# Patient Record
Sex: Female | Born: 1975
Health system: Southern US, Community
[De-identification: ages and names within clinical notes are randomized; demographics above are authoritative.]

## PROBLEM LIST (undated history)

## (undated) DIAGNOSIS — D219 Benign neoplasm of connective and other soft tissue, unspecified: Secondary | ICD-10-CM

## (undated) DIAGNOSIS — K819 Cholecystitis, unspecified: Secondary | ICD-10-CM

## (undated) DIAGNOSIS — F419 Anxiety disorder, unspecified: Secondary | ICD-10-CM

## (undated) DIAGNOSIS — I10 Essential (primary) hypertension: Secondary | ICD-10-CM

## (undated) HISTORY — DX: Essential (primary) hypertension: I10

## (undated) HISTORY — PX: TUBAL LIGATION: SHX77

## (undated) HISTORY — DX: Cholecystitis, unspecified: K81.9

## (undated) HISTORY — DX: Anxiety disorder, unspecified: F41.9

---

## 2002-11-22 ENCOUNTER — Encounter: Admission: RE | Admit: 2002-11-22 | Discharge: 2003-02-20 | Payer: Self-pay | Admitting: Obstetrics and Gynecology

## 2003-02-04 ENCOUNTER — Emergency Department (HOSPITAL_COMMUNITY): Admission: EM | Admit: 2003-02-04 | Discharge: 2003-02-05 | Payer: Self-pay | Admitting: Emergency Medicine

## 2003-04-18 ENCOUNTER — Ambulatory Visit (HOSPITAL_COMMUNITY): Admission: RE | Admit: 2003-04-18 | Discharge: 2003-04-18 | Payer: Self-pay | Admitting: *Deleted

## 2003-07-16 ENCOUNTER — Ambulatory Visit (HOSPITAL_COMMUNITY): Admission: RE | Admit: 2003-07-16 | Discharge: 2003-07-16 | Payer: Self-pay | Admitting: Obstetrics

## 2003-08-24 ENCOUNTER — Ambulatory Visit (HOSPITAL_COMMUNITY): Admission: RE | Admit: 2003-08-24 | Discharge: 2003-08-24 | Payer: Self-pay | Admitting: *Deleted

## 2003-08-27 ENCOUNTER — Encounter: Admission: RE | Admit: 2003-08-27 | Discharge: 2003-08-27 | Payer: Self-pay | Admitting: *Deleted

## 2003-08-31 ENCOUNTER — Inpatient Hospital Stay (HOSPITAL_COMMUNITY): Admission: AD | Admit: 2003-08-31 | Discharge: 2003-08-31 | Payer: Self-pay | Admitting: *Deleted

## 2003-09-05 ENCOUNTER — Encounter: Admission: RE | Admit: 2003-09-05 | Discharge: 2003-09-05 | Payer: Self-pay | Admitting: *Deleted

## 2003-09-05 ENCOUNTER — Ambulatory Visit (HOSPITAL_COMMUNITY): Admission: RE | Admit: 2003-09-05 | Discharge: 2003-09-05 | Payer: Self-pay | Admitting: *Deleted

## 2003-09-12 ENCOUNTER — Ambulatory Visit (HOSPITAL_COMMUNITY): Admission: RE | Admit: 2003-09-12 | Discharge: 2003-09-12 | Payer: Self-pay | Admitting: *Deleted

## 2003-09-12 ENCOUNTER — Encounter: Admission: RE | Admit: 2003-09-12 | Discharge: 2003-09-12 | Payer: Self-pay | Admitting: *Deleted

## 2003-09-18 ENCOUNTER — Ambulatory Visit (HOSPITAL_COMMUNITY): Admission: RE | Admit: 2003-09-18 | Discharge: 2003-09-18 | Payer: Self-pay | Admitting: *Deleted

## 2003-09-18 ENCOUNTER — Encounter: Admission: RE | Admit: 2003-09-18 | Discharge: 2003-09-18 | Payer: Self-pay | Admitting: Obstetrics and Gynecology

## 2003-09-20 ENCOUNTER — Encounter (INDEPENDENT_AMBULATORY_CARE_PROVIDER_SITE_OTHER): Payer: Self-pay | Admitting: *Deleted

## 2003-09-20 ENCOUNTER — Inpatient Hospital Stay (HOSPITAL_COMMUNITY): Admission: EM | Admit: 2003-09-20 | Discharge: 2003-09-23 | Payer: Self-pay | Admitting: *Deleted

## 2003-09-25 ENCOUNTER — Inpatient Hospital Stay (HOSPITAL_COMMUNITY): Admission: AD | Admit: 2003-09-25 | Discharge: 2003-09-25 | Payer: Self-pay | Admitting: *Deleted

## 2004-05-04 ENCOUNTER — Emergency Department (HOSPITAL_COMMUNITY): Admission: EM | Admit: 2004-05-04 | Discharge: 2004-05-04 | Payer: Self-pay | Admitting: Emergency Medicine

## 2006-04-06 ENCOUNTER — Emergency Department (HOSPITAL_COMMUNITY): Admission: EM | Admit: 2006-04-06 | Discharge: 2006-04-06 | Payer: Self-pay | Admitting: Emergency Medicine

## 2009-09-27 ENCOUNTER — Emergency Department (HOSPITAL_COMMUNITY): Admission: EM | Admit: 2009-09-27 | Discharge: 2009-09-27 | Payer: Self-pay | Admitting: Emergency Medicine

## 2010-05-17 LAB — POCT I-STAT, CHEM 8
BUN: 5 mg/dL — ABNORMAL LOW (ref 6–23)
Calcium, Ion: 1.12 mmol/L (ref 1.12–1.32)
Chloride: 107 mEq/L (ref 96–112)
Creatinine, Ser: 0.9 mg/dL (ref 0.4–1.2)
Glucose, Bld: 106 mg/dL — ABNORMAL HIGH (ref 70–99)
HCT: 40 % (ref 36.0–46.0)
Hemoglobin: 13.6 g/dL (ref 12.0–15.0)
Potassium: 3.9 mEq/L (ref 3.5–5.1)
Sodium: 142 mEq/L (ref 135–145)
TCO2: 26 mmol/L (ref 0–100)

## 2010-07-18 NOTE — Op Note (Signed)
Mary Mathis, Mary Mathis                       ACCOUNT NO.:  [id number]   MEDICAL RECORD NO.:  [id number]                   PATIENT TYPE:  INP   LOCATION:  9145                                 FACILITY:  [facility]   PHYSICIAN:  Conni Elliot, M.D.             DATE OF BIRTH:  [birth year]   DATE OF PROCEDURE:  09/20/2003  DATE OF DISCHARGE:  09/23/2003                                 OPERATIVE REPORT   PREOPERATIVE DIAGNOSES:  1. Repeat cesarean delivery.  2. Desire surgical sterilization.  3. Polyhydramnios.   POSTOPERATIVE DIAGNOSES:  1. Repeat cesarean delivery.  2. Desire surgical sterilization.  3. Polyhydramnios.   OPERATION PERFORMED:  1. Low transverse cesarean section.  2. Modified bilateral P[place]oy tubal ligation.   SURGEON:  Conni Elliot, M.D.   ASSISTANTMichele Mcalpine D. Okey Dupre, M.D.   ANESTHESIA:  Spinal.   OPERATIVE FINDINGS:  7 pound 14 ounce female with Apgars 8 and 9.   DESCRIPTION OF PROCEDURE:  After placing the patient supine and given  oxygen, the patient's peritoneum was prepped and draped in sterile fashion.  Low transverse Pfannenstiel incision was made.  Incision was made through  the skin and fascia.  Rectus muscles separated midline, peritoneum entered,  bladder flap made, low transverse uterine incision was made.  Baby was in a  vertex presentation.  Cord doubly clamped and cut and baby handed to  neonatology in attendance.  The  placenta was delivered spontaneously.  Uterus, bladder flap, anterior peritoneum, fascia and subcutaneous skin  closed in an adequate fashion.  Estimated blood loss approximately 800 mL.                                               Conni Elliot, M.D.    ASG/MEDQ  D:  10/16/2003  T:  10/17/2003  Job:  [id number]

## 2010-07-18 NOTE — Discharge Summary (Signed)
Mary Mathis, Mary Mathis                       ACCOUNT NO.:  [id number]   MEDICAL RECORD NO.:  [id number]                   PATIENT TYPE:  INP   LOCATION:  9145                                 FACILITY:  [facility]   PHYSICIAN:  Conni Elliot, M.D.             DATE OF BIRTH:  12-Feb-1976   DATE OF ADMISSION:  09/20/2003  DATE OF DISCHARGE:  09/23/2003                                 DISCHARGE SUMMARY   ADMISSION DIAGNOSES:  70. A 35 year old gravida 4 para 1-0-2-1 at term for a repeat lower     transverse cesarean section.  2. Morbid obesity.   DISCHARGE DIAGNOSES:  33. A 35 year old gravida 4 para 2-0-2-2 postoperative day #3 status post     repeat lower transverse cesarean section and bilateral tubal ligation for     desired sterilization.  2. Viable female with Apgars 8 at one minute and 9 at five minutes.   DISCHARGE MEDICATIONS:  1. Percocet 5/325 one to two p.o. q.4-6h. p.r.n. #30.  2. Ibuprofen 600 mg one p.o. q.6h. p.r.n.  3. Prenatal vitamin one p.o. daily while breastfeeding.   ADMISSION HISTORY:  [name] was admitted to the hospital at term for a  repeat lower transverse C-section with bilateral tubal ligation.  Please see  that operative note.   POSTOPERATIVE COURSE:  [name] had a routine postoperative course.  She  remained afebrile.  Her postoperative hemoglobin was 11.5.  On day of  discharge she was stable but due to her body habitus we will postpone  discontinuing her staples for 2 more days.   CONDITION ON DISCHARGE:  The patient was discharged to home in stable  condition.   INSTRUCTIONS GIVEN TO PATIENT:  The patient was told of her medical regimen.  She is to return to maternity admissions in 2 days for a staple removal and  then 6 weeks to [facility].     Jon Gills, M.D.                     Conni Elliot, M.D.    LC/MEDQ  D:  09/23/2003  T:  09/23/2003  Job:  [id number]

## 2011-01-28 ENCOUNTER — Encounter (HOSPITAL_COMMUNITY): Payer: Self-pay | Admitting: *Deleted

## 2011-01-28 ENCOUNTER — Encounter: Payer: Self-pay | Admitting: *Deleted

## 2011-01-28 ENCOUNTER — Emergency Department (HOSPITAL_COMMUNITY)
Admission: EM | Admit: 2011-01-28 | Discharge: 2011-01-28 | Discharge status: Against Medical Advice | Payer: Self-pay | Attending: Emergency Medicine | Admitting: Emergency Medicine

## 2011-01-28 ENCOUNTER — Emergency Department (HOSPITAL_COMMUNITY): Payer: Self-pay

## 2011-01-28 ENCOUNTER — Emergency Department (HOSPITAL_COMMUNITY)
Admission: EM | Admit: 2011-01-28 | Discharge: 2011-01-28 | Disposition: A | Discharge status: Home / Self Care | Payer: Self-pay | Attending: Emergency Medicine | Admitting: Emergency Medicine

## 2011-01-28 DIAGNOSIS — R197 Diarrhea, unspecified: Secondary | ICD-10-CM | POA: Insufficient documentation

## 2011-01-28 DIAGNOSIS — R109 Unspecified abdominal pain: Secondary | ICD-10-CM | POA: Insufficient documentation

## 2011-01-28 DIAGNOSIS — K8020 Calculus of gallbladder without cholecystitis without obstruction: Secondary | ICD-10-CM | POA: Insufficient documentation

## 2011-01-28 DIAGNOSIS — R112 Nausea with vomiting, unspecified: Secondary | ICD-10-CM | POA: Insufficient documentation

## 2011-01-28 DIAGNOSIS — R1013 Epigastric pain: Secondary | ICD-10-CM | POA: Insufficient documentation

## 2011-01-28 LAB — POCT PREGNANCY, URINE: Preg Test, Ur: NEGATIVE

## 2011-01-28 LAB — CBC
HCT: 40.7 % (ref 36.0–46.0)
Hemoglobin: 13.6 g/dL (ref 12.0–15.0)
MCH: 30 pg (ref 26.0–34.0)
MCHC: 33.4 g/dL (ref 30.0–36.0)
MCV: 89.8 fL (ref 78.0–100.0)
Platelets: 213 10*3/uL (ref 150–400)
RBC: 4.53 MIL/uL (ref 3.87–5.11)
RDW: 12.7 % (ref 11.5–15.5)
WBC: 10.2 10*3/uL (ref 4.0–10.5)

## 2011-01-28 LAB — COMPREHENSIVE METABOLIC PANEL
ALT: 39 U/L — ABNORMAL HIGH (ref 0–35)
AST: 53 U/L — ABNORMAL HIGH (ref 0–37)
Albumin: 3.8 g/dL (ref 3.5–5.2)
Alkaline Phosphatase: 86 U/L (ref 39–117)
BUN: 9 mg/dL (ref 6–23)
CO2: 27 mEq/L (ref 19–32)
Calcium: 9.1 mg/dL (ref 8.4–10.5)
Chloride: 104 mEq/L (ref 96–112)
Creatinine, Ser: 0.85 mg/dL (ref 0.50–1.10)
GFR calc Af Amer: >90 mL/min (ref >90)
GFR calc non Af Amer: 88 mL/min — ABNORMAL LOW (ref >90)
Glucose, Bld: 128 mg/dL — ABNORMAL HIGH (ref 70–99)
Potassium: 3.9 mEq/L (ref 3.5–5.1)
Sodium: 139 mEq/L (ref 135–145)
Total Bilirubin: 0.3 mg/dL (ref 0.3–1.2)
Total Protein: 7.6 g/dL (ref 6.0–8.3)

## 2011-01-28 LAB — URINALYSIS, ROUTINE W REFLEX MICROSCOPIC
Bilirubin Urine: NEGATIVE
Glucose, UA: NEGATIVE mg/dL
Hgb urine dipstick: NEGATIVE
Ketones, ur: NEGATIVE mg/dL
Leukocytes, UA: NEGATIVE
Nitrite: NEGATIVE
Protein, ur: NEGATIVE mg/dL
Specific Gravity, Urine: 1.025 (ref 1.005–1.030)
Urobilinogen, UA: 1 mg/dL (ref 0.0–1.0)
pH: 8 (ref 5.0–8.0)

## 2011-01-28 LAB — DIFFERENTIAL
Basophils Absolute: 0 10*3/uL (ref 0.0–0.1)
Basophils Relative: 0 % (ref 0–1)
Eosinophils Absolute: 0 10*3/uL (ref 0.0–0.7)
Eosinophils Relative: 0 % (ref 0–5)
Lymphocytes Relative: 11 % — ABNORMAL LOW (ref 12–46)
Lymphs Abs: 1.1 10*3/uL (ref 0.7–4.0)
Monocytes Absolute: 0.5 10*3/uL (ref 0.1–1.0)
Monocytes Relative: 5 % (ref 3–12)
Neutro Abs: 8.6 10*3/uL — ABNORMAL HIGH (ref 1.7–7.7)
Neutrophils Relative %: 84 % — ABNORMAL HIGH (ref 43–77)

## 2011-01-28 LAB — LIPASE, BLOOD: Lipase: 58 U/L (ref 11–59)

## 2011-01-28 MED ORDER — ONDANSETRON HCL 4 MG/2ML IJ SOLN
4.0000 mg | Freq: Once | INTRAMUSCULAR | Status: AC
  Administered 2011-01-28: 4 mg via INTRAVENOUS
  Filled 2011-01-28: qty 2

## 2011-01-28 MED ORDER — ONDANSETRON HCL 4 MG PO TABS: ORAL_TABLET | ORAL | Status: DC

## 2011-01-28 MED ORDER — HYDROCODONE-ACETAMINOPHEN 5-500 MG PO TABS: 1.0000 | ORAL_TABLET | Freq: Four times a day (QID) | ORAL | Status: AC | PRN

## 2011-01-28 MED ORDER — FENTANYL CITRATE 0.05 MG/ML IJ SOLN
100.0000 ug | Freq: Once | INTRAMUSCULAR | Status: AC
  Administered 2011-01-28: 100 ug via INTRAVENOUS
  Filled 2011-01-28: qty 2

## 2011-01-28 MED ORDER — SODIUM CHLORIDE 0.9 % IV BOLUS (SEPSIS)
1000.0000 mL | Freq: Once | INTRAVENOUS | Status: AC
  Administered 2011-01-28: 1000 mL via INTRAVENOUS

## 2011-01-28 MED ORDER — HYDROCODONE-ACETAMINOPHEN 5-500 MG PO TABS: 1.0000 | ORAL_TABLET | Freq: Four times a day (QID) | ORAL | Status: DC | PRN

## 2011-01-28 NOTE — ED Notes (Signed)
PT c/o right upper quadrant pain; vomiting. Belly tender but not distended.

## 2011-01-28 NOTE — ED Provider Notes (Signed)
Medical screening examination/treatment/procedure(s) were performed by non-physician practitioner and as supervising physician I was immediately available for consultation/collaboration.  Flint Melter, MD 01/28/11 2107

## 2011-01-28 NOTE — ED Notes (Signed)
C/o abd pain, onset 2 hrs ago, also nv, (denies: bleeding,diarrhea,fever,back pain, urinary or vaginal sx, or other sx), last emesis at 0300, last ate 1930, last BM PTA (normal.

## 2011-01-28 NOTE — ED Notes (Signed)
Pt in c/o epigastric pain and n/v since last night

## 2011-01-28 NOTE — ED Notes (Signed)
Pt reports cannot urinate at this time.  

## 2011-01-28 NOTE — ED Provider Notes (Signed)
History     CSN: [id number] Arrival date & time: 01/28/2011  5:08 AM   None     Chief Complaint  Patient presents with  . Abdominal Pain    (Consider location/radiation/quality/duration/timing/severity/associated sxs/prior treatment) HPI  Patient presents to emergency department with complaint of acute onset epigastric upper Donald pain that began at 3:30 this morning waking her from her sleep. Patient states since onset of pain waxing and waning abdominal pain with pain aggravated by standing and sitting up. Patient notes that since onset of pain 2 episodes of vomiting and a few episodes of loose stool. Also complaining of ongoing nausea. Patient has taken nothing for symptoms prior to arrival. Patient denies history of similar abdominal pain. Patient has history of 2 cesarean sections but denies additional abdominal surgeries. Patient denies fevers, chills, chest pain, shortness breath, dysuria, hematuria, blood in her stool, pelvic pain, dyspareunia, or vaginal discharge. Patient has no known medical problems takes no medicines on a regular basis. LMP Nov 12th as described as normal period.   History reviewed. No pertinent past medical history.  History reviewed. No pertinent past surgical history.  History reviewed. No pertinent family history.  History  Substance Use Topics  . Smoking status: Never Smoker   . Smokeless tobacco: Not on file  . Alcohol Use: No    OB History    Grav Para Term Preterm Abortions TAB SAB Ect Mult Living                  Review of Systems  All other systems reviewed and are negative.    Allergies  Review of patient's allergies indicates no known allergies.  Home Medications  No current outpatient prescriptions on file.  BP 149/99  Pulse 81  Temp(Src) 98.7 F (37.1 C) (Oral)  Resp 17  SpO2 100%  LMP 01/12/2011  Physical Exam  Nursing note and vitals reviewed. Constitutional: She is oriented to person, place, and time. She  appears well-developed and well-nourished. No distress.  HENT:  Head: Normocephalic and atraumatic.  Eyes: Conjunctivae are normal.  Neck: Normal range of motion. Neck supple.  Cardiovascular: Normal rate, regular rhythm, normal heart sounds and intact distal pulses.  Exam reveals no gallop and no friction rub.   No murmur heard. Pulmonary/Chest: Effort normal and breath sounds normal. No respiratory distress. She has no wheezes. She has no rales. She exhibits no tenderness.  Abdominal: Bowel sounds are normal. She exhibits no distension and no mass. There is tenderness in the right upper quadrant and epigastric area. There is no rigidity, no rebound, no guarding and no CVA tenderness.       Moderate tenderness to palpation of right upper quadrant region but no rebound tenderness or peritoneal signs.  Musculoskeletal: Normal range of motion. She exhibits no edema and no tenderness.  Neurological: She is alert and oriented to person, place, and time.  Skin: Skin is warm and dry. No rash noted. She is not diaphoretic. No erythema.  Psychiatric: She has a normal mood and affect. Her behavior is normal.    ED Course  Procedures (including critical care time)  IV zofran, fentanyl and fluids  Labs Reviewed  DIFFERENTIAL - Abnormal; Notable for the following:    Neutrophils Relative 84 (*)    Neutro Abs 8.6 (*)    Lymphocytes Relative 11 (*)    All other components within normal limits  COMPREHENSIVE METABOLIC PANEL - Abnormal; Notable for the following:    Glucose, Bld 128 (*)  AST 53 (*)    ALT 39 (*)    GFR calc non Af Amer 88 (*)    All other components within normal limits  URINALYSIS, ROUTINE W REFLEX MICROSCOPIC - Abnormal; Notable for the following:    Appearance CLOUDY (*)    All other components within normal limits  CBC  LIPASE, BLOOD  POCT PREGNANCY, URINE  POCT PREGNANCY, URINE   US Abdomen Complete  01/28/2011  *RADIOLOGY REPORT*  Clinical Data:  Right upper  quadrant pain  ABDOMINAL ULTRASOUND COMPLETE  Comparison:  None.  Findings:  Gallbladder:  Echogenic shadowing gallstones noted.  Some gallstones appear mobile.  Largest gallstone in the neck region measures 1.2 cm.  There is slight gallbladder wall thickening measuring 5.7 mm.  Small amount of pericholecystic fluid as well. Early cholecystitis not excluded.  Murphy's sign was not elicited over the gallbladder however the patient was apparently medicated prior to the study.  Common Bile Duct:  Within normal limits in caliber.  Liver: No focal mass lesion identified.  Within normal limits in parenchymal echogenicity.  IVC:  Appears normal.  Pancreas:  No abnormality identified.  Spleen:  Within normal limits in size and echotexture.  Right kidney:  Normal in size and parenchymal echogenicity.  No evidence of mass or hydronephrosis.  Left kidney:  Normal in size and parenchymal echogenicity.  No evidence of mass or hydronephrosis.  Abdominal Aorta:  No aneurysm identified.  IMPRESSION: Cholelithiasis with mild gallbladder wall thickening and small amount of pericholecystic fluid, compatible with cholecystitis. Recommend correlation with clinical exam and laboratory data. Murphy's sign not elicited because the patient was medicated prior to the study.  Original Report Authenticated By: Judie Petit. Ruel Favors, M.D.   8:32 AM Is lying comfortably in bed stating resolution of pain feeling much improved. I spoke at length with patient about her ultrasound findings and gallbladder disease. Patient feels comfortable following up with Gen. surgery as an outpatient for consideration of gallbladder removal. Patient is afebrile without increased white count and pain well controlled in ER. Will send patient home with as needed pain medicine and nausea medicine with strict precautions to return to emergency department for increasing pain, fevers, chills, or any other emergent or changing symptoms. Patient is agreeable with plan. I  will consult surgery to give patient information for outpatient followup.  8:44 AM I spoke with Dr. Ezzard Standing who took patient's MRN number for future follow up. He is agreeable to plan.  1. Cholelithiasis       MDM  Afebrile, non toxic appearing. No peritoneal signs's on exam. Pain resolved in ER. Discussed plan with patient and Gen surgery who are both agreeable to OP follow up.        Jenness Corner, [place] 01/28/11 [phone]

## 2011-01-28 NOTE — ED Notes (Signed)
Patient transported to Ultrasound 

## 2011-03-10 ENCOUNTER — Ambulatory Visit (INDEPENDENT_AMBULATORY_CARE_PROVIDER_SITE_OTHER): Payer: Self-pay | Admitting: General Surgery

## 2011-03-31 ENCOUNTER — Ambulatory Visit (INDEPENDENT_AMBULATORY_CARE_PROVIDER_SITE_OTHER): Payer: Self-pay | Admitting: General Surgery

## 2011-04-14 ENCOUNTER — Ambulatory Visit (INDEPENDENT_AMBULATORY_CARE_PROVIDER_SITE_OTHER): Payer: PRIVATE HEALTH INSURANCE | Admitting: Surgery

## 2011-04-14 ENCOUNTER — Encounter (INDEPENDENT_AMBULATORY_CARE_PROVIDER_SITE_OTHER): Payer: Self-pay | Admitting: Surgery

## 2011-04-14 ENCOUNTER — Encounter (INDEPENDENT_AMBULATORY_CARE_PROVIDER_SITE_OTHER): Payer: Self-pay | Admitting: General Surgery

## 2011-04-14 VITALS — BP 140/90 | HR 70 | Temp 97.8°F | Resp 18 | Ht 66.5 in | Wt 306.2 lb

## 2011-04-14 DIAGNOSIS — K8020 Calculus of gallbladder without cholecystitis without obstruction: Secondary | ICD-10-CM

## 2011-04-14 NOTE — Progress Notes (Signed)
Patient ID: Mary Mathis, female   DOB: 11/30/1975, 36 y.o.   MRN: [id number]  Chief Complaint  Patient presents with  . New Evaluation    gallstones    HPI Mary Mathis is a 36 y.o. female.   HPIThis is a pleasant female referred by the emergency department after an episode of right upper quadrant abdominal pain nausea and vomiting. This occurred at the end of November 2012. Since then she has had mild attacks. Her first attack resulted a sharp right upper quadrant pain that referred through to the back. She also had nausea and vomiting. Since then, the attacks have been much smaller. She denies any jaundice. She has no other complaints  Past Medical History  Diagnosis Date  . Cholecystitis     History reviewed. No pertinent past surgical history.  History reviewed. No pertinent family history.  Social History History  Substance Use Topics  . Smoking status: Never Smoker   . Smokeless tobacco: Never Used  . Alcohol Use: No    No Known Allergies  No current outpatient prescriptions on file.    Review of Systems Review of Systems  Constitutional: Negative for fever, chills and unexpected weight change.  HENT: Negative for hearing loss, congestion, sore throat, trouble swallowing and voice change.   Eyes: Negative for visual disturbance.  Respiratory: Negative for cough and wheezing.   Cardiovascular: Negative for chest pain, palpitations and leg swelling.  Gastrointestinal: Positive for nausea, vomiting and abdominal pain. Negative for diarrhea, constipation, blood in stool, abdominal distention and anal bleeding.  Genitourinary: Negative for hematuria, vaginal bleeding and difficulty urinating.  Musculoskeletal: Negative for arthralgias.  Skin: Negative for rash and wound.  Neurological: Negative for seizures, syncope and headaches.  Hematological: Negative for adenopathy. Does not bruise/bleed easily.  Psychiatric/Behavioral: Negative for confusion.    Blood  pressure 140/90, pulse 70, temperature 97.8 F (36.6 C), temperature source Temporal, resp. rate 18, height 5' 6.5" (1.689 m), weight 306 lb 3.2 oz (138.891 kg).  Physical Exam Physical Exam  Constitutional: She is oriented to person, place, and time. She appears well-developed and well-nourished. No distress.  HENT:  Head: Normocephalic and atraumatic.  Right Ear: External ear normal.  Left Ear: External ear normal.  Nose: Nose normal.  Mouth/Throat: Oropharynx is clear and moist.  Eyes: Conjunctivae and EOM are normal. Pupils are equal, round, and reactive to light. Right eye exhibits no discharge. Left eye exhibits no discharge. No scleral icterus.  Neck: Normal range of motion. Neck supple. No tracheal deviation present. No thyromegaly present.  Cardiovascular: Normal rate, regular rhythm, normal heart sounds and intact distal pulses.   No murmur heard. Pulmonary/Chest: Breath sounds normal. No respiratory distress. She has no wheezes. She has no rales.  Abdominal: Soft. Bowel sounds are normal. She exhibits no distension and no mass. There is no tenderness. There is no rebound and no guarding.  Musculoskeletal: Normal range of motion. She exhibits no edema and no tenderness.  Lymphadenopathy:    She has no cervical adenopathy.  Neurological: She is alert and oriented to person, place, and time.  Skin: Skin is warm and dry. No rash noted. She is not diaphoretic. No erythema.  Psychiatric: Her behavior is normal. Judgment normal.    Data Reviewed I reviewed the patient's ultrasound from November showing multiple gallstones and a thickened gallbladder wall with pericholecystic fluid. The bile duct was normal. At that time, her white blood count was normal. Her bilirubin and alkaline phosphatase were normal but   her AST and ALT were mildly elevated  Assessment    Patient with symptomatic cholelithiasis and I suspect chronic cholecystitis    Plan    Laparoscopic cholecystectomy  with possible cholangiogram is recommended. I discussed this with her in detail and gave her literature regarding this. I discussed the risks of surgery which includes but is not limited to bleeding, infection, bile duct injury, bile leak, need to convert to an open procedure, injury to other structures, cardiopulmonary issues, et cetera. She understands and wishes to proceed. Likelihood of success is good       Mary Mathis A 04/14/2011, 10:44 AM    

## 2011-04-28 ENCOUNTER — Encounter (HOSPITAL_COMMUNITY): Payer: Self-pay

## 2011-04-29 ENCOUNTER — Encounter (HOSPITAL_COMMUNITY)
Admission: RE | Admit: 2011-04-29 | Discharge: 2011-04-29 | Disposition: A | Discharge status: Home / Self Care | Payer: No Typology Code available for payment source | Source: Ambulatory Visit | Attending: Surgery | Admitting: Surgery

## 2011-04-29 ENCOUNTER — Encounter (HOSPITAL_COMMUNITY): Payer: Self-pay

## 2011-04-29 LAB — CBC
HCT: 38.3 % (ref 36.0–46.0)
Hemoglobin: 12.7 g/dL (ref 12.0–15.0)
MCH: 29.5 pg (ref 26.0–34.0)
MCHC: 33.2 g/dL (ref 30.0–36.0)
MCV: 88.9 fL (ref 78.0–100.0)
RDW: 12.5 % (ref 11.5–15.5)

## 2011-04-29 LAB — SURGICAL PCR SCREEN: Staphylococcus aureus: NEGATIVE

## 2011-04-29 LAB — BASIC METABOLIC PANEL
BUN: 10 mg/dL (ref 6–23)
Calcium: 8.7 mg/dL (ref 8.4–10.5)
Creatinine, Ser: 0.82 mg/dL (ref 0.50–1.10)
GFR calc Af Amer: >90 mL/min (ref >90)
GFR calc non Af Amer: >90 mL/min (ref >90)
Glucose, Bld: 94 mg/dL (ref 70–99)

## 2011-04-29 NOTE — Patient Instructions (Signed)
20 Mary Mathis  04/29/2011   Your procedure is scheduled on: Monday 3/4  AT  10:00 AM  Report to [facility] at 7:30 AM.  Call this number if you have problems the morning of surgery: [phone]   Remember:   Do not eat food OR DRINK ANYTHING AFTER MIDNIGHT THE NIGHT BEFORE YOUR SURGERY       Do not wear jewelry, make-up or nail polish.  Do not wear lotions, powders, or perfumes.  Do not shave 48 hours prior to surgery.  Do not bring valuables to the hospital.  Contacts, dentures or bridgework may not be worn into surgery.  Leave suitcase in the car. After surgery it may be brought to your room.  For patients admitted to the hospital, checkout time is 11:00 AM the day of discharge.   Patients discharged the day of surgery will not be allowed to drive home.    Special Instructions: CHG Shower Use Special Wash: 1/2 bottle night before surgery and 1/2 bottle morning of surgery.   Please read over the following fact sheets that you were given: MRSA Information

## 2011-05-04 ENCOUNTER — Ambulatory Visit (HOSPITAL_COMMUNITY): Payer: No Typology Code available for payment source | Admitting: Certified Registered Nurse Anesthetist

## 2011-05-04 ENCOUNTER — Encounter (HOSPITAL_COMMUNITY): Payer: Self-pay | Admitting: Certified Registered Nurse Anesthetist

## 2011-05-04 ENCOUNTER — Ambulatory Visit (HOSPITAL_COMMUNITY): Payer: No Typology Code available for payment source

## 2011-05-04 ENCOUNTER — Encounter (HOSPITAL_COMMUNITY)
Admission: RE | Disposition: A | Discharge status: Home / Self Care | Payer: Self-pay | Source: Ambulatory Visit | Attending: Surgery

## 2011-05-04 ENCOUNTER — Encounter (HOSPITAL_COMMUNITY): Payer: Self-pay

## 2011-05-04 ENCOUNTER — Encounter (HOSPITAL_COMMUNITY): Payer: Self-pay | Admitting: *Deleted

## 2011-05-04 ENCOUNTER — Ambulatory Visit (HOSPITAL_COMMUNITY)
Admission: RE | Admit: 2011-05-04 | Discharge: 2011-05-04 | Disposition: A | Discharge status: Home / Self Care | Payer: No Typology Code available for payment source | Source: Ambulatory Visit | Attending: Surgery | Admitting: Surgery

## 2011-05-04 DIAGNOSIS — K8010 Calculus of gallbladder with chronic cholecystitis without obstruction: Secondary | ICD-10-CM | POA: Insufficient documentation

## 2011-05-04 DIAGNOSIS — Z01812 Encounter for preprocedural laboratory examination: Secondary | ICD-10-CM | POA: Insufficient documentation

## 2011-05-04 HISTORY — PX: CHOLECYSTECTOMY: SHX55

## 2011-05-04 HISTORY — PX: INTRAOPERATIVE CHOLANGIOGRAM: SHX5230

## 2011-05-04 SURGERY — LAPAROSCOPIC CHOLECYSTECTOMY
Anesthesia: General | Site: Abdomen | Wound class: Contaminated

## 2011-05-04 MED ORDER — OXYCODONE HCL 5 MG PO TABS: 5.0000 mg | ORAL_TABLET | ORAL | Status: DC | PRN

## 2011-05-04 MED ORDER — BUPIVACAINE-EPINEPHRINE PF 0.25-1:200000 % IJ SOLN
INTRAMUSCULAR | Status: AC
  Filled 2011-05-04: qty 30

## 2011-05-04 MED ORDER — CEFAZOLIN SODIUM-DEXTROSE 2-3 GM-% IV SOLR
2.0000 g | INTRAVENOUS | Status: AC
  Administered 2011-05-04: 2 g via INTRAVENOUS

## 2011-05-04 MED ORDER — ONDANSETRON HCL 4 MG/2ML IJ SOLN
INTRAMUSCULAR | Status: DC | PRN
  Administered 2011-05-04: 4 mg via INTRAVENOUS

## 2011-05-04 MED ORDER — NEOSTIGMINE METHYLSULFATE 1 MG/ML IJ SOLN
INTRAMUSCULAR | Status: DC | PRN
  Administered 2011-05-04 (×2): 2.5 mg via INTRAVENOUS

## 2011-05-04 MED ORDER — MORPHINE SULFATE 10 MG/ML IJ SOLN: 4.0000 mg | INTRAMUSCULAR | Status: DC | PRN

## 2011-05-04 MED ORDER — CISATRACURIUM BESYLATE 2 MG/ML IV SOLN
INTRAVENOUS | Status: DC | PRN
  Administered 2011-05-04: 6 mg via INTRAVENOUS

## 2011-05-04 MED ORDER — DIATRIZOATE MEGLUMINE 30 % UR SOLN
URETHRAL | Status: DC | PRN
  Administered 2011-05-04: 4 mL

## 2011-05-04 MED ORDER — FENTANYL CITRATE 0.05 MG/ML IJ SOLN
INTRAMUSCULAR | Status: AC
  Filled 2011-05-04: qty 2

## 2011-05-04 MED ORDER — SODIUM CHLORIDE 0.9 % IV SOLN: 250.0000 mL | INTRAVENOUS | Status: DC | PRN

## 2011-05-04 MED ORDER — LIDOCAINE HCL 1 % IJ SOLN
INTRAMUSCULAR | Status: DC | PRN
  Administered 2011-05-04: 50 mg via INTRADERMAL

## 2011-05-04 MED ORDER — LACTATED RINGERS IV SOLN
INTRAVENOUS | Status: DC
  Administered 2011-05-04: 10:00:00 via INTRAVENOUS
  Administered 2011-05-04: 1000 mL via INTRAVENOUS

## 2011-05-04 MED ORDER — 0.9 % SODIUM CHLORIDE (POUR BTL) OPTIME
TOPICAL | Status: DC | PRN
  Administered 2011-05-04: 4 mL

## 2011-05-04 MED ORDER — ACETAMINOPHEN 650 MG RE SUPP
650.0000 mg | RECTAL | Status: DC | PRN
  Filled 2011-05-04: qty 1

## 2011-05-04 MED ORDER — SUCCINYLCHOLINE CHLORIDE 20 MG/ML IJ SOLN
INTRAMUSCULAR | Status: DC | PRN
  Administered 2011-05-04: 160 mg via INTRAVENOUS

## 2011-05-04 MED ORDER — LACTATED RINGERS IV SOLN: INTRAVENOUS | Status: DC

## 2011-05-04 MED ORDER — LACTATED RINGERS IV SOLN
INTRAVENOUS | Status: DC | PRN
  Administered 2011-05-04: 1000 mL

## 2011-05-04 MED ORDER — ONDANSETRON HCL 4 MG/2ML IJ SOLN: 4.0000 mg | Freq: Four times a day (QID) | INTRAMUSCULAR | Status: DC | PRN

## 2011-05-04 MED ORDER — HYDROCODONE-ACETAMINOPHEN 5-325 MG PO TABS: 1.0000 | ORAL_TABLET | ORAL | Status: AC | PRN

## 2011-05-04 MED ORDER — IOHEXOL 300 MG/ML  SOLN
INTRAMUSCULAR | Status: AC
  Filled 2011-05-04: qty 1

## 2011-05-04 MED ORDER — MIDAZOLAM HCL 5 MG/5ML IJ SOLN
INTRAMUSCULAR | Status: DC | PRN
  Administered 2011-05-04: 2 mg via INTRAVENOUS

## 2011-05-04 MED ORDER — SODIUM CHLORIDE 0.9 % IV SOLN: INTRAVENOUS | Status: DC

## 2011-05-04 MED ORDER — SODIUM CHLORIDE 0.9 % IJ SOLN: 3.0000 mL | INTRAMUSCULAR | Status: DC | PRN

## 2011-05-04 MED ORDER — FENTANYL CITRATE 0.05 MG/ML IJ SOLN
INTRAMUSCULAR | Status: DC | PRN
  Administered 2011-05-04: 50 ug via INTRAVENOUS
  Administered 2011-05-04 (×2): 100 ug via INTRAVENOUS

## 2011-05-04 MED ORDER — ACETAMINOPHEN 10 MG/ML IV SOLN
INTRAVENOUS | Status: AC
  Filled 2011-05-04: qty 100

## 2011-05-04 MED ORDER — ACETAMINOPHEN 325 MG PO TABS: 650.0000 mg | ORAL_TABLET | ORAL | Status: DC | PRN

## 2011-05-04 MED ORDER — OXYCODONE HCL 5 MG PO TABS
ORAL_TABLET | ORAL | Status: AC
  Administered 2011-05-04: 5 mg
  Filled 2011-05-04: qty 1

## 2011-05-04 MED ORDER — SODIUM CHLORIDE 0.9 % IJ SOLN: 3.0000 mL | Freq: Two times a day (BID) | INTRAMUSCULAR | Status: DC

## 2011-05-04 MED ORDER — ACETAMINOPHEN 10 MG/ML IV SOLN
INTRAVENOUS | Status: DC | PRN
  Administered 2011-05-04: 1000 mg via INTRAVENOUS

## 2011-05-04 MED ORDER — CEFAZOLIN SODIUM-DEXTROSE 2-3 GM-% IV SOLR
INTRAVENOUS | Status: AC
  Filled 2011-05-04: qty 50

## 2011-05-04 MED ORDER — GLYCOPYRROLATE 0.2 MG/ML IJ SOLN
INTRAMUSCULAR | Status: DC | PRN
  Administered 2011-05-04 (×2): .5 mg via INTRAVENOUS

## 2011-05-04 MED ORDER — FENTANYL CITRATE 0.05 MG/ML IJ SOLN
25.0000 ug | INTRAMUSCULAR | Status: DC | PRN
  Administered 2011-05-04 (×2): 25 ug via INTRAVENOUS

## 2011-05-04 MED ORDER — PROPOFOL 10 MG/ML IV BOLUS
INTRAVENOUS | Status: DC | PRN
  Administered 2011-05-04: 200 mg via INTRAVENOUS

## 2011-05-04 SURGICAL SUPPLY — 35 items
APPLIER CLIP 5 13 M/L LIGAMAX5 (MISCELLANEOUS)
BANDAGE ADHESIVE 1X3 (GAUZE/BANDAGES/DRESSINGS) ×8 IMPLANT
BENZOIN TINCTURE PRP APPL 2/3 (GAUZE/BANDAGES/DRESSINGS) ×2 IMPLANT
CANISTER SUCTION 2500CC (MISCELLANEOUS) ×2 IMPLANT
CHLORAPREP W/TINT 26ML (MISCELLANEOUS) ×2 IMPLANT
CLIP APPLIE 5 13 M/L LIGAMAX5 (MISCELLANEOUS) IMPLANT
CLOSURE STERI STRIP 1/2 X4 (GAUZE/BANDAGES/DRESSINGS) ×2 IMPLANT
CLOTH BEACON ORANGE TIMEOUT ST (SAFETY) ×2 IMPLANT
COVER MAYO STAND STRL (DRAPES) IMPLANT
DECANTER SPIKE VIAL GLASS SM (MISCELLANEOUS) IMPLANT
DRAPE C-ARM 42X72 X-RAY (DRAPES) IMPLANT
DRAPE LAPAROSCOPIC ABDOMINAL (DRAPES) ×2 IMPLANT
ELECT REM PT RETURN 9FT ADLT (ELECTROSURGICAL) ×2
ELECTRODE REM PT RTRN 9FT ADLT (ELECTROSURGICAL) ×1 IMPLANT
GLOVE BIOGEL PI IND STRL 7.0 (GLOVE) ×1 IMPLANT
GLOVE BIOGEL PI INDICATOR 7.0 (GLOVE) ×1
GLOVE SURG SIGNA 7.5 PF LTX (GLOVE) ×4 IMPLANT
GOWN STRL NON-REIN LRG LVL3 (GOWN DISPOSABLE) ×2 IMPLANT
GOWN STRL REIN XL XLG (GOWN DISPOSABLE) ×4 IMPLANT
HEMOSTAT SURGICEL 4X8 (HEMOSTASIS) IMPLANT
KIT BASIN OR (CUSTOM PROCEDURE TRAY) ×2 IMPLANT
NS IRRIG 1000ML POUR BTL (IV SOLUTION) IMPLANT
POUCH SPECIMEN RETRIEVAL 10MM (ENDOMECHANICALS) IMPLANT
SET CHOLANGIOGRAPH MIX (MISCELLANEOUS) IMPLANT
SET IRRIG TUBING LAPAROSCOPIC (IRRIGATION / IRRIGATOR) ×2 IMPLANT
SOLUTION ANTI FOG 6CC (MISCELLANEOUS) ×2 IMPLANT
SPONGE GAUZE 4X4 12PLY (GAUZE/BANDAGES/DRESSINGS) ×2 IMPLANT
STRIP CLOSURE SKIN 1/2X4 (GAUZE/BANDAGES/DRESSINGS) ×2 IMPLANT
SUT MNCRL AB 4-0 PS2 18 (SUTURE) ×2 IMPLANT
TAPE CLOTH SURG 4X10 WHT LF (GAUZE/BANDAGES/DRESSINGS) ×2 IMPLANT
TOWEL OR 17X26 10 PK STRL BLUE (TOWEL DISPOSABLE) ×2 IMPLANT
TRAY LAP CHOLE (CUSTOM PROCEDURE TRAY) ×2 IMPLANT
TROCAR BLADELESS OPT 5 75 (ENDOMECHANICALS) ×6 IMPLANT
TROCAR XCEL BLUNT TIP 100MML (ENDOMECHANICALS) ×2 IMPLANT
TUBING INSUFFLATION 10FT LAP (TUBING) ×2 IMPLANT

## 2011-05-04 NOTE — H&P (View-Only) (Signed)
Patient ID: Mary Mathis, female   DOB: 01-Mar-1976, 36 y.o.   MRN: [id number]  Chief Complaint  Patient presents with  . New Evaluation    gallstones    HPI Mary Mathis is a 36 y.o. female.   HPIThis is a pleasant female referred by the emergency department after an episode of right upper quadrant abdominal pain nausea and vomiting. This occurred at the end of November 2012. Since then she has had mild attacks. Her first attack resulted a sharp right upper quadrant pain that referred through to the back. She also had nausea and vomiting. Since then, the attacks have been much smaller. She denies any jaundice. She has no other complaints  Past Medical History  Diagnosis Date  . Cholecystitis     History reviewed. No pertinent past surgical history.  History reviewed. No pertinent family history.  Social History History  Substance Use Topics  . Smoking status: Never Smoker   . Smokeless tobacco: Never Used  . Alcohol Use: No    No Known Allergies  No current outpatient prescriptions on file.    Review of Systems Review of Systems  Constitutional: Negative for fever, chills and unexpected weight change.  HENT: Negative for hearing loss, congestion, sore throat, trouble swallowing and voice change.   Eyes: Negative for visual disturbance.  Respiratory: Negative for cough and wheezing.   Cardiovascular: Negative for chest pain, palpitations and leg swelling.  Gastrointestinal: Positive for nausea, vomiting and abdominal pain. Negative for diarrhea, constipation, blood in stool, abdominal distention and anal bleeding.  Genitourinary: Negative for hematuria, vaginal bleeding and difficulty urinating.  Musculoskeletal: Negative for arthralgias.  Skin: Negative for rash and wound.  Neurological: Negative for seizures, syncope and headaches.  Hematological: Negative for adenopathy. Does not bruise/bleed easily.  Psychiatric/Behavioral: Negative for confusion.    Blood  pressure 140/90, pulse 70, temperature 97.8 F (36.6 C), temperature source Temporal, resp. rate 18, height 5' 6.5" (1.689 m), weight 306 lb 3.2 oz (138.891 kg).  Physical Exam Physical Exam  Constitutional: She is oriented to person, place, and time. She appears well-developed and well-nourished. No distress.  HENT:  Head: Normocephalic and atraumatic.  Right Ear: External ear normal.  Left Ear: External ear normal.  Nose: Nose normal.  Mouth/Throat: Oropharynx is clear and moist.  Eyes: Conjunctivae and EOM are normal. Pupils are equal, round, and reactive to light. Right eye exhibits no discharge. Left eye exhibits no discharge. No scleral icterus.  Neck: Normal range of motion. Neck supple. No tracheal deviation present. No thyromegaly present.  Cardiovascular: Normal rate, regular rhythm, normal heart sounds and intact distal pulses.   No murmur heard. Pulmonary/Chest: Breath sounds normal. No respiratory distress. She has no wheezes. She has no rales.  Abdominal: Soft. Bowel sounds are normal. She exhibits no distension and no mass. There is no tenderness. There is no rebound and no guarding.  Musculoskeletal: Normal range of motion. She exhibits no edema and no tenderness.  Lymphadenopathy:    She has no cervical adenopathy.  Neurological: She is alert and oriented to person, place, and time.  Skin: Skin is warm and dry. No rash noted. She is not diaphoretic. No erythema.  Psychiatric: Her behavior is normal. Judgment normal.    Data Reviewed I reviewed the patient's ultrasound from November showing multiple gallstones and a thickened gallbladder wall with pericholecystic fluid. The bile duct was normal. At that time, her white blood count was normal. Her bilirubin and alkaline phosphatase were normal but  her AST and ALT were mildly elevated  Assessment    Patient with symptomatic cholelithiasis and I suspect chronic cholecystitis    Plan    Laparoscopic cholecystectomy  with possible cholangiogram is recommended. I discussed this with her in detail and gave her literature regarding this. I discussed the risks of surgery which includes but is not limited to bleeding, infection, bile duct injury, bile leak, need to convert to an open procedure, injury to other structures, cardiopulmonary issues, et Karie Soda. She understands and wishes to proceed. Likelihood of success is good       Rozalyn Osland A 04/14/2011, 10:44 AM

## 2011-05-04 NOTE — Transfer of Care (Signed)
Immediate Anesthesia Transfer of Care Note  Patient: Mary Mathis  Procedure(s) Performed: Procedure(s) (LRB): LAPAROSCOPIC CHOLECYSTECTOMY (N/A) INTRAOPERATIVE CHOLANGIOGRAM (N/A)  Patient Location: PACU  Anesthesia Type: General  Level of Consciousness: awake, alert  and oriented  Airway & Oxygen Therapy: Patient Spontanous Breathing and Patient connected to face mask oxygen  Post-op Assessment: Report given to PACU RN  Post vital signs: Reviewed and stable  Complications: No apparent anesthesia complications

## 2011-05-04 NOTE — Op Note (Signed)
Preoperative diagnosis: symptomatic cholelithiasis  Postoperative diagnosis: sam3  Procedure: Laparoscopic cholecystectomy with cholangiogram.  Surgeon: Christell Constant. Magnus Ivan, M.D.  Asst.:  0  Anesthesia: Gen.  Indication:   Gallstones  Findings:  Chronically thickened gallbladder with gallstones.  c-gram normal  Technique: The patient was brought to the operating room, placed supine on the operating table, and a general anesthetic was administered. The hair on the abdominal wall was clipped as was necessary. The abdominal wall was then sterilely prepped and draped. Local anesthetic (Marcaine) was infiltrated in the subumbilical region. A small subumbilical incision was made through the skin, subcutaneous tissue, fascia, and peritoneum entering the peritoneal cavity under direct vision. A pursestring suture of 0 Vicryl was placed around the edges of the fascia. A Hassan trocar was introduced into the peritoneal cavity and a pneumoperitoneum was created by insufflation of carbon dioxide gas. The laparoscope was introduced into the trocar and no underlying bleeding or organ injury was noted. The patient was then placed in the reverse Trendelenburg position with the right side tilted slightly up.  Three more trochars were then placed into the abdominal cavity under laparoscopic vision. One in the epigastric area, and 2 in the right upper quadrant area. The gallbladder was visualized and the fundus was grasped and retracted toward the right shoulder.  The infundibulum was mobilized with dissection close to the gallbladder and retracted laterally. The cystic duct was identified and a window was created around it. The cystic artery was also identified and a window was created around it. The critical view was achieved. A clip was placed at the neck of the gallbladder. A small incision was made in the cystic duct. A cholangiocatheter was introduced through the anterior abdominal wall and placed in the cystic  duct. A intraoperative cholangiogram was then performed.  Under real-time fluoroscopy, dilute contrast was injected into the cystic duct.  The common hepatic duct, the right and left hepatic ducts, and the common duct were all visualized. Contrast drained into the duodenum without obvious evidence of any obstructing ductal lesion. The final report is pending the Radiologist's interpretation.  The cholangiocatheter was removed, the cystic duct was clipped 3 times on the biliary side, and then the cystic duct was divided sharply. No bile leak was noted from the cystic duct stump.  The cystic artery was then clipped and divided. Following this the gallbladder was dissected free from the liver using electrocautery. The gallbladder was then placed in a retrieval bag and removed from the abdominal cavity through the subumbilical incision.  The gallbladder fossa was inspected, irrigated, and bleeding was controlled with electrocautery. Inspection showed that hemostasis was adequate and there was no evidence of bile leak.  The irrigation fluid was evacuated as much as possible.  The subumbilical trocar was removed and the fascial defect was closed by tightening and tying down the pursestring suture under laparoscopic vision.  The remaining trochars were removed and the pneumoperitoneum was released. The skin incisions were closed with 4-0 Monocryl subcuticular stitches. Steri-Strips and sterile dressings were applied.  The procedure was well-tolerated without any apparent complications. The patient was taken to the recovery room in satisfactory condition.

## 2011-05-04 NOTE — Discharge Instructions (Signed)
[facility] ______[facility] LAPAROSCOPIC SURGERY: POST OP INSTRUCTIONS Always review your discharge instruction sheet given to you by the facility where your surgery was performed. IF YOU HAVE DISABILITY OR FAMILY LEAVE FORMS, YOU MUST BRING THEM TO THE OFFICE FOR PROCESSING.   DO NOT GIVE THEM TO YOUR DOCTOR.  1. A prescription for pain medication may be given to you upon discharge.  Take your pain medication as prescribed, if needed.  If narcotic pain medicine is not needed, then you may take acetaminophen (Tylenol) or ibuprofen (Advil) as needed. 2. Take your usually prescribed medications unless otherwise directed. 3. If you need a refill on your pain medication, please contact your pharmacy.  They will contact our office to request authorization. Prescriptions will not be filled after 5pm or on week-ends. 4. You should follow a light diet the first few days after arrival home, such as soup and crackers, etc.  Be sure to include lots of fluids daily. 5. Most patients will experience some swelling and bruising in the area of the incisions.  Ice packs will help.  Swelling and bruising can take several days to resolve.  6. It is common to experience some constipation if taking pain medication after surgery.  Increasing fluid intake and taking a stool softener (such as Colace) will usually help or prevent this problem from occurring.  A mild laxative (Milk of Magnesia or Miralax) should be taken according to package instructions if there are no bowel movements after 48 hours. 7. Unless discharge instructions indicate otherwise, you may remove your bandages 24-48 hours after surgery, and you may shower at that time.  You may have steri-strips (small skin tapes) in place directly over the incision.  These strips should be left on the skin for 7-10 days.  If your surgeon used skin glue on the incision, you may shower in 24 hours.  The glue will flake off over the next 2-3 weeks.  Any sutures or  staples will be removed at the office during your follow-up visit. 8. ACTIVITIES:  You may resume regular (light) daily activities beginning the next day--such as daily self-care, walking, climbing stairs--gradually increasing activities as tolerated.  You may have sexual intercourse when it is comfortable.  Refrain from any heavy lifting or straining until approved by your doctor. a. You may drive when you are no longer taking prescription pain medication, you can comfortably wear a seatbelt, and you can safely maneuver your car and apply brakes. b. RETURN TO WORK:  ________________________ you should stay out of work 1 to 2 weeks__________________________________ 9. You should see your doctor in the office for a follow-up appointment approximately 2-3 weeks after your surgery.  Make sure that you call for this appointment within a day or two after you arrive home to insure a convenient appointment time. 10. OTHER INSTRUCTIONS: __________________________________________________________________________________________________________________________ __________________________________________________________________________________________________________________________ WHEN TO CALL YOUR DOCTOR: 1. Fever over 101.0 2. Inability to urinate 3. Continued bleeding from incision. 4. Increased pain, redness, or drainage from the incision. 5. Increasing abdominal pain  The clinic staff is available to answer your questions during regular business hours.  Please don't hesitate to call and ask to speak to one of the nurses for clinical concerns.  If you have a medical emergency, go to the nearest emergency room or call 911.  A surgeon from [facility] is always on call at the hospital. [address], [address], [place], [place]  [address] ? [address] [address], [place], [place]   [address] (  336) [phone] ? [phone] ? FAX [phone] Web site: [email or web address]

## 2011-05-04 NOTE — Anesthesia Postprocedure Evaluation (Signed)
Anesthesia Post Note  Patient: Mary Mathis  Procedure(s) Performed: Procedure(s) (LRB): LAPAROSCOPIC CHOLECYSTECTOMY (N/A) INTRAOPERATIVE CHOLANGIOGRAM (N/A)  Anesthesia type: General  Patient location: PACU  Post pain: Pain level controlled  Post assessment: Post-op Vital signs reviewed  Last Vitals:  Filed Vitals:   05/04/11 1045  BP: 166/93  Pulse: 78  Temp:   Resp: 29    Post vital signs: Reviewed  Level of consciousness: sedated  Complications: No apparent anesthesia complications

## 2011-05-04 NOTE — Interval H&P Note (Signed)
History and Physical Interval Note:  She has had no change in her history or exam  05/04/2011 8:54 AM  Mary Mathis  has presented today for surgery, with the diagnosis of gallstones  The various methods of treatment have been discussed with the patient and family. After consideration of risks, benefits and other options for treatment, the patient has consented to  Procedure(s) (LRB): LAPAROSCOPIC CHOLECYSTECTOMY (N/A) INTRAOPERATIVE CHOLANGIOGRAM (N/A) as a surgical intervention .  The patients' history has been reviewed, patient examined, no change in status, stable for surgery.  I have reviewed the patients' chart and labs.  Questions were answered to the patient's satisfaction.     Nashid Pellum A

## 2011-05-04 NOTE — Anesthesia Preprocedure Evaluation (Addendum)
Anesthesia Evaluation  Patient identified by MRN, date of birth, ID band Patient awake    Reviewed: Allergy & Precautions, H&P , NPO status , Patient's Chart, lab work & pertinent test results  Airway Mallampati: II TM Distance: >3 FB Neck ROM: Full    Dental  (+) Teeth Intact and Dental Advisory Given   Pulmonary neg pulmonary ROS,  breath sounds clear to auscultation  Pulmonary exam normal       Cardiovascular negative cardio ROS  Rhythm:Regular     Neuro/Psych negative neurological ROS  negative psych ROS   GI/Hepatic negative GI ROS, Neg liver ROS,   Endo/Other  Morbid obesity  Renal/GU negative Renal ROS  negative genitourinary   Musculoskeletal negative musculoskeletal ROS (+)   Abdominal (+) + obese,   Peds negative pediatric ROS (+)  Hematology negative hematology ROS (+)   Anesthesia Other Findings   Reproductive/Obstetrics negative OB ROS                          Anesthesia Physical Anesthesia Plan  ASA: II  Anesthesia Plan: General   Post-op Pain Management:    Induction: Intravenous  Airway Management Planned: Oral ETT  Additional Equipment:   Intra-op Plan:   Post-operative Plan: Extubation in OR  Informed Consent: I have reviewed the patients History and Physical, chart, labs and discussed the procedure including the risks, benefits and alternatives for the proposed anesthesia with the patient or authorized representative who has indicated his/her understanding and acceptance.   Dental advisory given  Plan Discussed with: CRNA  Anesthesia Plan Comments:         Anesthesia Quick Evaluation

## 2011-05-05 ENCOUNTER — Telehealth (INDEPENDENT_AMBULATORY_CARE_PROVIDER_SITE_OTHER): Payer: Self-pay | Admitting: General Surgery

## 2011-05-05 NOTE — Telephone Encounter (Signed)
Patient called status post surgery by Dr Magnus Ivan yesterday. States someone called and left a message but she deleted it and does know who called her. If you called please call patient back.

## 2011-05-18 ENCOUNTER — Encounter (HOSPITAL_COMMUNITY): Payer: Self-pay | Admitting: Surgery

## 2011-05-26 ENCOUNTER — Ambulatory Visit (INDEPENDENT_AMBULATORY_CARE_PROVIDER_SITE_OTHER): Payer: PRIVATE HEALTH INSURANCE | Admitting: Surgery

## 2011-05-26 ENCOUNTER — Encounter (INDEPENDENT_AMBULATORY_CARE_PROVIDER_SITE_OTHER): Payer: Self-pay | Admitting: Surgery

## 2011-05-26 VITALS — BP 138/88 | HR 80 | Temp 97.4°F | Resp 14 | Ht 66.0 in | Wt 305.8 lb

## 2011-05-26 DIAGNOSIS — Z09 Encounter for follow-up examination after completed treatment for conditions other than malignant neoplasm: Secondary | ICD-10-CM

## 2011-05-26 NOTE — Progress Notes (Signed)
Subjective:     Patient ID: Mary Mathis, female   DOB: 12-07-1975, 36 y.o.   MRN: [id number]  HPI  She is here for her first postoperative visit status post laparoscopic cholecystectomy. She is doing moderately well. She still has some food doesn't totally agree with her Review of Systems     Objective:   Physical Exam Her incisions are well healed. Her pathology showed chronic cholecystitis with gallstones    Assessment:     Patient stable status post laparoscopic cholecystectomy    Plan:     I will see her back as needed.

## 2012-01-11 ENCOUNTER — Emergency Department (HOSPITAL_COMMUNITY)
Admission: EM | Admit: 2012-01-11 | Discharge: 2012-01-11 | Disposition: A | Discharge status: Home / Self Care | Payer: No Typology Code available for payment source | Attending: Emergency Medicine | Admitting: Emergency Medicine

## 2012-01-11 ENCOUNTER — Encounter (HOSPITAL_COMMUNITY): Payer: Self-pay | Admitting: Family Medicine

## 2012-01-11 DIAGNOSIS — Z8719 Personal history of other diseases of the digestive system: Secondary | ICD-10-CM | POA: Insufficient documentation

## 2012-01-11 DIAGNOSIS — S43499A Other sprain of unspecified shoulder joint, initial encounter: Secondary | ICD-10-CM | POA: Insufficient documentation

## 2012-01-11 DIAGNOSIS — Y939 Activity, unspecified: Secondary | ICD-10-CM | POA: Insufficient documentation

## 2012-01-11 DIAGNOSIS — Y929 Unspecified place or not applicable: Secondary | ICD-10-CM | POA: Insufficient documentation

## 2012-01-11 DIAGNOSIS — S46819A Strain of other muscles, fascia and tendons at shoulder and upper arm level, unspecified arm, initial encounter: Secondary | ICD-10-CM | POA: Insufficient documentation

## 2012-01-11 DIAGNOSIS — S46319A Strain of muscle, fascia and tendon of triceps, unspecified arm, initial encounter: Secondary | ICD-10-CM

## 2012-01-11 DIAGNOSIS — X58XXXA Exposure to other specified factors, initial encounter: Secondary | ICD-10-CM | POA: Insufficient documentation

## 2012-01-11 MED ORDER — KETOROLAC TROMETHAMINE 60 MG/2ML IM SOLN
60.0000 mg | Freq: Once | INTRAMUSCULAR | Status: AC
  Administered 2012-01-11: 60 mg via INTRAMUSCULAR
  Filled 2012-01-11: qty 2

## 2012-01-11 MED ORDER — KETOROLAC TROMETHAMINE 60 MG/2ML IM SOLN: 60.0000 mg | Freq: Once | INTRAMUSCULAR | Status: DC

## 2012-01-11 MED ORDER — IBUPROFEN 600 MG PO TABS: 600.0000 mg | ORAL_TABLET | Freq: Three times a day (TID) | ORAL | Status: DC | PRN

## 2012-01-11 NOTE — ED Provider Notes (Signed)
History  This chart was scribed for Mary Racer, MD by Shari Heritage and Leone Payor, ER Scribes. The patient was seen in room TR06C/TR06C. Patient's care was started at 1405.     CSN: [id number]  Arrival date & time 01/11/12  1305   First MD Initiated Contact with Patient 01/11/12 1405      Chief Complaint  Patient presents with  . Arm Pain     Patient is a 36 y.o. female presenting with extremity pain. The history is provided by the patient. No language interpreter was used.  Extremity Pain This is a new problem. The current episode started more than 2 days ago. The problem occurs constantly. The problem has not changed since onset.Pertinent negatives include no shortness of breath. The symptoms are relieved by heat. She has tried a warm compress for the symptoms. The treatment provided mild relief.    Carynn D Fann is a 36 y.o. female who presents to the Emergency Department complaining of right arm pain with onset of 4 days ago. Pt says the pain is located on the upper arm with no radiation. She says pain is worse with extension and stretching. Pt denies any SOB, numbness, or any weakness. Pt reports taking Hydrocodone, Aleve, and ibuprofen all with no relief. Pt reports using a warm cloth to alleviate pain with temporary relief. Pt is a nonsmoker and not an alcohol user.    Past Medical History  Diagnosis Date  . Cholecystitis     Past Surgical History  Procedure Date  . Cholecystectomy 05/04/2011    Procedure: LAPAROSCOPIC CHOLECYSTECTOMY;  Surgeon: Shelly Rubenstein, MD;  Location: [facility];  Service: General;  Laterality: N/A;  . Intraoperative cholangiogram 05/04/2011    Procedure: INTRAOPERATIVE CHOLANGIOGRAM;  Surgeon: Shelly Rubenstein, MD;  Location: [facility];  Service: General;  Laterality: N/A;    History reviewed. No pertinent family history.  History  Substance Use Topics  . Smoking status: Never Smoker   . Smokeless tobacco: Never Used  . Alcohol Use: No     OB History    Grav Para Term Preterm Abortions TAB SAB Ect Mult Living                  Review of Systems  Respiratory: Negative for shortness of breath.   Musculoskeletal: Positive for myalgias.  Neurological: Negative for weakness and numbness.    Allergies  Review of patient's allergies indicates no known allergies.  Home Medications   Current Outpatient Rx  Name  Route  Sig  Dispense  Refill  . IBUPROFEN 600 MG PO TABS   Oral   Take 1 tablet (600 mg total) by mouth every 8 (eight) hours as needed for pain.   30 tablet   0     BP 167/107  Pulse 93  Temp 99.4 F (37.4 C)  Resp 18  SpO2 98%  LMP 01/07/2012  Physical Exam  Nursing note and vitals reviewed. Constitutional: She is oriented to person, place, and time. She appears well-developed and well-nourished.  HENT:  Head: Normocephalic and atraumatic.  Eyes: EOM are normal. Pupils are equal, round, and reactive to light.  Neck: Neck supple. No tracheal deviation present.  Cardiovascular: Normal rate.   Pulmonary/Chest: Effort normal. No respiratory distress.  Abdominal: Soft.  Musculoskeletal: Normal range of motion. She exhibits tenderness.       Tenderness over medial head of right tricep. Pain elicited with palpation over medial head of tricep and extension of the elbow .  No deformities. No trauma. Distal pulses intact, full ROM, and grip strength intact.   Neurological: She is alert and oriented to person, place, and time. No sensory deficit.  Skin: Skin is warm and dry.  Psychiatric: She has a normal mood and affect. Her behavior is normal.    ED Course  Procedures (including critical care time) DIAGNOSTIC STUDIES: Oxygen Saturation is 98% on room air, normal by my interpretation.    COORDINATION OF CARE:  2:34PM Patient informed of current plan for treatment and evaluation and agrees with plan at this time.     1. Strain of triceps muscle       MDM  I personally performed the  services described in this documentation, which was scribed in my presence. The recorded information has been reviewed and is accurate.    Mary Racer, MD 01/11/12 1600

## 2012-01-11 NOTE — ED Notes (Signed)
Pt works as Advertising copywriter. Started Thursday with right posterior shoulder pain, worse with movement. No known injury

## 2012-01-11 NOTE — ED Notes (Addendum)
Pt sts 3 days of right arm pain. Denies injury or heavy lifting. sts hurts in her shoulder when she moves her arm.

## 2013-01-01 ENCOUNTER — Emergency Department (HOSPITAL_COMMUNITY): Payer: No Typology Code available for payment source

## 2013-01-01 ENCOUNTER — Encounter (HOSPITAL_COMMUNITY): Payer: Self-pay | Admitting: Emergency Medicine

## 2013-01-01 ENCOUNTER — Emergency Department (HOSPITAL_COMMUNITY)
Admission: EM | Admit: 2013-01-01 | Discharge: 2013-01-01 | Disposition: A | Discharge status: Home / Self Care | Payer: No Typology Code available for payment source | Attending: Emergency Medicine | Admitting: Emergency Medicine

## 2013-01-01 DIAGNOSIS — Z8719 Personal history of other diseases of the digestive system: Secondary | ICD-10-CM | POA: Insufficient documentation

## 2013-01-01 DIAGNOSIS — E876 Hypokalemia: Secondary | ICD-10-CM | POA: Insufficient documentation

## 2013-01-01 DIAGNOSIS — R079 Chest pain, unspecified: Secondary | ICD-10-CM

## 2013-01-01 DIAGNOSIS — R0789 Other chest pain: Secondary | ICD-10-CM | POA: Insufficient documentation

## 2013-01-01 DIAGNOSIS — R51 Headache: Secondary | ICD-10-CM | POA: Insufficient documentation

## 2013-01-01 DIAGNOSIS — H53149 Visual discomfort, unspecified: Secondary | ICD-10-CM | POA: Insufficient documentation

## 2013-01-01 LAB — POCT I-STAT TROPONIN I: Troponin i, poc: 0 ng/mL (ref 0.00–0.08)

## 2013-01-01 LAB — CBC
MCV: 87.1 fL (ref 78.0–100.0)
Platelets: 256 10*3/uL (ref 150–400)
RBC: 4.43 MIL/uL (ref 3.87–5.11)
WBC: 6.8 10*3/uL (ref 4.0–10.5)

## 2013-01-01 LAB — BASIC METABOLIC PANEL
CO2: 25 mEq/L (ref 19–32)
Chloride: 103 mEq/L (ref 96–112)
Creatinine, Ser: 0.83 mg/dL (ref 0.50–1.10)

## 2013-01-01 MED ORDER — KETOROLAC TROMETHAMINE 30 MG/ML IJ SOLN
30.0000 mg | Freq: Once | INTRAMUSCULAR | Status: AC
  Administered 2013-01-01: 30 mg via INTRAMUSCULAR
  Filled 2013-01-01: qty 1

## 2013-01-01 MED ORDER — POTASSIUM CHLORIDE CRYS ER 20 MEQ PO TBCR
40.0000 meq | EXTENDED_RELEASE_TABLET | Freq: Once | ORAL | Status: AC
  Administered 2013-01-01: 40 meq via ORAL
  Filled 2013-01-01: qty 2

## 2013-01-01 NOTE — ED Notes (Addendum)
Pt reports onset today of sharp left side chest pains, pt feel like its back pain that radiates into her chest. Denies recent cough, sob or nausea but is having a headache and heavy menstrual cycle. ekg done at triage, no resp distress noted.

## 2013-01-01 NOTE — ED Provider Notes (Signed)
CSN: [id number]     Arrival date & time 01/01/13  1441 History   First MD Initiated Contact with Patient 01/01/13 1654     Chief Complaint  Patient presents with  . Chest Pain  . Headache   (Consider location/radiation/quality/duration/timing/severity/associated sxs/prior Treatment) HPI Comments: Patient presents today with a chief complaint of chest pain and headache.  She reports that she had an episode of chest pain earlier today.  She described the pain as a "pinching" sensation.  She reports that the pain radiated to her back.  She states that the pain lasted approximately ten seconds and then resolved completely. She reports that the pain was brought on by moving a certain position.  No association with eating or exertion.  She is currently not having any chest pain at this time.  She denies any associated SOB, lower extremity edema, nausea, vomiting, numbness, tingling, dizziness, or lightheadedness.  No prior history of Cardiac Disease.  No history of PE or DVT.  No use of estrogen containing medications.  No surgery or prolonged travel in the past 4 weeks.  Patient is also complaining of a frontal headache.  She reports that the pain came on gradually this morning and has been constant since that time.  She denies any head injury or trauma.  She reports she does have some associated photophobia.  Denies nausea or vomiting.  Denies changes in vision.  Denies fever, chills, neck pain, or neck stiffness.  She has not taken anything for pain prior to arrival.  The history is provided by the patient.    Past Medical History  Diagnosis Date  . Cholecystitis    Past Surgical History  Procedure Laterality Date  . Cholecystectomy  05/04/2011    Procedure: LAPAROSCOPIC CHOLECYSTECTOMY;  Surgeon: Shelly Rubenstein, MD;  Location: [facility];  Service: General;  Laterality: N/A;  . Intraoperative cholangiogram  05/04/2011    Procedure: INTRAOPERATIVE CHOLANGIOGRAM;  Surgeon: Shelly Rubenstein, MD;   Location: [facility];  Service: General;  Laterality: N/A;   History reviewed. No pertinent family history. History  Substance Use Topics  . Smoking status: Never Smoker   . Smokeless tobacco: Never Used  . Alcohol Use: No   OB History   Grav Para Term Preterm Abortions TAB SAB Ect Mult Living                 Review of Systems  Cardiovascular: Positive for chest pain.  Neurological: Positive for headaches.    Allergies  Review of patient's allergies indicates no known allergies.  Home Medications   Current Outpatient Rx  Name  Route  Sig  Dispense  Refill  . ibuprofen (ADVIL,MOTRIN) 600 MG tablet   Oral   Take 1 tablet (600 mg total) by mouth every 8 (eight) hours as needed for pain.   30 tablet   0    BP 154/93  Pulse 93  Temp(Src) 98.6 F (37 C) (Oral)  Resp 18  Wt 322 lb 1.6 oz (146.104 kg)  SpO2 100%  LMP 01/01/2013 Physical Exam  Nursing note and vitals reviewed. Constitutional: She appears well-developed and well-nourished.  HENT:  Head: Normocephalic and atraumatic.  Eyes: EOM are normal. Pupils are equal, round, and reactive to light.  Neck: Normal range of motion. Neck supple.  Cardiovascular: Normal rate, regular rhythm and normal heart sounds.   Pulmonary/Chest: Effort normal and breath sounds normal.  Abdominal: Soft. There is no tenderness.  Musculoskeletal: Normal range of motion.  No LE edema  Skin: Skin is warm and dry.  Psychiatric: She has a normal mood and affect.    ED Course  Procedures (including critical care time) Labs Review Labs Reviewed  BASIC METABOLIC PANEL - Abnormal; Notable for the following:    Potassium 3.1 (*)    Glucose, Bld 146 (*)    GFR calc non Af Amer 90 (*)    All other components within normal limits  CBC  POCT I-STAT TROPONIN I   Imaging Review Dg Chest 2 View  01/01/2013   CLINICAL DATA:  Chest pain and headache for 1 day.  EXAM: CHEST  2 VIEW  COMPARISON:  None  FINDINGS: The heart size and mediastinal  contours are within normal limits. Both lungs are clear. The visualized skeletal structures are unremarkable.  IMPRESSION: No active cardiopulmonary disease.   Electronically Signed   By: Rosalie Gums M.D.   On: 01/01/2013 15:46    EKG Interpretation   None      7:55 PM Reassessed patient.  She reports that her headache has completely resolved. MDM  No diagnosis found. Patient complaining of two separate complaints.  She reports that she had an episode of chest pain that lasted approximately ten seconds earlier today.  No chest pain during ED course.  No ischemic changes on EKG.  CXR negative.  VSS.  PERC negative.  Patient also complaining of headache.  Pt HA treated and improved while in ED.  Presentation is like pts typical HA and non concerning for [facility], ICH, Meningitis, or temporal arteritis. Pt is afebrile with no focal neuro deficits, nuchal rigidity, or change in vision. Pt is to follow up with PCP. Pt verbalizes understanding and is agreeable with plan to dc.      Santiago Glad, PA-C 01/02/13 1418

## 2013-01-02 NOTE — ED Provider Notes (Signed)
Medical screening examination/treatment/procedure(s) were performed by non-physician practitioner and as supervising physician I was immediately available for consultation/collaboration.  EKG Interpretation   None         Shanna Cisco, MD 01/02/13 [phone]

## 2014-06-06 ENCOUNTER — Emergency Department (HOSPITAL_COMMUNITY)
Admission: EM | Admit: 2014-06-06 | Discharge: 2014-06-06 | Disposition: A | Discharge status: Home / Self Care | Payer: No Typology Code available for payment source | Attending: Emergency Medicine | Admitting: Emergency Medicine

## 2014-06-06 ENCOUNTER — Encounter (HOSPITAL_COMMUNITY): Payer: Self-pay | Admitting: *Deleted

## 2014-06-06 DIAGNOSIS — Z8719 Personal history of other diseases of the digestive system: Secondary | ICD-10-CM | POA: Insufficient documentation

## 2014-06-06 DIAGNOSIS — I10 Essential (primary) hypertension: Secondary | ICD-10-CM | POA: Insufficient documentation

## 2014-06-06 DIAGNOSIS — R42 Dizziness and giddiness: Secondary | ICD-10-CM | POA: Insufficient documentation

## 2014-06-06 LAB — BASIC METABOLIC PANEL
ANION GAP: 7 (ref 5–15)
BUN: 5 mg/dL — ABNORMAL LOW (ref 6–23)
CHLORIDE: 106 mmol/L (ref 96–112)
CO2: 26 mmol/L (ref 19–32)
Calcium: 8.4 mg/dL (ref 8.4–10.5)
Creatinine, Ser: 0.9 mg/dL (ref 0.50–1.10)
GFR calc non Af Amer: 81 mL/min — ABNORMAL LOW (ref >90)
Glucose, Bld: 126 mg/dL — ABNORMAL HIGH (ref 70–99)
Potassium: 3.3 mmol/L — ABNORMAL LOW (ref 3.5–5.1)
SODIUM: 139 mmol/L (ref 135–145)

## 2014-06-06 LAB — CBC WITH DIFFERENTIAL/PLATELET
BASOS ABS: 0 10*3/uL (ref 0.0–0.1)
Basophils Relative: 0 % (ref 0–1)
Eosinophils Absolute: 0.2 10*3/uL (ref 0.0–0.7)
Eosinophils Relative: 3 % (ref 0–5)
HEMATOCRIT: 36.6 % (ref 36.0–46.0)
Hemoglobin: 12 g/dL (ref 12.0–15.0)
LYMPHS PCT: 23 % (ref 12–46)
Lymphs Abs: 1.5 10*3/uL (ref 0.7–4.0)
MCH: 28.7 pg (ref 26.0–34.0)
MCHC: 32.8 g/dL (ref 30.0–36.0)
MCV: 87.6 fL (ref 78.0–100.0)
MONO ABS: 0.6 10*3/uL (ref 0.1–1.0)
Monocytes Relative: 9 % (ref 3–12)
Neutro Abs: 4.4 10*3/uL (ref 1.7–7.7)
Neutrophils Relative %: 65 % (ref 43–77)
PLATELETS: 231 10*3/uL (ref 150–400)
RBC: 4.18 MIL/uL (ref 3.87–5.11)
RDW: 12.8 % (ref 11.5–15.5)
WBC: 6.7 10*3/uL (ref 4.0–10.5)

## 2014-06-06 NOTE — ED Provider Notes (Signed)
CSN: [id number]     Arrival date & time 06/06/14  1213 History   First MD Initiated Contact with Patient 06/06/14 1249     Chief Complaint  Patient presents with  . Hypertension     (Consider location/radiation/quality/duration/timing/severity/associated sxs/prior Treatment) HPI Comments: Patient reports eating salty food today and then feeling lightheaded. Patient is concerned these symptoms may be attributed to high blood pressure or dehydration. Patient does not take BP medication.   Patient is a 39 y.o. female presenting with hypertension. The history is provided by the patient. No language interpreter was used.  Hypertension This is a recurrent problem. The current episode started today. The problem occurs constantly. The problem has been unchanged. Pertinent negatives include no abdominal pain, anorexia, arthralgias, chest pain, chills, coughing, fatigue, fever, headaches, joint swelling, nausea, neck pain, numbness, sore throat, visual change, vomiting or weakness. Associated symptoms comments: lightheadedness. Nothing aggravates the symptoms. She has tried nothing for the symptoms. The treatment provided no relief.    Past Medical History  Diagnosis Date  . Cholecystitis    Past Surgical History  Procedure Laterality Date  . Cholecystectomy  05/04/2011    Procedure: LAPAROSCOPIC CHOLECYSTECTOMY;  Surgeon: Harl Bowie, MD;  Location: [facility];  Service: General;  Laterality: N/A;  . Intraoperative cholangiogram  05/04/2011    Procedure: INTRAOPERATIVE CHOLANGIOGRAM;  Surgeon: Harl Bowie, MD;  Location: [facility];  Service: General;  Laterality: N/A;   History reviewed. No pertinent family history. History  Substance Use Topics  . Smoking status: Never Smoker   . Smokeless tobacco: Never Used  . Alcohol Use: No   OB History    No data available     Review of Systems  Constitutional: Negative for fever, chills and fatigue.       Hypertension  HENT: Negative for  sore throat and trouble swallowing.   Eyes: Negative for visual disturbance.  Respiratory: Negative for cough and shortness of breath.   Cardiovascular: Negative for chest pain and palpitations.  Gastrointestinal: Negative for nausea, vomiting, abdominal pain, diarrhea and anorexia.  Genitourinary: Negative for dysuria and difficulty urinating.  Musculoskeletal: Negative for joint swelling, arthralgias and neck pain.  Skin: Negative for color change.  Neurological: Negative for dizziness, weakness, numbness and headaches.  Psychiatric/Behavioral: Negative for dysphoric mood.      Allergies  Review of patient's allergies indicates no known allergies.  Home Medications   Prior to Admission medications   Not on File   BP 157/86 mmHg  Pulse 97  Temp(Src) 97.9 F (36.6 C) (Oral)  Resp 20  SpO2 98%  LMP 06/04/2014 Physical Exam  Constitutional: She is oriented to person, place, and time. She appears well-developed and well-nourished. No distress.  HENT:  Head: Normocephalic and atraumatic.  Eyes: Conjunctivae and EOM are normal.  Neck: Normal range of motion.  Cardiovascular: Normal rate and regular rhythm.  Exam reveals no gallop and no friction rub.   No murmur heard. Pulmonary/Chest: Effort normal and breath sounds normal. She has no wheezes. She has no rales. She exhibits no tenderness.  Abdominal: Soft. She exhibits no distension. There is no tenderness. There is no rebound and no guarding.  Musculoskeletal: Normal range of motion.  Neurological: She is alert and oriented to person, place, and time. Coordination normal.  Speech is goal-oriented. Moves limbs without ataxia.   Skin: Skin is warm and dry.  Psychiatric: She has a normal mood and affect. Her behavior is normal.  Nursing note and vitals reviewed.  ED Course  Procedures (including critical care time) Labs Review Labs Reviewed  BASIC METABOLIC PANEL - Abnormal; Notable for the following:    Potassium 3.3  (*)    Glucose, Bld 126 (*)    BUN 5 (*)    GFR calc non Af Amer 81 (*)    All other components within normal limits  CBC WITH DIFFERENTIAL/PLATELET    Imaging Review No results found.   EKG Interpretation   Date/Time:  Wednesday June 06 2014 13:35:10 EDT Ventricular Rate:  99 PR Interval:  184 QRS Duration: 87 QT Interval:  375 QTC Calculation: 481 R Axis:   58 Text Interpretation:  Sinus rhythm Probable left atrial enlargement  Baseline wander in lead(s) II III aVF V2 Confirmed by COOK  MD, BRIAN  [phone]) on 06/06/2014 1:49:05 PM      MDM   Final diagnoses:  Lightheaded  Essential hypertension   2:45 PM Patient's labs and EKG unremarkable for acute changes. Vitals stable and patient afebrile. Patient will speak with social work about PCP follow up. Patient will have information about hypertension and lifestyle changes. Patient instructed to return with worsening or concerning symptoms.     Alvina Chou, PA-C 06/06/14 [place], MD 06/06/14 1524

## 2014-06-06 NOTE — Progress Notes (Signed)
LCSW reviewed patient chart and consulted by PA regarding patient needing access to care. Patient recently relocated to [facility] and does not have a PCP or insurance. Patient had insurance with previous job, but with new job it is not an option.  LCSW will have [facility] worker review patient information regarding additional help and any clinics patient can attend. Also gave information for community health and wellness.  Will follow up with [facility] worker. Patient aware.  Lane Hacker, MSW Clinical Social Work: Emergency Room [phone]

## 2014-06-06 NOTE — Discharge Instructions (Signed)
Follow up with a recommended primary care provider. Refer to attached documents for more information regarding hypertension. Return to the ED with worsening or concerning symptoms.

## 2014-06-06 NOTE — ED Notes (Signed)
Reports eating salty food today, was having twitching sensation to her upper lip and feeling dehydrated. bp 182/102 at triage.

## 2014-07-06 ENCOUNTER — Ambulatory Visit: Payer: No Typology Code available for payment source | Attending: Family Medicine | Admitting: Family Medicine

## 2014-07-06 ENCOUNTER — Encounter: Payer: Self-pay | Admitting: Family Medicine

## 2014-07-06 VITALS — BP 150/88 | HR 99 | Temp 98.7°F | Resp 18 | Ht 67.0 in | Wt 321.0 lb

## 2014-07-06 DIAGNOSIS — R600 Localized edema: Secondary | ICD-10-CM | POA: Insufficient documentation

## 2014-07-06 DIAGNOSIS — I10 Essential (primary) hypertension: Secondary | ICD-10-CM | POA: Diagnosis not present

## 2014-07-06 DIAGNOSIS — Z9049 Acquired absence of other specified parts of digestive tract: Secondary | ICD-10-CM | POA: Insufficient documentation

## 2014-07-06 DIAGNOSIS — E669 Obesity, unspecified: Secondary | ICD-10-CM | POA: Diagnosis not present

## 2014-07-06 DIAGNOSIS — Z6841 Body Mass Index (BMI) 40.0 and over, adult: Secondary | ICD-10-CM | POA: Insufficient documentation

## 2014-07-06 MED ORDER — LISINOPRIL-HYDROCHLOROTHIAZIDE 10-12.5 MG PO TABS
1.0000 | ORAL_TABLET | Freq: Every day | ORAL | Status: DC
Start: 2014-07-06 — End: 2015-11-23

## 2014-07-06 NOTE — Progress Notes (Signed)
Patient went to ED 06/06/14 for being lightheaded, dehydrated, both ankles swollen. Patient reports feeling better but can tell sometimes when ankles start swelling.

## 2014-07-06 NOTE — Patient Instructions (Signed)

## 2014-07-06 NOTE — Progress Notes (Signed)
Subjective:    Patient ID: Mary Mathis, female    DOB: Jun 30, 1975, 39 y.o.   MRN: [id number]  HPI  Mary Mathis is a  39 year old female with no previous past medical history who had presented to the ED with symptoms of lightheadedness and was found to have an elevated blood pressure.   She had troponins drawn which came back negative, she had a hypokalemia of 3.3 and EKG was negative for acute changes ; she was discharged with instructions to follow-up with a  primary care physician for her hypertension.   Interval history:   Her blood pressure is elevated the clinic today and she is currently not on any antihypertensives. 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 Denies shortness of breath, chest pains , orthopnea.  Past Medical History  Diagnosis Date  . Cholecystitis   . Hypertension     Past Surgical History  Procedure Laterality Date  . Cholecystectomy  05/04/2011    Procedure: LAPAROSCOPIC CHOLECYSTECTOMY;  Surgeon: Harl Bowie, MD;  Location: [facility];  Service: General;  Laterality: N/A;  . Intraoperative cholangiogram  05/04/2011    Procedure: INTRAOPERATIVE CHOLANGIOGRAM;  Surgeon: Harl Bowie, MD;  Location: [facility];  Service: General;  Laterality: N/A;    Family History  Problem Relation Age of Onset  . Hypertension Mother   . Diabetes Father   . Hypertension Father     History   Social History  . Marital Status: Single    Spouse Name: N/A  . Number of Children: N/A  . Years of Education: N/A   Occupational History  . Not on file.   Social History Main Topics  . Smoking status: Never Smoker   . Smokeless tobacco: Never Used  . Alcohol Use: No  . Drug Use: No  . Sexual Activity: Yes    Birth Control/ Protection: None   Other Topics Concern  . Not on file   Social History Narrative    No Known Allergies  No current outpatient prescriptions on file  prior to visit.   No current facility-administered medications on file prior to visit.     Review of Systems  Constitutional: Negative for activity change, appetite change and fatigue.  HENT: Negative for congestion, sinus pressure and sore throat.   Eyes: Negative for visual disturbance.  Respiratory: Negative for cough, chest tightness, shortness of breath and wheezing.   Cardiovascular: Positive for leg swelling. Negative for chest pain and palpitations.  Gastrointestinal: Negative for abdominal pain, constipation and abdominal distention.  Endocrine: Negative for polydipsia.  Genitourinary: Negative for dysuria and frequency.  Musculoskeletal: Negative for back pain and arthralgias.  Skin: Negative for rash.  Neurological: Negative for tremors, light-headedness and numbness.  Hematological: Does not bruise/bleed easily.  Psychiatric/Behavioral: Negative for behavioral problems and agitation.         Objective: Filed Vitals:   07/06/14 1441  BP: 150/88  Pulse:   Temp:   Resp:       Physical Exam  Constitutional: She is oriented to person, place, and time. She appears well-developed and well-nourished. No distress.  Obese  HENT:  Head: Normocephalic.  Right Ear: External ear normal.  Left Ear: External ear normal.  Nose: Nose normal.  Mouth/Throat: Oropharynx is clear and moist.  Eyes: Conjunctivae and EOM are normal. Pupils are equal, round, and reactive to light.  Neck: Normal range of motion. No  JVD present.  Cardiovascular: Normal rate, regular rhythm, normal heart sounds and intact distal pulses.  Exam reveals no gallop.   No murmur heard. Pulmonary/Chest: Effort normal and breath sounds normal. No respiratory distress. She has no wheezes. She has no rales. She exhibits no tenderness.  Abdominal: Soft. Bowel sounds are normal. She exhibits no distension and no mass. There is no tenderness.  Musculoskeletal: Normal range of motion. She exhibits no edema or  tenderness.  Neurological: She is alert and oriented to person, place, and time. She has normal reflexes.  Skin: Skin is warm and dry. She is not diaphoretic.  Psychiatric: She has a normal mood and affect.            Assessment & Plan:   39 year old female patient with newly diagnosed hypertension complaining of pedal edema which is likely dependent given her job as a Animal nutritionist  Who is always on her feet.     Essential hypertension:  Commenced on lisinopril/HCTZ and side effects discussed unclothing teratogenicity.  We will repeat a comprehensive metabolic panel to evaluate for hypokalemia which she previously had.   Lifestyle modifications, low sodium DASH diet advised.   Pedal edema:   Likely dependent.   Advised to use compression stockings , elevate feet , low-sodium diet.  Hopefully the diuretic effect of his HCTZ  will  alleviate symptoms.   Obesity :   Advised to increase physical activity and reduce portion sizes.

## 2014-07-20 ENCOUNTER — Ambulatory Visit: Payer: No Typology Code available for payment source | Attending: Family Medicine | Admitting: Family Medicine

## 2014-07-20 ENCOUNTER — Encounter: Payer: Self-pay | Admitting: Family Medicine

## 2014-07-20 VITALS — BP 122/81 | HR 90 | Temp 98.2°F | Resp 18 | Ht 67.0 in | Wt 322.0 lb

## 2014-07-20 DIAGNOSIS — E876 Hypokalemia: Secondary | ICD-10-CM | POA: Diagnosis not present

## 2014-07-20 DIAGNOSIS — I10 Essential (primary) hypertension: Secondary | ICD-10-CM | POA: Diagnosis not present

## 2014-07-20 LAB — BASIC METABOLIC PANEL
BUN: 10 mg/dL (ref 6–23)
CHLORIDE: 103 meq/L (ref 96–112)
CO2: 27 meq/L (ref 19–32)
Calcium: 8.9 mg/dL (ref 8.4–10.5)
Creat: 0.94 mg/dL (ref 0.50–1.10)
Glucose, Bld: 91 mg/dL (ref 70–99)
POTASSIUM: 4.1 meq/L (ref 3.5–5.3)
Sodium: 139 mEq/L (ref 135–145)

## 2014-07-20 MED ORDER — NAPROXEN 500 MG PO TABS: 500.0000 mg | ORAL_TABLET | Freq: Two times a day (BID) | ORAL | Status: DC

## 2014-07-20 NOTE — Patient Instructions (Signed)

## 2014-07-20 NOTE — Progress Notes (Signed)
Subjective:    Patient ID: Mary Mathis, female    DOB: 02-Sep-1975, 39 y.o.   MRN: [id number]  HPI Mary Mathis was seen for elevated blood pressure on pedal edema at her last office visit and had not been on any antihypertensives.  She was commenced on lisinopril/HCTZ and reports doing well on the medication with resolution of pedal edema. She has also been walking for exercise and notes that after walking yesterday she had right anterior shoulder pain which is 5/10 at rest and 10/10 when she rotates her shoulder. She denies any heavy lifting and has no prior history of trauma.  Past Medical History  Diagnosis Date  . Cholecystitis   . Hypertension     Past Surgical History  Procedure Laterality Date  . Cholecystectomy  05/04/2011    Procedure: LAPAROSCOPIC CHOLECYSTECTOMY;  Surgeon: Harl Bowie, MD;  Location: [facility];  Service: General;  Laterality: N/A;  . Intraoperative cholangiogram  05/04/2011    Procedure: INTRAOPERATIVE CHOLANGIOGRAM;  Surgeon: Harl Bowie, MD;  Location: [facility];  Service: General;  Laterality: N/A;    History   Social History  . Marital Status: Single    Spouse Name: N/A  . Number of Children: N/A  . Years of Education: N/A   Occupational History  . Not on file.   Social History Main Topics  . Smoking status: Never Smoker   . Smokeless tobacco: Never Used  . Alcohol Use: No  . Drug Use: No  . Sexual Activity: Yes    Birth Control/ Protection: None   Other Topics Concern  . Not on file   Social History Narrative    No Known Allergies  Current Outpatient Prescriptions on File Prior to Visit  Medication Sig Dispense Refill  . lisinopril-hydrochlorothiazide (PRINZIDE,ZESTORETIC) 10-12.5 MG per tablet Take 1 tablet by mouth daily. 30 tablet 3   No current facility-administered medications on file prior to visit.     Review of Systems  General: negative for fever, weight loss, appetite change Eyes: no visual  symptoms. ENT: no ear symptoms, no sinus tenderness, no nasal congestion or sore throat. Neck: no pain  Respiratory: no wheezing, shortness of breath, cough Cardiovascular: no chest pain, no dyspnea on exertion, no pedal edema, no orthopnea. Gastrointestinal: no abdominal pain, no diarrhea, no constipation Genito-Urinary: no urinary frequency, no dysuria, no polyuria. Hematologic: no bruising Endocrine: no cold or heat intolerance Neurological: no headaches, no seizures, no tremors Musculoskeletal: R anterior shoulder pain on internal rotation of shoulder, no edema.         Objective: Filed Vitals:   07/20/14 1438  BP: 122/81  Pulse: 90  Temp: 98.2 F (36.8 C)  TempSrc: Oral  Resp: 18  Height: 5\' 7"  (1.702 m)  Weight: 322 lb (146.058 kg)  SpO2: 99%      Physical Exam Constitutional: normal appearing,  Eyes: PERRLA HEENT: Head is atraumatic, normal sinuses, normal oropharynx, normal appearing tonsils and palate, tympanic membrane is normal bilaterally. Neck: normal range of motion, no thyromegaly, no JVD Cardiovascular: normal rate and rhythm, normal heart sounds, no murmurs, rub or gallop, no pedal edema Respiratory: clear to auscultation bilaterally, no wheezes, no rales, no rhonchi Abdomen: soft, not tender to palpation, normal bowel sounds, no enlarged organs Extremities: Full ROM, tenderness in anterior aspect of R shoulder joint on internal rotation and palpation. Skin: warm and dry, no lesions.   CMP Latest Ref Rng 06/06/2014 01/01/2013 04/29/2011  Glucose 70 - 99 mg/dL 126(H) 146(H)  94  BUN 6 - 23 mg/dL 5(L) 9 10  Creatinine 0.50 - 1.10 mg/dL 0.90 0.83 0.82  Sodium 135 - 145 mmol/L 139 137 139  Potassium 3.5 - 5.1 mmol/L 3.3(L) 3.1(L) 3.8  Chloride 96 - 112 mmol/L 106 103 105  CO2 19 - 32 mmol/L 26 25 28   Calcium 8.4 - 10.5 mg/dL 8.4 8.7 8.7  Total Protein 6.0 - 8.3 g/dL - - -  Total Bilirubin 0.3 - 1.2 mg/dL - - -  Alkaline Phos 39 - 117 U/L - - -  AST 0 - 37  U/L - - -  ALT 0 - 35 U/L - - -        Assessment & Plan:  39 year old female with a history of hypertension recently commenced on antihypertensives and labs positive for hypokalemia; she now complains of right shoulder pain.  Hypertension: Controlled. I will send off a basic metabolic panel today given she is on lisinopril/HCTZ.  Hypokalemia: Potassium level was 3.3. Repeat potassium level today.  Shoulder pain: Advised to apply heat. I will place her on NSAID and reassess at the next office visit.

## 2014-07-20 NOTE — Progress Notes (Signed)
Patient feels better, thinks the medication is working. Patient reports pain in right arm/shoulder, at level 10, started yesterday after walking.

## 2014-07-23 ENCOUNTER — Telehealth: Payer: Self-pay

## 2014-07-23 NOTE — Telephone Encounter (Signed)
-----   Message from Arnoldo Morale, MD sent at 07/21/2014  9:38 PM EDT ----- Please inform the patient that labs are normal. Thank you.

## 2014-07-23 NOTE — Telephone Encounter (Signed)
Patient is returning phone call from nurse in regards to results, please f/u with pt.

## 2014-07-23 NOTE — Telephone Encounter (Signed)
Nurse called patient, reached voicemail. Left message for patient to call [place] at [phone]. Nurse was calling to inform patient of normal labs.

## 2014-07-23 NOTE — Telephone Encounter (Signed)
Patient called returning nurse's phone call. Please f/u with patient

## 2014-07-24 ENCOUNTER — Telehealth: Payer: Self-pay

## 2014-07-24 NOTE — Telephone Encounter (Signed)
Nurse called patient, patient verified date of birth. Patient aware of normal labs. Patient reports having low potassium prior to seeing Dr. Jarold Song. Patient aware of normal potassium at this time. Patient verbalizes understanding.

## 2014-07-24 NOTE — Telephone Encounter (Signed)
-----   Message from Arnoldo Morale, MD sent at 07/21/2014  9:38 PM EDT ----- Please inform the patient that labs are normal. Thank you.

## 2014-07-24 NOTE — Telephone Encounter (Signed)
Patient called in and was given test results of normal labs.

## 2015-04-07 ENCOUNTER — Encounter (HOSPITAL_COMMUNITY): Payer: Self-pay | Admitting: Emergency Medicine

## 2015-04-07 ENCOUNTER — Emergency Department (HOSPITAL_COMMUNITY)
Admission: EM | Admit: 2015-04-07 | Discharge: 2015-04-07 | Disposition: A | Discharge status: Home / Self Care | Payer: Self-pay | Attending: Emergency Medicine | Admitting: Emergency Medicine

## 2015-04-07 DIAGNOSIS — I10 Essential (primary) hypertension: Secondary | ICD-10-CM | POA: Insufficient documentation

## 2015-04-07 DIAGNOSIS — R110 Nausea: Secondary | ICD-10-CM | POA: Insufficient documentation

## 2015-04-07 DIAGNOSIS — R42 Dizziness and giddiness: Secondary | ICD-10-CM | POA: Insufficient documentation

## 2015-04-07 LAB — COMPREHENSIVE METABOLIC PANEL
ALT: 27 U/L (ref 14–54)
ANION GAP: 9 (ref 5–15)
AST: 39 U/L (ref 15–41)
Albumin: 4.1 g/dL (ref 3.5–5.0)
Alkaline Phosphatase: 90 U/L (ref 38–126)
BUN: 8 mg/dL (ref 6–20)
CHLORIDE: 105 mmol/L (ref 101–111)
CO2: 28 mmol/L (ref 22–32)
Calcium: 9 mg/dL (ref 8.9–10.3)
Creatinine, Ser: 0.85 mg/dL (ref 0.44–1.00)
Glucose, Bld: 110 mg/dL — ABNORMAL HIGH (ref 65–99)
Potassium: 3.1 mmol/L — ABNORMAL LOW (ref 3.5–5.1)
SODIUM: 142 mmol/L (ref 135–145)
Total Bilirubin: 0.8 mg/dL (ref 0.3–1.2)
Total Protein: 7.9 g/dL (ref 6.5–8.1)

## 2015-04-07 LAB — I-STAT BETA HCG BLOOD, ED (MC, WL, AP ONLY)

## 2015-04-07 LAB — CBC
HCT: 37.5 % (ref 36.0–46.0)
HEMOGLOBIN: 11.8 g/dL — AB (ref 12.0–15.0)
MCH: 27.5 pg (ref 26.0–34.0)
MCHC: 31.5 g/dL (ref 30.0–36.0)
MCV: 87.4 fL (ref 78.0–100.0)
Platelets: 295 10*3/uL (ref 150–400)
RBC: 4.29 MIL/uL (ref 3.87–5.11)
RDW: 13.9 % (ref 11.5–15.5)
WBC: 6.4 10*3/uL (ref 4.0–10.5)

## 2015-04-07 LAB — LIPASE, BLOOD: Lipase: 40 U/L (ref 11–51)

## 2015-04-07 NOTE — ED Notes (Signed)
Pt states she has been exerting herself all weekend and has not been hydrating properly. Today she feels dizzy and nauseated.

## 2015-04-07 NOTE — ED Provider Notes (Signed)
Patient called for room assignment without answer.  Patient LWBS after triage.  I did not see or evaluate patient.  Larene Pickett, PA-C 04/07/15 [place], MD 04/07/15 [phone]

## 2015-04-25 ENCOUNTER — Encounter (HOSPITAL_COMMUNITY): Payer: Self-pay | Admitting: Emergency Medicine

## 2015-04-25 ENCOUNTER — Emergency Department (HOSPITAL_COMMUNITY)
Admission: EM | Admit: 2015-04-25 | Discharge: 2015-04-26 | Disposition: A | Discharge status: Home / Self Care | Payer: Self-pay | Attending: Emergency Medicine | Admitting: Emergency Medicine

## 2015-04-25 DIAGNOSIS — G44209 Tension-type headache, unspecified, not intractable: Secondary | ICD-10-CM | POA: Insufficient documentation

## 2015-04-25 DIAGNOSIS — I10 Essential (primary) hypertension: Secondary | ICD-10-CM | POA: Insufficient documentation

## 2015-04-25 DIAGNOSIS — N939 Abnormal uterine and vaginal bleeding, unspecified: Secondary | ICD-10-CM | POA: Insufficient documentation

## 2015-04-25 DIAGNOSIS — Z79899 Other long term (current) drug therapy: Secondary | ICD-10-CM | POA: Insufficient documentation

## 2015-04-25 DIAGNOSIS — R1012 Left upper quadrant pain: Secondary | ICD-10-CM | POA: Insufficient documentation

## 2015-04-25 DIAGNOSIS — Z3202 Encounter for pregnancy test, result negative: Secondary | ICD-10-CM | POA: Insufficient documentation

## 2015-04-25 DIAGNOSIS — Z8719 Personal history of other diseases of the digestive system: Secondary | ICD-10-CM | POA: Insufficient documentation

## 2015-04-25 DIAGNOSIS — M549 Dorsalgia, unspecified: Secondary | ICD-10-CM | POA: Insufficient documentation

## 2015-04-25 LAB — URINALYSIS, ROUTINE W REFLEX MICROSCOPIC
Bilirubin Urine: NEGATIVE
GLUCOSE, UA: NEGATIVE mg/dL
KETONES UR: NEGATIVE mg/dL
Nitrite: NEGATIVE
PROTEIN: NEGATIVE mg/dL
Specific Gravity, Urine: 1.018 (ref 1.005–1.030)
pH: 5.5 (ref 5.0–8.0)

## 2015-04-25 LAB — URINE MICROSCOPIC-ADD ON

## 2015-04-25 NOTE — ED Notes (Signed)
Patient here with complaint of left lower back pain and right sided headache. Has been using OTC medications with some relief but the pain returns. Onset 2 days ago. Denies urinary symptoms. Denies fever. BP elevated in triage, patient states history to HTN but hasn't been on meds lately.

## 2015-04-26 LAB — CBC WITH DIFFERENTIAL/PLATELET
Basophils Absolute: 0 10*3/uL (ref 0.0–0.1)
Basophils Relative: 0 %
EOS ABS: 0.2 10*3/uL (ref 0.0–0.7)
Eosinophils Relative: 3 %
HCT: 36.8 % (ref 36.0–46.0)
Hemoglobin: 11.8 g/dL — ABNORMAL LOW (ref 12.0–15.0)
Lymphocytes Relative: 29 %
Lymphs Abs: 2.7 10*3/uL (ref 0.7–4.0)
MCH: 27.5 pg (ref 26.0–34.0)
MCHC: 32.1 g/dL (ref 30.0–36.0)
MCV: 85.8 fL (ref 78.0–100.0)
MONO ABS: 0.9 10*3/uL (ref 0.1–1.0)
Monocytes Relative: 9 %
NEUTROS PCT: 59 %
Neutro Abs: 5.5 10*3/uL (ref 1.7–7.7)
Platelets: 245 10*3/uL (ref 150–400)
RBC: 4.29 MIL/uL (ref 3.87–5.11)
RDW: 13.9 % (ref 11.5–15.5)
WBC: 9.4 10*3/uL (ref 4.0–10.5)

## 2015-04-26 LAB — I-STAT BETA HCG BLOOD, ED (MC, WL, AP ONLY)

## 2015-04-26 LAB — COMPREHENSIVE METABOLIC PANEL
ALT: 29 U/L (ref 14–54)
AST: 30 U/L (ref 15–41)
Albumin: 3.5 g/dL (ref 3.5–5.0)
Alkaline Phosphatase: 84 U/L (ref 38–126)
Anion gap: 10 (ref 5–15)
BUN: 9 mg/dL (ref 6–20)
CHLORIDE: 105 mmol/L (ref 101–111)
CO2: 22 mmol/L (ref 22–32)
Calcium: 8.9 mg/dL (ref 8.9–10.3)
Creatinine, Ser: 0.84 mg/dL (ref 0.44–1.00)
Glucose, Bld: 82 mg/dL (ref 65–99)
Potassium: 3.4 mmol/L — ABNORMAL LOW (ref 3.5–5.1)
SODIUM: 137 mmol/L (ref 135–145)
Total Bilirubin: 0.2 mg/dL — ABNORMAL LOW (ref 0.3–1.2)
Total Protein: 7.3 g/dL (ref 6.5–8.1)

## 2015-04-26 LAB — LIPASE, BLOOD: LIPASE: 51 U/L (ref 11–51)

## 2015-04-26 MED ORDER — ACETAMINOPHEN 325 MG PO TABS: 650.0000 mg | ORAL_TABLET | ORAL | Status: DC | PRN

## 2015-04-26 MED ORDER — KETOROLAC TROMETHAMINE 30 MG/ML IJ SOLN
30.0000 mg | Freq: Once | INTRAMUSCULAR | Status: AC
  Administered 2015-04-26: 30 mg via INTRAVENOUS
  Filled 2015-04-26: qty 1

## 2015-04-26 MED ORDER — IBUPROFEN 600 MG PO TABS: 600.0000 mg | ORAL_TABLET | Freq: Four times a day (QID) | ORAL | Status: DC | PRN

## 2015-04-26 MED ORDER — SODIUM CHLORIDE 0.9 % IV BOLUS (SEPSIS)
1000.0000 mL | Freq: Once | INTRAVENOUS | Status: AC
  Administered 2015-04-26: 1000 mL via INTRAVENOUS

## 2015-04-26 MED ORDER — METHOCARBAMOL 500 MG PO TABS: 1000.0000 mg | ORAL_TABLET | Freq: Three times a day (TID) | ORAL | Status: DC | PRN

## 2015-04-26 NOTE — ED Notes (Signed)
RN called main lab to follow up on pt.'s blood tests results , advised RN that they did not received the blood.

## 2015-04-26 NOTE — ED Provider Notes (Signed)
CSN: [id number]     Arrival date & time 04/25/15  1941 History  By signing my name below, I, Evelene Croon, attest that this documentation has been prepared under the direction and in the presence of Julianne Rice, MD . Electronically Signed: Evelene Croon, Scribe. 04/26/2015. 2:05 AM.    Chief Complaint  Patient presents with  . Back Pain  . Headache    The history is provided by the patient. No language interpreter was used.     HPI Comments:  Mary Mathis is a 40 y.o. female who presents to the Emergency Department complaining of HA with pain to right temporal region onset ~ 0400 yesterday AM (04/25/15). Pt notes the pain she is experiencing is dissimilar to past HAs because of the location of the pain. She has taken ibuprofen advil and BC with temporary relief.   Pt also complains of intermittent left flank pain since yesterday (04/25/15) as well. She reports associated nausea. Pt denies  vomiting, sore throat, sinus pressure,  lower abdominal pain, dysuria, difficulty urinating, vision change, and recent fall. Her last nml BM was 1 day ago.    Past Medical History  Diagnosis Date  . Cholecystitis   . Hypertension    Past Surgical History  Procedure Laterality Date  . Cholecystectomy  05/04/2011    Procedure: LAPAROSCOPIC CHOLECYSTECTOMY;  Surgeon: Harl Bowie, MD;  Location: [facility];  Service: General;  Laterality: N/A;  . Intraoperative cholangiogram  05/04/2011    Procedure: INTRAOPERATIVE CHOLANGIOGRAM;  Surgeon: Harl Bowie, MD;  Location: [facility];  Service: General;  Laterality: N/A;   Family History  Problem Relation Age of Onset  . Hypertension Mother   . Diabetes Father   . Hypertension Father    Social History  Substance Use Topics  . Smoking status: Never Smoker   . Smokeless tobacco: Never Used  . Alcohol Use: No   OB History    No data available     Review of Systems  Constitutional: Negative for fever and chills.  HENT: Negative for  congestion, sinus pressure and sore throat.   Eyes: Negative for photophobia and visual disturbance.  Respiratory: Negative for shortness of breath.   Cardiovascular: Negative for chest pain.  Gastrointestinal: Positive for nausea and abdominal pain. Negative for vomiting, diarrhea and constipation.  Genitourinary: Positive for flank pain and vaginal bleeding. Negative for dysuria, frequency, hematuria, difficulty urinating and pelvic pain.  Musculoskeletal: Positive for back pain. Negative for neck pain and neck stiffness.  Skin: Negative for rash and wound.  Neurological: Positive for headaches. Negative for dizziness, weakness and numbness.  All other systems reviewed and are negative.  Allergies  Review of patient's allergies indicates no known allergies.  Home Medications   Prior to Admission medications   Medication Sig Start Date End Date Taking? Authorizing Provider  ibuprofen (ADVIL,MOTRIN) 600 MG tablet Take 1 tablet (600 mg total) by mouth every 6 (six) hours as needed. 04/26/15   Julianne Rice, MD  lisinopril-hydrochlorothiazide (PRINZIDE,ZESTORETIC) 10-12.5 MG per tablet Take 1 tablet by mouth daily. Patient not taking: Reported on 04/26/2015 07/06/14   Arnoldo Morale, MD  methocarbamol (ROBAXIN) 500 MG tablet Take 2 tablets (1,000 mg total) by mouth every 8 (eight) hours as needed for muscle spasms. 04/26/15   Julianne Rice, MD  naproxen (NAPROSYN) 500 MG tablet Take 1 tablet (500 mg total) by mouth 2 (two) times daily with a meal. Patient not taking: Reported on 04/26/2015 07/20/14   Arnoldo Morale, MD  BP 135/92 mmHg  Pulse 88  Temp(Src) 98.1 F (36.7 C) (Oral)  Resp 14  Ht 5\' 7"  (1.702 m)  Wt 327 lb 9 oz (148.581 kg)  BMI 51.29 kg/m2  SpO2 98%  LMP 04/25/2015 (Exact Date) Physical Exam  Constitutional: She is oriented to person, place, and time. She appears well-developed and well-nourished. No distress.  HENT:  Head: Normocephalic and atraumatic.  Mouth/Throat:  Oropharynx is clear and moist. No oropharyngeal exudate.  Patient has tenderness to palpation in the right temporalis muscle region. There is no obvious deformity or injury. No specific temporal artery tenderness. Mastoid tenderness or erythema  Eyes: EOM are normal. Pupils are equal, round, and reactive to light.  Neck: Normal range of motion. Neck supple.  No meningismus.  Cardiovascular: Normal rate and regular rhythm.   Pulmonary/Chest: Effort normal and breath sounds normal. No respiratory distress. She has no wheezes. She has no rales.  Abdominal: Soft. Bowel sounds are normal. She exhibits no distension and no mass. There is no tenderness. There is no rebound and no guarding.  Musculoskeletal: Normal range of motion. She exhibits no edema or tenderness.  No CVA tenderness bilaterally.  Neurological: She is alert and oriented to person, place, and time.  Patient is alert and oriented x3 with clear, goal oriented speech. Patient has 5/5 motor in all extremities. Sensation is intact to light touch. Bilateral finger-to-nose is normal with no signs of dysmetria. Patient has a normal gait and walks without assistance.  Skin: Skin is warm and dry. No rash noted. No erythema.  Psychiatric: She has a normal mood and affect. Her behavior is normal.  Nursing note and vitals reviewed.   ED Course  Procedures   DIAGNOSTIC STUDIES:  Oxygen Saturation is 97% on RA, normal by my interpretation.    COORDINATION OF CARE:  1:45 AM Discussed treatment plan with pt at bedside and pt agreed to plan.  Labs Review Labs Reviewed  URINALYSIS, ROUTINE W REFLEX MICROSCOPIC (NOT AT [facility]) - Abnormal; Notable for the following:    Color, Urine AMBER (*)    APPearance CLOUDY (*)    Hgb urine dipstick LARGE (*)    Leukocytes, UA SMALL (*)    All other components within normal limits  URINE MICROSCOPIC-ADD ON - Abnormal; Notable for the following:    Squamous Epithelial / LPF 0-5 (*)    Bacteria, UA RARE  (*)    All other components within normal limits  CBC WITH DIFFERENTIAL/PLATELET - Abnormal; Notable for the following:    Hemoglobin 11.8 (*)    All other components within normal limits  COMPREHENSIVE METABOLIC PANEL - Abnormal; Notable for the following:    Potassium 3.4 (*)    Total Bilirubin 0.2 (*)    All other components within normal limits  LIPASE, BLOOD  I-STAT BETA HCG BLOOD, ED ([facility], [facility], [facility] ONLY)    Imaging Review No results found. I have personally reviewed and evaluated these images and lab results as part of my medical decision-making.   EKG Interpretation None      MDM   Final diagnoses:  Tension-type headache, not intractable, unspecified chronicity pattern  Colicky LUQ abdominal pain   I personally performed the services described in this documentation, which was scribed in my presence. The recorded information has been reviewed and is accurate.   Right temporal headache appears more consistent with tension-type headache. Patient has a normal neurologic exam. Abdominal labs are pending but anticipate discharge home.   Patient's headache is  resolved. No abdominal pain. Laboratory exam within normal limits. These have a normal neurologic exam. Benign abdominal exam. Return precautions given.  Julianne Rice, MD 04/26/15 [phone]

## 2015-04-26 NOTE — Discharge Instructions (Signed)
Abdominal Pain, Adult Many things can cause abdominal pain. Usually, abdominal pain is not caused by a disease and will improve without treatment. It can often be observed and treated at home. Your health care provider will do a physical exam and possibly order blood tests and X-rays to help determine the seriousness of your pain. However, in many cases, more time must pass before a clear cause of the pain can be found. Before that point, your health care provider may not know if you need more testing or further treatment. HOME CARE INSTRUCTIONS Monitor your abdominal pain for any changes. The following actions may help to alleviate any discomfort you are experiencing:  Only take over-the-counter or prescription medicines as directed by your health care provider.  Do not take laxatives unless directed to do so by your health care provider.  Try a clear liquid diet (broth, tea, or water) as directed by your health care provider. Slowly move to a bland diet as tolerated. SEEK MEDICAL CARE IF:  You have unexplained abdominal pain.  You have abdominal pain associated with nausea or diarrhea.  You have pain when you urinate or have a bowel movement.  You experience abdominal pain that wakes you in the night.  You have abdominal pain that is worsened or improved by eating food.  You have abdominal pain that is worsened with eating fatty foods.  You have a fever. SEEK IMMEDIATE MEDICAL CARE IF:  Your pain does not go away within 2 hours.  You keep throwing up (vomiting).  Your pain is felt only in portions of the abdomen, such as the right side or the left lower portion of the abdomen.  You pass bloody or black tarry stools. MAKE SURE YOU:  Understand these instructions.  Will watch your condition.  Will get help right away if you are not doing well or get worse.   This information is not intended to replace advice given to you by your health care provider. Make sure you discuss  any questions you have with your health care provider.   Document Released: 11/26/2004 Document Revised: 11/07/2014 Document Reviewed: 10/26/2012 Elsevier Interactive Patient Education 2016 Elsevier Inc.  Tension Headache A tension headache is a feeling of pain, pressure, or aching that is often felt over the front and sides of the head. The pain can be dull, or it can feel tight (constricting). Tension headaches are not normally associated with nausea or vomiting, and they do not get worse with physical activity. Tension headaches can last from 30 minutes to several days. This is the most common type of headache. CAUSES The exact cause of this condition is not known. Tension headaches often begin after stress, anxiety, or depression. Other triggers may include:  Alcohol.  Too much caffeine, or caffeine withdrawal.  Respiratory infections, such as colds, flu, or sinus infections.  Dental problems or teeth clenching.  Fatigue.  Holding your head and neck in the same position for a long period of time, such as while using a computer.  Smoking. SYMPTOMS Symptoms of this condition include:  A feeling of pressure around the head.  Dull, aching head pain.  Pain felt over the front and sides of the head.  Tenderness in the muscles of the head, neck, and shoulders. DIAGNOSIS This condition may be diagnosed based on your symptoms and a physical exam. Tests may be done, such as a CT scan or an MRI of your head. These tests may be done if your symptoms are severe or  unusual. TREATMENT This condition may be treated with lifestyle changes and medicines to help relieve symptoms. HOME CARE INSTRUCTIONS Managing Pain  Take over-the-counter and prescription medicines only as told by your health care provider.  Lie down in a dark, quiet room when you have a headache.  If directed, apply ice to the head and neck area:  Put ice in a plastic bag.  Place a towel between your skin and the  bag.  Leave the ice on for 20 minutes, 2-3 times per day.  Use a heating pad or a hot shower to apply heat to the head and neck area as told by your health care provider. Eating and Drinking  Eat meals on a regular schedule.  Limit alcohol use.  Decrease your caffeine intake, or stop using caffeine. General Instructions  Keep all follow-up visits as told by your health care provider. This is important.  Keep a headache journal to help find out what may trigger your headaches. For example, write down:  What you eat and drink.  How much sleep you get.  Any change to your diet or medicines.  Try massage or other relaxation techniques.  Limit stress.  Sit up straight, and avoid tensing your muscles.  Do not use tobacco products, including cigarettes, chewing tobacco, or e-cigarettes. If you need help quitting, ask your health care provider.  Exercise regularly as told by your health care provider.  Get 7-9 hours of sleep, or the amount recommended by your health care provider. SEEK MEDICAL CARE IF:  Your symptoms are not helped by medicine.  You have a headache that is different from what you normally experience.  You have nausea or you vomit.  You have a fever. SEEK IMMEDIATE MEDICAL CARE IF:  Your headache becomes severe.  You have repeated vomiting.  You have a stiff neck.  You have a loss of vision.  You have problems with speech.  You have pain in your eye or ear.  You have muscular weakness or loss of muscle control.  You lose your balance or you have trouble walking.  You feel faint or you pass out.  You have confusion.   This information is not intended to replace advice given to you by your health care provider. Make sure you discuss any questions you have with your health care provider.   Document Released: 02/16/2005 Document Revised: 11/07/2014 Document Reviewed: 06/11/2014 Elsevier Interactive Patient Education [employer].

## 2015-05-27 ENCOUNTER — Emergency Department (HOSPITAL_COMMUNITY)
Admission: EM | Admit: 2015-05-27 | Discharge: 2015-05-27 | Disposition: A | Discharge status: Home / Self Care | Payer: MEDICAID | Attending: Emergency Medicine | Admitting: Emergency Medicine

## 2015-05-27 ENCOUNTER — Encounter (HOSPITAL_COMMUNITY): Payer: Self-pay | Admitting: *Deleted

## 2015-05-27 DIAGNOSIS — Z8719 Personal history of other diseases of the digestive system: Secondary | ICD-10-CM | POA: Insufficient documentation

## 2015-05-27 DIAGNOSIS — L270 Generalized skin eruption due to drugs and medicaments taken internally: Secondary | ICD-10-CM

## 2015-05-27 DIAGNOSIS — I10 Essential (primary) hypertension: Secondary | ICD-10-CM | POA: Insufficient documentation

## 2015-05-27 DIAGNOSIS — T360X5A Adverse effect of penicillins, initial encounter: Secondary | ICD-10-CM | POA: Insufficient documentation

## 2015-05-27 DIAGNOSIS — L271 Localized skin eruption due to drugs and medicaments taken internally: Secondary | ICD-10-CM | POA: Insufficient documentation

## 2015-05-27 DIAGNOSIS — R21 Rash and other nonspecific skin eruption: Secondary | ICD-10-CM

## 2015-05-27 DIAGNOSIS — Z79899 Other long term (current) drug therapy: Secondary | ICD-10-CM | POA: Insufficient documentation

## 2015-05-27 MED ORDER — PREDNISONE 20 MG PO TABS: 40.0000 mg | ORAL_TABLET | Freq: Every day | ORAL | Status: DC

## 2015-05-27 MED ORDER — HYDROXYZINE HCL 25 MG PO TABS
25.0000 mg | ORAL_TABLET | Freq: Once | ORAL | Status: AC
  Administered 2015-05-27: 25 mg via ORAL
  Filled 2015-05-27: qty 1

## 2015-05-27 MED ORDER — TRIAMCINOLONE ACETONIDE 0.5 % EX CREA: 1.0000 "application " | TOPICAL_CREAM | Freq: Three times a day (TID) | CUTANEOUS | Status: DC

## 2015-05-27 MED ORDER — FAMOTIDINE 20 MG PO TABS
20.0000 mg | ORAL_TABLET | Freq: Once | ORAL | Status: AC
  Administered 2015-05-27: 20 mg via ORAL
  Filled 2015-05-27: qty 1

## 2015-05-27 MED ORDER — HYDROXYZINE HCL 25 MG PO TABS: 25.0000 mg | ORAL_TABLET | Freq: Four times a day (QID) | ORAL | Status: DC

## 2015-05-27 MED ORDER — FAMOTIDINE 20 MG PO TABS: 20.0000 mg | ORAL_TABLET | Freq: Two times a day (BID) | ORAL | Status: DC

## 2015-05-27 MED ORDER — METHYLPREDNISOLONE SODIUM SUCC 125 MG IJ SOLR
125.0000 mg | Freq: Once | INTRAMUSCULAR | Status: AC
  Administered 2015-05-27: 125 mg via INTRAMUSCULAR
  Filled 2015-05-27: qty 2

## 2015-05-27 NOTE — Discharge Instructions (Signed)
Drug Rash °A drug rash is a change in the color or texture of the skin that is caused by a drug. It can develop minutes, hours, or days after the person takes the drug. °CAUSES °This condition is usually caused by a drug allergy. It can also be caused by exposure to sunlight after taking a drug that makes the skin sensitive to light. Drugs that commonly cause rashes include: °· Penicillin. °· Antibiotic medicines. °· Medicines that treat seizures. °· Medicines that treat cancer (chemotherapy). °· Aspirin and other nonsteroidal anti-inflammatory drugs (NSAIDs). °· Injectable dyes that contain iodine. °· Insulin. °SYMPTOMS °Symptoms of this condition include: °· Redness. °· Tiny bumps. °· Peeling. °· Itching. °· Itchy welts (hives). °· Swelling. °The rash may appear on a small area of skin or all over the body. °DIAGNOSIS °To diagnose the condition, your health care provider will do a physical exam. He or she may also order tests to find out which drug caused the rash. Tests to find the cause of a rash include: °· Skin tests. °· Blood tests. °· Drug challenge. For this test, you stop taking all of the drugs that you do not need to take, and then you start taking them again by adding back one of the drugs at a time. °TREATMENT °A drug rash may be treated with medicines, including: °· Antihistamines. These may be given to relieve itching. °· An NSAID. This may be given to reduce swelling and treat pain. °· A steroid drug. This may be given to reduce swelling. °The rash usually goes away when the person stops taking the drug that caused it. °HOME CARE INSTRUCTIONS °· Take medicines only as directed by your health care provider. °· Let all of your health care providers know about any drug reactions you have had in the past. °· If you have hives, take a cool shower or use a cool compress to relieve itchiness. °SEEK MEDICAL CARE IF: °· You have a fever. °· Your rash is not going away. °· Your rash gets worse. °· Your rash  comes back. °· You have wheezing or coughing. °SEEK IMMEDIATE MEDICAL CARE IF: °· You start to have breathing problems. °· You start to have shortness of breath. °· You face or throat starts to swell. °· You have severe weakness with dizziness or fainting. °· You have chest pain. °  °This information is not intended to replace advice given to you by your health care provider. Make sure you discuss any questions you have with your health care provider. °  °Document Released: 03/26/2004 Document Revised: 03/09/2014 Document Reviewed: 12/13/2013 °Elsevier Interactive Patient Education ©2016 Elsevier Inc. ° °

## 2015-05-27 NOTE — ED Provider Notes (Signed)
CSN: [id number]     Arrival date & time 05/27/15  1526 History  By signing my name below, I, Soijett Blue, attest that this documentation has been prepared under the direction and in the presence of Dino Borntreger Y. Kayliah Tindol, PA-C Electronically Signed: [place], ED Scribe. 05/27/2015. 6:25 PM.   Chief Complaint  Patient presents with  . Allergic Reaction  . Joint Swelling      The history is provided by the patient. No language interpreter was used.    HPI Comments: Mary Mathis is a 40 y.o. female who presents to the Emergency Department complaining of allergic reaction onset 6 days. Pt reports that she was taking amoxicillin 1 week ago for a dental infection. Pt stopped taking the abx 6 days ago due to itching/rash/swelling to bilateral ankles and feet. Pt notes that the rash has since spread due to her scratching the area, but she denies having blisters to the area. Pt denies any new food or travel since taking the amoxicillin. Pt notes that she had an appointment scheduled for today and they are aware that she is not on any abx at this time.   She states that she is having associated symptoms of bilateral itching/rash/swelling to ankles, feet, and hands. She states that she has tried topical/liquid benadryl and hydrocortisone cream with mild relief for her symptoms. She denies trouble swallowing, fever, chills, tongue swelling, throat swelling, rash to chest or back, fever, chills, n/v, and any other symptoms.   Past Medical History  Diagnosis Date  . Cholecystitis   . Hypertension    Past Surgical History  Procedure Laterality Date  . Cholecystectomy  05/04/2011    Procedure: LAPAROSCOPIC CHOLECYSTECTOMY;  Surgeon: Harl Bowie, MD;  Location: [facility];  Service: General;  Laterality: N/A;  . Intraoperative cholangiogram  05/04/2011    Procedure: INTRAOPERATIVE CHOLANGIOGRAM;  Surgeon: Harl Bowie, MD;  Location: [facility];  Service: General;  Laterality: N/A;   Family History   Problem Relation Age of Onset  . Hypertension Mother   . Diabetes Father   . Hypertension Father    Social History  Substance Use Topics  . Smoking status: Never Smoker   . Smokeless tobacco: Never Used  . Alcohol Use: No   OB History    No data available     Review of Systems  Constitutional: Negative for fever and chills.  HENT: Negative for trouble swallowing.   Respiratory: Negative for shortness of breath.   Gastrointestinal: Negative for nausea and vomiting.  Musculoskeletal: Positive for joint swelling.  Skin: Positive for rash.       Itching to bilateral hands, feet, and ankles.  All other systems reviewed and are negative.     Allergies  Review of patient's allergies indicates no known allergies.  Home Medications   Prior to Admission medications   Medication Sig Start Date End Date Taking? Authorizing Provider  ibuprofen (ADVIL,MOTRIN) 600 MG tablet Take 1 tablet (600 mg total) by mouth every 6 (six) hours as needed. 04/26/15   Julianne Rice, MD  lisinopril-hydrochlorothiazide (PRINZIDE,ZESTORETIC) 10-12.5 MG per tablet Take 1 tablet by mouth daily. Patient not taking: Reported on 04/26/2015 07/06/14   Arnoldo Morale, MD  methocarbamol (ROBAXIN) 500 MG tablet Take 2 tablets (1,000 mg total) by mouth every 8 (eight) hours as needed for muscle spasms. 04/26/15   Julianne Rice, MD  naproxen (NAPROSYN) 500 MG tablet Take 1 tablet (500 mg total) by mouth 2 (two) times daily with a meal. Patient  not taking: Reported on 04/26/2015 07/20/14   Arnoldo Morale, MD   BP 136/90 mmHg  Pulse 96  Temp(Src) 99 F (37.2 C) (Oral)  Resp 18  SpO2 99%  LMP 05/20/2015 Physical Exam  Constitutional: She is oriented to person, place, and time. She appears well-developed and well-nourished. No distress.  HENT:  Head: Normocephalic and atraumatic.  Mouth/Throat: Uvula is midline. No trismus in the jaw.  Eyes: EOM are normal.  Neck: Neck supple.  Cardiovascular: Normal rate.    Pulmonary/Chest: Effort normal. No respiratory distress.  Musculoskeletal: Normal range of motion.  Bilateral hands and feet with 1+ edema. Bilateral hands have faint erythematous macular rash with a few excoriation marks. No hives, vesicles, or pustules. Ankles with erythematous maculopapular cobble stoning rash with excoriation marks.   Neurological: She is alert and oriented to person, place, and time.  Skin: Skin is warm and dry. Rash noted. Rash is macular and maculopapular. Rash is not vesicular. There is erythema.  Psychiatric: She has a normal mood and affect. Her behavior is normal.  Nursing note and vitals reviewed.   ED Course  Procedures (including critical care time) DIAGNOSTIC STUDIES: Oxygen Saturation is 99% on RA, nl by my interpretation.    COORDINATION OF CARE: 6:23 PM Discussed treatment plan with pt at bedside which includes continue benadryl use and pt agreed to plan.    Labs Review Labs Reviewed - No data to display  Imaging Review No results found.    EKG Interpretation None      MDM   Final diagnoses:  Rash  Drug eruption    Likely drug eruption. Pt tolerating secretions, no angioedema, uvula is midline. No increased WOB or tachypnea. Solu-medrol, pecid, and vistaril given in the ED. Rx given for few more days of prednisone, pepcid. Vistaril and kenalog cream prn. Strict ER return precautions given. Holding off on further abx therapy at this time. Pt has dental f/u scheduled.  I personally performed the services described in this documentation, which was scribed in my presence. The recorded information has been reviewed and is accurate.   Anne Ng, PA-C 05/28/15 [place], DO 05/28/15 [id number]

## 2015-05-27 NOTE — ED Notes (Signed)
Pt reports that she was taking amoxicillin for dental infection. Stopped it on wed due to itching, rash and swelling to bilateral ankles and feet. Also had rash to arms and hands but that has improved. No relief with topical benadryl. Airway intact.

## 2015-10-18 ENCOUNTER — Encounter (HOSPITAL_COMMUNITY): Payer: Self-pay | Admitting: Emergency Medicine

## 2015-10-18 ENCOUNTER — Emergency Department (HOSPITAL_COMMUNITY): Payer: Self-pay

## 2015-10-18 DIAGNOSIS — M546 Pain in thoracic spine: Secondary | ICD-10-CM | POA: Insufficient documentation

## 2015-10-18 DIAGNOSIS — Z7952 Long term (current) use of systemic steroids: Secondary | ICD-10-CM | POA: Insufficient documentation

## 2015-10-18 DIAGNOSIS — M2550 Pain in unspecified joint: Secondary | ICD-10-CM | POA: Insufficient documentation

## 2015-10-18 DIAGNOSIS — I10 Essential (primary) hypertension: Secondary | ICD-10-CM | POA: Insufficient documentation

## 2015-10-18 DIAGNOSIS — Z79899 Other long term (current) drug therapy: Secondary | ICD-10-CM | POA: Insufficient documentation

## 2015-10-18 DIAGNOSIS — R0789 Other chest pain: Secondary | ICD-10-CM | POA: Insufficient documentation

## 2015-10-18 DIAGNOSIS — R200 Anesthesia of skin: Secondary | ICD-10-CM | POA: Insufficient documentation

## 2015-10-18 DIAGNOSIS — Z791 Long term (current) use of non-steroidal anti-inflammatories (NSAID): Secondary | ICD-10-CM | POA: Insufficient documentation

## 2015-10-18 NOTE — ED Triage Notes (Signed)
Pt arrives by POV with c/o chest pain and mid back pain since Wednesday.  Pt has numbness and tingling down left arm on occassions.  Pt stated that the pains from the chest seem to shoot down to her mid back and feels like something stabbing her in her back.  Some dizziness and lightheadedness, denies N/V.

## 2015-10-19 ENCOUNTER — Emergency Department (HOSPITAL_COMMUNITY)
Admission: EM | Admit: 2015-10-19 | Discharge: 2015-10-19 | Disposition: A | Discharge status: Home / Self Care | Payer: Self-pay | Attending: Emergency Medicine | Admitting: Emergency Medicine

## 2015-10-19 DIAGNOSIS — R0789 Other chest pain: Secondary | ICD-10-CM

## 2015-10-19 DIAGNOSIS — M546 Pain in thoracic spine: Secondary | ICD-10-CM

## 2015-10-19 LAB — I-STAT TROPONIN, ED: Troponin i, poc: 0 ng/mL (ref 0.00–0.08)

## 2015-10-19 LAB — CBC
HEMATOCRIT: 35.8 % — AB (ref 36.0–46.0)
HEMOGLOBIN: 11.7 g/dL — AB (ref 12.0–15.0)
MCH: 27.2 pg (ref 26.0–34.0)
MCHC: 32.7 g/dL (ref 30.0–36.0)
MCV: 83.3 fL (ref 78.0–100.0)
Platelets: 194 10*3/uL (ref 150–400)
RBC: 4.3 MIL/uL (ref 3.87–5.11)
RDW: 14.7 % (ref 11.5–15.5)
WBC: 8.3 10*3/uL (ref 4.0–10.5)

## 2015-10-19 LAB — BASIC METABOLIC PANEL
ANION GAP: 4 — AB (ref 5–15)
BUN: 10 mg/dL (ref 6–20)
CHLORIDE: 109 mmol/L (ref 101–111)
CO2: 24 mmol/L (ref 22–32)
Calcium: 8.9 mg/dL (ref 8.9–10.3)
Creatinine, Ser: 0.85 mg/dL (ref 0.44–1.00)
GFR calc Af Amer: >60 mL/min (ref >60)
GFR calc non Af Amer: >60 mL/min (ref >60)
GLUCOSE: 132 mg/dL — AB (ref 65–99)
POTASSIUM: 3.8 mmol/L (ref 3.5–5.1)
Sodium: 137 mmol/L (ref 135–145)

## 2015-10-19 MED ORDER — IBUPROFEN 800 MG PO TABS: 800.0000 mg | ORAL_TABLET | Freq: Three times a day (TID) | ORAL | 0 refills | Status: DC

## 2015-10-19 NOTE — ED Provider Notes (Signed)
[facility] DEPT Provider Note   CSN: [id number] Arrival date & time: 10/18/15  2321  By signing my name below, I, Gwenlyn Fudge, attest that this documentation has been prepared under the direction and in the presence of Leo Grosser, MD. Electronically Signed: Gwenlyn Fudge, ED Scribe. 10/19/15. 12:42 AM.   History   Chief Complaint Chief Complaint  Patient presents with  . Chest Pain  . Back Pain   The history is provided by the patient. No language interpreter was used.    HPI Comments: Mary Mathis is a 40 y.o. female with PMHx of HTN who presents to the Emergency Department complaining of sudden onset chest and back pain onset 3 days ago. Pt states she felt like she might be dehydrated and when she got up for some water she had a sharp pain in her central back. Pt states she also noticed sharp, right sided chest pain. She denies any exacerbating or relieving factors for her chest pain. Pt reports gradual onset numbness in her left arm today. Pt has not taken any medications for pain relief. She states she does not have great posture when she sits at her desk at work. Pt denies leg swelling, activity change. She denies recent travel, back or chest injuries, surgeries, or hx of DVT/PE.  Past Medical History:  Diagnosis Date  . Cholecystitis   . Hypertension     Patient Active Problem List   Diagnosis Date Noted  . Hypokalemia 07/20/2014  . Essential hypertension 07/06/2014  . Obesity 07/06/2014  . Pedal edema 07/06/2014  . Symptomatic cholelithiasis 04/14/2011    Past Surgical History:  Procedure Laterality Date  . CHOLECYSTECTOMY  05/04/2011   Procedure: LAPAROSCOPIC CHOLECYSTECTOMY;  Surgeon: Harl Bowie, MD;  Location: [facility];  Service: General;  Laterality: N/A;  . INTRAOPERATIVE CHOLANGIOGRAM  05/04/2011   Procedure: INTRAOPERATIVE CHOLANGIOGRAM;  Surgeon: Harl Bowie, MD;  Location: [facility];  Service: General;  Laterality: N/A;    OB History    No  data available       Home Medications    Prior to Admission medications   Medication Sig Start Date End Date Taking? Authorizing Provider  famotidine (PEPCID) 20 MG tablet Take 1 tablet (20 mg total) by mouth 2 (two) times daily. 05/27/15   Olivia Canter Sam, PA-C  hydrOXYzine (ATARAX/VISTARIL) 25 MG tablet Take 1 tablet (25 mg total) by mouth every 6 (six) hours. 05/27/15   Olivia Canter Sam, PA-C  ibuprofen (ADVIL,MOTRIN) 600 MG tablet Take 1 tablet (600 mg total) by mouth every 6 (six) hours as needed. 04/26/15   Julianne Rice, MD  lisinopril-hydrochlorothiazide (PRINZIDE,ZESTORETIC) 10-12.5 MG per tablet Take 1 tablet by mouth daily. Patient not taking: Reported on 04/26/2015 07/06/14   Arnoldo Morale, MD  methocarbamol (ROBAXIN) 500 MG tablet Take 2 tablets (1,000 mg total) by mouth every 8 (eight) hours as needed for muscle spasms. 04/26/15   Julianne Rice, MD  naproxen (NAPROSYN) 500 MG tablet Take 1 tablet (500 mg total) by mouth 2 (two) times daily with a meal. Patient not taking: Reported on 04/26/2015 07/20/14   Arnoldo Morale, MD  predniSONE (DELTASONE) 20 MG tablet Take 2 tablets (40 mg total) by mouth daily with breakfast. 05/27/15   Olivia Canter Sam, PA-C  triamcinolone cream (KENALOG) 0.5 % Apply 1 application topically 3 (three) times daily. 05/27/15   Anne Ng, PA-C    Family History Family History  Problem Relation Age of Onset  . Hypertension Mother   .  Diabetes Father   . Hypertension Father     Social History Social History  Substance Use Topics  . Smoking status: Never Smoker  . Smokeless tobacco: Never Used  . Alcohol use No     Allergies   Amoxicillin   Review of Systems Review of Systems  Constitutional: Negative for activity change and fever.  Cardiovascular: Positive for chest pain. Negative for leg swelling.  Musculoskeletal: Positive for arthralgias and back pain.  Neurological: Positive for numbness.  All other systems reviewed and are negative.   Physical  Exam Updated Vital Signs BP (!) 194/109 (BP Location: Right Arm)   Pulse 92   Temp 98.7 F (37.1 C) (Oral)   Resp 19   Ht 5\' 10"  (1.778 m)   LMP 10/05/2015   SpO2 100%   Physical Exam  Constitutional: She appears well-developed and well-nourished.  HENT:  Head: Normocephalic.  Eyes: Conjunctivae are normal.  Cardiovascular: Normal rate, regular rhythm and normal heart sounds.   Pulmonary/Chest: Effort normal and breath sounds normal. No respiratory distress. She has no wheezes. She has no rales.  Abdominal: She exhibits no distension.  Musculoskeletal: Normal range of motion.  Neurological: She is alert.  Skin: Skin is warm and dry.  Psychiatric: She has a normal mood and affect. Her behavior is normal.  Nursing note and vitals reviewed.   ED Treatments / Results  DIAGNOSTIC STUDIES: Oxygen Saturation is 100% on RA, normal by my interpretation.    COORDINATION OF CARE: 12:20 AM Discussed treatment plan with pt at bedside which includes lab work and EKG and pt agreed to plan.  Labs (all labs ordered are listed, but only abnormal results are displayed) Labs Reviewed  BASIC METABOLIC PANEL - Abnormal; Notable for the following:       Result Value   Glucose, Bld 132 (*)    Anion gap 4 (*)    All other components within normal limits  CBC - Abnormal; Notable for the following:    Hemoglobin 11.7 (*)    HCT 35.8 (*)    All other components within normal limits  I-STAT TROPOININ, ED    EKG  EKG Interpretation  Date/Time:  Friday October 18 2015 23:36:31 EDT Ventricular Rate:  87 PR Interval:    QRS Duration: 102 QT Interval:  376 QTC Calculation: 453 R Axis:   67 Text Interpretation:  Sinus rhythm Normal ECG No significant change since last tracing Confirmed by Ying Rocks MD, Jalissa Heinzelman [phone]) on 10/19/2015 12:49:16 AM       Radiology Dg Chest 2 View  Result Date: 10/19/2015 CLINICAL DATA:  Chest and back pain EXAM: CHEST  2 VIEW COMPARISON:  01/01/2013 FINDINGS:  Normal heart size and mediastinal contours. No acute infiltrate or edema. No effusion or pneumothorax. No acute osseous findings. IMPRESSION: Negative chest Electronically Signed   By: Monte Fantasia M.D.   On: 10/19/2015 00:15    Procedures Procedures (including critical care time)  Medications Ordered in ED Medications - No data to display   Initial Impression / Assessment and Plan / ED Course  I have reviewed the triage vital signs and the nursing notes.  Pertinent labs & imaging results that were available during my care of the patient were reviewed by me and considered in my medical decision making (see chart for details).  Clinical Course    40 y.o. female presents with Chest pain intermittently over the last several days accompanied by a mid back pain. Pain is mostly over the thoracic area  of the back and she notes when she slumps at her desk during the course of the day it feels worse when she stands up. Pain is highly atypical for cardiac etiology. Heart score is 1, troponin and EKG are unremarkable, Wells is 0 with no risk factors for PE.  Patient was recommended to take short course of scheduled NSAIDs and engage in early mobility as definitive treatment.   Final Clinical Impressions(s) / ED Diagnoses   Final diagnoses:  Acute bilateral thoracic back pain  Atypical chest pain    New Prescriptions New Prescriptions   IBUPROFEN (ADVIL,MOTRIN) 800 MG TABLET    Take 1 tablet (800 mg total) by mouth 3 (three) times daily.   I personally performed the services described in this documentation, which was scribed in my presence. The recorded information has been reviewed and is accurate.     Leo Grosser, MD 10/19/15 [phone]

## 2015-10-19 NOTE — ED Notes (Signed)
Pt states that she thinks she needs a physical. Pt states that when she is on her menstrual cycle she has back pain that is similar to the pain she was feeling today. Pt denies CP, SOB, nausea or back pain at time of assessment. Pt has clear lung sounds and normal heart sounds

## 2015-11-23 ENCOUNTER — Emergency Department (HOSPITAL_COMMUNITY): Payer: Self-pay

## 2015-11-23 ENCOUNTER — Encounter (HOSPITAL_COMMUNITY): Payer: Self-pay | Admitting: Emergency Medicine

## 2015-11-23 ENCOUNTER — Emergency Department (HOSPITAL_COMMUNITY)
Admission: EM | Admit: 2015-11-23 | Discharge: 2015-11-23 | Disposition: A | Discharge status: Home / Self Care | Payer: Self-pay | Attending: Emergency Medicine | Admitting: Emergency Medicine

## 2015-11-23 DIAGNOSIS — I10 Essential (primary) hypertension: Secondary | ICD-10-CM | POA: Insufficient documentation

## 2015-11-23 DIAGNOSIS — R42 Dizziness and giddiness: Secondary | ICD-10-CM

## 2015-11-23 DIAGNOSIS — R55 Syncope and collapse: Secondary | ICD-10-CM | POA: Insufficient documentation

## 2015-11-23 LAB — CBC
HCT: 36.2 % (ref 36.0–46.0)
Hemoglobin: 11.6 g/dL — ABNORMAL LOW (ref 12.0–15.0)
MCH: 26.1 pg (ref 26.0–34.0)
MCHC: 32 g/dL (ref 30.0–36.0)
MCV: 81.5 fL (ref 78.0–100.0)
Platelets: 313 10*3/uL (ref 150–400)
RBC: 4.44 MIL/uL (ref 3.87–5.11)
RDW: 14 % (ref 11.5–15.5)
WBC: 9.5 10*3/uL (ref 4.0–10.5)

## 2015-11-23 LAB — BASIC METABOLIC PANEL
Anion gap: 6 (ref 5–15)
BUN: 7 mg/dL (ref 6–20)
CALCIUM: 9 mg/dL (ref 8.9–10.3)
CO2: 26 mmol/L (ref 22–32)
CREATININE: 0.92 mg/dL (ref 0.44–1.00)
Chloride: 106 mmol/L (ref 101–111)
Glucose, Bld: 98 mg/dL (ref 65–99)
Potassium: 3.4 mmol/L — ABNORMAL LOW (ref 3.5–5.1)
SODIUM: 138 mmol/L (ref 135–145)

## 2015-11-23 LAB — TROPONIN I: Troponin I: <0.03 ng/mL

## 2015-11-23 LAB — URINALYSIS, ROUTINE W REFLEX MICROSCOPIC
Bilirubin Urine: NEGATIVE
Glucose, UA: NEGATIVE mg/dL
Hgb urine dipstick: NEGATIVE
Ketones, ur: NEGATIVE mg/dL
Nitrite: NEGATIVE
Protein, ur: NEGATIVE mg/dL
Specific Gravity, Urine: 1.003 — ABNORMAL LOW (ref 1.005–1.030)
pH: 6 (ref 5.0–8.0)

## 2015-11-23 LAB — CBG MONITORING, ED: GLUCOSE-CAPILLARY: 93 mg/dL (ref 65–99)

## 2015-11-23 LAB — URINE MICROSCOPIC-ADD ON: RBC / HPF: NONE SEEN RBC/hpf (ref 0–5)

## 2015-11-23 LAB — POC URINE PREG, ED: Preg Test, Ur: NEGATIVE

## 2015-11-23 MED ORDER — METRONIDAZOLE 500 MG PO TABS
2000.0000 mg | ORAL_TABLET | Freq: Once | ORAL | Status: AC
  Administered 2015-11-23: 2000 mg via ORAL
  Filled 2015-11-23: qty 4

## 2015-11-23 NOTE — ED Triage Notes (Signed)
Pt c/o episodes of dizziness, Nausea, urinary frequency at night, diaphoresis x 3 weeks. Pt sts some days she is fine, some days it happens 3-4 times. Pt sts she is feeling dizzy at this time. Pt A&Ox4 and ambulatory. Pt has high BP in triage and sts "It's always high when I get here but it'll come down before discharge." Denies chest pain, SOB.

## 2015-11-23 NOTE — ED Provider Notes (Signed)
[facility] DEPT Provider Note   CSN: [id number] Arrival date & time: 11/23/15  1641     History   Chief Complaint Chief Complaint  Patient presents with  . Dizziness  . Excessive Sweating    HPI Jannel D Lung is a 40 y.o. female.  The history is provided by the patient. No language interpreter was used.  Dizziness    Shacara D Wachsmuth is a 40 y.o. female who presents to the Emergency Department complaining of dizziness.  She reports 2-3 weeks of intermittent episodes of dizziness with blurred vision and excessive sweating.  Episodes wax and wane with no clear triggers and last about 15 minutes.  Today she had an episode while driving.  She denies any chest pain.  She does have associated fatigue and recent DOE.  She denies fever, cough, leg swelling or pain.  She denies any medical problems.  She states that she is often told she has elevated BP but does not take any BP meds.    Past Medical History:  Diagnosis Date  . Cholecystitis   . Hypertension     Patient Active Problem List   Diagnosis Date Noted  . Hypokalemia 07/20/2014  . Essential hypertension 07/06/2014  . Obesity 07/06/2014  . Pedal edema 07/06/2014  . Symptomatic cholelithiasis 04/14/2011    Past Surgical History:  Procedure Laterality Date  . CHOLECYSTECTOMY  05/04/2011   Procedure: LAPAROSCOPIC CHOLECYSTECTOMY;  Surgeon: Harl Bowie, MD;  Location: [facility];  Service: General;  Laterality: N/A;  . INTRAOPERATIVE CHOLANGIOGRAM  05/04/2011   Procedure: INTRAOPERATIVE CHOLANGIOGRAM;  Surgeon: Harl Bowie, MD;  Location: [facility];  Service: General;  Laterality: N/A;    OB History    No data available       Home Medications    Prior to Admission medications   Medication Sig Start Date End Date Taking? Authorizing Provider  ibuprofen (ADVIL,MOTRIN) 800 MG tablet Take 1 tablet (800 mg total) by mouth 3 (three) times daily. Patient not taking: Reported on 11/23/2015 10/19/15   Leo Grosser, MD    Family History Family History  Problem Relation Age of Onset  . Hypertension Mother   . Diabetes Father   . Hypertension Father     Social History Social History  Substance Use Topics  . Smoking status: Never Smoker  . Smokeless tobacco: Never Used  . Alcohol use No     Allergies   Amoxicillin   Review of Systems Review of Systems  Neurological: Positive for dizziness.  All other systems reviewed and are negative.    Physical Exam Updated Vital Signs BP 148/73   Pulse 87   Temp 98.7 F (37.1 C) (Oral)   Resp 16   LMP 11/12/2015   SpO2 100%   Physical Exam  Constitutional: She is oriented to person, place, and time. She appears well-developed and well-nourished.  HENT:  Head: Normocephalic and atraumatic.  Cardiovascular: Normal rate and regular rhythm.   No murmur heard. Pulmonary/Chest: Effort normal and breath sounds normal. No respiratory distress.  Abdominal: Soft. There is no tenderness. There is no rebound and no guarding.  Musculoskeletal: She exhibits no edema or tenderness.  Neurological: She is alert and oriented to person, place, and time.  Skin: Skin is warm and dry.  Psychiatric: She has a normal mood and affect. Her behavior is normal.  Nursing note and vitals reviewed.    ED Treatments / Results  Labs (all labs ordered are listed, but only abnormal results are  displayed) Labs Reviewed  BASIC METABOLIC PANEL - Abnormal; Notable for the following:       Result Value   Potassium 3.4 (*)    All other components within normal limits  CBC - Abnormal; Notable for the following:    Hemoglobin 11.6 (*)    All other components within normal limits  URINALYSIS, ROUTINE W REFLEX MICROSCOPIC (NOT AT [facility]) - Abnormal; Notable for the following:    APPearance CLOUDY (*)    Specific Gravity, Urine 1.003 (*)    Leukocytes, UA SMALL (*)    All other components within normal limits  URINE MICROSCOPIC-ADD ON - Abnormal; Notable for the  following:    Squamous Epithelial / LPF 0-5 (*)    Bacteria, UA RARE (*)    All other components within normal limits  TROPONIN I  CBG MONITORING, ED  Randolm Idol, ED  POC URINE PREG, ED    EKG  EKG Interpretation  Date/Time:  Saturday November 23 2015 16:57:19 EDT Ventricular Rate:  94 PR Interval:    QRS Duration: 99 QT Interval:  370 QTC Calculation: 463 R Axis:   38 Text Interpretation:  Sinus rhythm Confirmed by Hazle Coca [phone]) on 11/23/2015 6:24:21 PM Also confirmed by Hazle Coca [phone]), editor [place] [address], [place] [phone])  on 11/24/2015 12:17:43 PM       Radiology Dg Chest 2 View  Result Date: 11/23/2015 CLINICAL DATA:  Shortness of breath for 2 weeks.  Hypertension. EXAM: CHEST  2 VIEW COMPARISON:  October 19, 2015 FINDINGS: There is no edema or consolidation. The heart size and pulmonary vascularity are normal. No adenopathy. No bone lesions. IMPRESSION: No edema or consolidation. Electronically Signed   By: Lowella Grip III M.D.   On: 11/23/2015 19:08    Procedures Procedures (including critical care time)  Medications Ordered in ED Medications  metroNIDAZOLE (FLAGYL) tablet 2,000 mg (2,000 mg Oral Given 11/23/15 2127)     Initial Impression / Assessment and Plan / ED Course  I have reviewed the triage vital signs and the nursing notes.  Pertinent labs & imaging results that were available during my care of the patient were reviewed by me and considered in my medical decision making (see chart for details).  Clinical Course    Pt here with several weeks of waxing-waning dizziness/near syncopal type sxs.  She is asymptomatic in ED.  Presentation is not c/w ACS, CVA, PE, hypertensive urgency.  Discussed with pt importance of PCP follow up regarding hypertension, home care, return precautions.    Final Clinical Impressions(s) / ED Diagnoses   Final diagnoses:  Dizziness  Near syncope  Essential hypertension    New Prescriptions Discharge  Medication List as of 11/23/2015  9:18 PM       Quintella Reichert, MD 11/24/15 1552

## 2015-11-25 ENCOUNTER — Emergency Department (HOSPITAL_COMMUNITY): Payer: Self-pay

## 2015-11-25 ENCOUNTER — Encounter (HOSPITAL_COMMUNITY): Payer: Self-pay

## 2015-11-25 ENCOUNTER — Emergency Department (HOSPITAL_COMMUNITY)
Admission: EM | Admit: 2015-11-25 | Discharge: 2015-11-25 | Disposition: A | Discharge status: Home / Self Care | Payer: Self-pay | Attending: Emergency Medicine | Admitting: Emergency Medicine

## 2015-11-25 DIAGNOSIS — R42 Dizziness and giddiness: Secondary | ICD-10-CM | POA: Insufficient documentation

## 2015-11-25 DIAGNOSIS — R0602 Shortness of breath: Secondary | ICD-10-CM | POA: Insufficient documentation

## 2015-11-25 DIAGNOSIS — R002 Palpitations: Secondary | ICD-10-CM | POA: Insufficient documentation

## 2015-11-25 DIAGNOSIS — I10 Essential (primary) hypertension: Secondary | ICD-10-CM | POA: Insufficient documentation

## 2015-11-25 LAB — URINALYSIS, ROUTINE W REFLEX MICROSCOPIC
BILIRUBIN URINE: NEGATIVE
Glucose, UA: NEGATIVE mg/dL
Ketones, ur: NEGATIVE mg/dL
NITRITE: NEGATIVE
PROTEIN: NEGATIVE mg/dL
pH: 6 (ref 5.0–8.0)

## 2015-11-25 LAB — BASIC METABOLIC PANEL
ANION GAP: 6 (ref 5–15)
BUN: 6 mg/dL (ref 6–20)
CALCIUM: 8.9 mg/dL (ref 8.9–10.3)
CO2: 26 mmol/L (ref 22–32)
CREATININE: 0.87 mg/dL (ref 0.44–1.00)
Chloride: 109 mmol/L (ref 101–111)
GLUCOSE: 123 mg/dL — AB (ref 65–99)
Potassium: 3.4 mmol/L — ABNORMAL LOW (ref 3.5–5.1)
Sodium: 141 mmol/L (ref 135–145)

## 2015-11-25 LAB — CBC
HCT: 35.1 % — ABNORMAL LOW (ref 36.0–46.0)
HEMOGLOBIN: 10.9 g/dL — AB (ref 12.0–15.0)
MCH: 26.1 pg (ref 26.0–34.0)
MCHC: 31.1 g/dL (ref 30.0–36.0)
MCV: 84.2 fL (ref 78.0–100.0)
PLATELETS: 276 10*3/uL (ref 150–400)
RBC: 4.17 MIL/uL (ref 3.87–5.11)
RDW: 14.3 % (ref 11.5–15.5)
WBC: 8.2 10*3/uL (ref 4.0–10.5)

## 2015-11-25 LAB — POC URINE PREG, ED: PREG TEST UR: NEGATIVE

## 2015-11-25 LAB — URINE MICROSCOPIC-ADD ON

## 2015-11-25 LAB — TSH: TSH: 2.95 u[IU]/mL (ref 0.350–4.500)

## 2015-11-25 LAB — BRAIN NATRIURETIC PEPTIDE: B NATRIURETIC PEPTIDE 5: 17.6 pg/mL (ref 0.0–100.0)

## 2015-11-25 LAB — T4, FREE: FREE T4: 0.85 ng/dL (ref 0.61–1.12)

## 2015-11-25 LAB — I-STAT TROPONIN, ED: TROPONIN I, POC: 0 ng/mL (ref 0.00–0.08)

## 2015-11-25 LAB — D-DIMER, QUANTITATIVE (NOT AT ARMC): D DIMER QUANT: 0.53 ug{FEU}/mL — AB (ref 0.00–0.50)

## 2015-11-25 LAB — CBG MONITORING, ED: GLUCOSE-CAPILLARY: 77 mg/dL (ref 65–99)

## 2015-11-25 MED ORDER — IOPAMIDOL (ISOVUE-370) INJECTION 76%
INTRAVENOUS | Status: AC
  Administered 2015-11-25: 100 mL
  Filled 2015-11-25: qty 100

## 2015-11-25 MED ORDER — METOPROLOL TARTRATE 50 MG PO TABS: 50.0000 mg | ORAL_TABLET | Freq: Two times a day (BID) | ORAL | 0 refills | Status: DC

## 2015-11-25 NOTE — ED Notes (Signed)
Patient transported to CT 

## 2015-11-25 NOTE — ED Notes (Signed)
Patient transported to X-ray 

## 2015-11-25 NOTE — ED Notes (Signed)
Pt states no increased dizziness with change of position.

## 2015-11-25 NOTE — ED Notes (Signed)
Pt provided with d/c instructions at this time. Pt verbalizes understanding of d/c instructions as well as follow up procedure after d/c.  Pt provided with RX for metoprolol.  Pt verbalizes understanding of RX directions. Pt in no apparent distress at this time. Pt ambulatory at time of d/c.

## 2015-11-25 NOTE — ED Notes (Signed)
MD at bedside. 

## 2015-11-25 NOTE — Discharge Instructions (Signed)
Follow-up with the [facility] wellness clinic as already scheduled for Friday. Call make appointment to follow-up with cardiology. Return immediately for any worsening of your symptoms or concerns.

## 2015-11-25 NOTE — ED Provider Notes (Signed)
[facility] DEPT Provider Note   CSN: [id number] Arrival date & time: 11/25/15  1100     History   Chief Complaint Chief Complaint  Patient presents with  . Dizziness    HPI Mary Mathis is a 40 y.o. female.  HPI Patient presents with episodic lightheadedness starting to 3 weeks ago. Associated with dry mouth, sweating, dyspnea with exertion, fatigue. Denies chest pain, lower extremity swelling. Denies stimulant use including caffeine or energy drinks. Last menstrual period was 9/12 and patient describes the bleeding is heavy. No known exacerbating or alleviating factors for the patient's dizziness. Denies room spinning sensation, nausea or vomiting. Currently the patient is not dizzy. States she has been drinking lots of water and that her urine is clear. Past Medical History:  Diagnosis Date  . Cholecystitis   . Hypertension     Patient Active Problem List   Diagnosis Date Noted  . Hypokalemia 07/20/2014  . Essential hypertension 07/06/2014  . Obesity 07/06/2014  . Pedal edema 07/06/2014  . Symptomatic cholelithiasis 04/14/2011    Past Surgical History:  Procedure Laterality Date  . CHOLECYSTECTOMY  05/04/2011   Procedure: LAPAROSCOPIC CHOLECYSTECTOMY;  Surgeon: Harl Bowie, MD;  Location: [facility];  Service: General;  Laterality: N/A;  . INTRAOPERATIVE CHOLANGIOGRAM  05/04/2011   Procedure: INTRAOPERATIVE CHOLANGIOGRAM;  Surgeon: Harl Bowie, MD;  Location: [facility];  Service: General;  Laterality: N/A;    OB History    No data available       Home Medications    Prior to Admission medications   Medication Sig Start Date End Date Taking? Authorizing Provider  ibuprofen (ADVIL,MOTRIN) 800 MG tablet Take 1 tablet (800 mg total) by mouth 3 (three) times daily. Patient not taking: Reported on 11/23/2015 10/19/15   Leo Grosser, MD  metoprolol (LOPRESSOR) 50 MG tablet Take 1 tablet (50 mg total) by mouth 2 (two) times daily. 11/25/15   Julianne Rice,  MD    Family History Family History  Problem Relation Age of Onset  . Hypertension Mother   . Diabetes Father   . Hypertension Father     Social History Social History  Substance Use Topics  . Smoking status: Never Smoker  . Smokeless tobacco: Never Used  . Alcohol use No     Allergies   Amoxicillin   Review of Systems Review of Systems  Constitutional: Positive for diaphoresis and fatigue. Negative for chills and fever.  Respiratory: Positive for shortness of breath. Negative for cough and chest tightness.   Cardiovascular: Negative for chest pain, palpitations and leg swelling.  Gastrointestinal: Negative for abdominal pain, diarrhea, nausea and vomiting.  Genitourinary: Positive for frequency. Negative for difficulty urinating, dysuria and flank pain.  Musculoskeletal: Positive for back pain and myalgias. Negative for neck pain and neck stiffness.  Skin: Negative for rash and wound.  Neurological: Positive for dizziness, tremors, weakness (generalized) and light-headedness. Negative for numbness and headaches.  Psychiatric/Behavioral: The patient is nervous/anxious.   All other systems reviewed and are negative.    Physical Exam Updated Vital Signs BP 155/78   Pulse 84   Temp 98.5 F (36.9 C) (Oral)   Resp 23   Ht 5\' 9"  (1.753 m)   Wt (!) 312 lb (141.5 kg)   LMP 11/12/2015   SpO2 100%   BMI 46.07 kg/m   Physical Exam  Constitutional: She is oriented to person, place, and time. She appears well-developed and well-nourished. No distress.  HENT:  Head: Normocephalic and atraumatic.  Mouth/Throat:  Oropharynx is clear and moist.  Eyes: EOM are normal. Pupils are equal, round, and reactive to light.  No nystagmus  Neck: Normal range of motion. Neck supple. No thyromegaly present.  Cardiovascular: Normal rate and regular rhythm.  Exam reveals no gallop and no friction rub.   No murmur heard. Pulmonary/Chest: Effort normal and breath sounds normal. No  respiratory distress. She has no wheezes. She has no rales. She exhibits no tenderness.  Abdominal: Soft. Bowel sounds are normal. There is no tenderness. There is no rebound and no guarding.  Musculoskeletal: Normal range of motion. She exhibits no edema or tenderness.  No lower extremity swelling, asymmetry or tenderness.  Lymphadenopathy:    She has no cervical adenopathy.  Neurological: She is alert and oriented to person, place, and time.  Patient is alert and oriented x3 with clear, goal oriented speech. Patient has 5/5 motor in all extremities. Sensation is intact to light touch. Patient has a normal gait and walks without assistance.  Skin: Skin is warm and dry. No rash noted. No erythema.  Psychiatric: She has a normal mood and affect. Her behavior is normal.  Nursing note and vitals reviewed.    ED Treatments / Results  Labs (all labs ordered are listed, but only abnormal results are displayed) Labs Reviewed  BASIC METABOLIC PANEL - Abnormal; Notable for the following:       Result Value   Potassium 3.4 (*)    Glucose, Bld 123 (*)    All other components within normal limits  CBC - Abnormal; Notable for the following:    Hemoglobin 10.9 (*)    HCT 35.1 (*)    All other components within normal limits  URINALYSIS, ROUTINE W REFLEX MICROSCOPIC (NOT AT [facility]) - Abnormal; Notable for the following:    Specific Gravity, Urine <1.005 (*)    Hgb urine dipstick TRACE (*)    Leukocytes, UA TRACE (*)    All other components within normal limits  URINE MICROSCOPIC-ADD ON - Abnormal; Notable for the following:    Squamous Epithelial / LPF 6-30 (*)    Bacteria, UA RARE (*)    All other components within normal limits  D-DIMER, QUANTITATIVE (NOT AT [facility]) - Abnormal; Notable for the following:    D-Dimer, Quant 0.53 (*)    All other components within normal limits  TSH  T4, FREE  BRAIN NATRIURETIC PEPTIDE  CBG MONITORING, ED  POC URINE PREG, ED  I-STAT TROPOININ, ED    EKG   EKG Interpretation  Date/Time:  Monday November 25 2015 11:11:05 EDT Ventricular Rate:  92 PR Interval:  182 QRS Duration: 88 QT Interval:  386 QTC Calculation: 477 R Axis:   68 Text Interpretation:  Normal sinus rhythm Cannot rule out Anterior infarct , age undetermined Abnormal ECG Confirmed by Lita Mains  MD, Joannie Medine ([id number]) on 11/25/2015 3:09:05 PM       Radiology Dg Chest 2 View  Result Date: 11/25/2015 CLINICAL DATA:  Dizziness, vision blurriness, dehydration, sweating for 2 weeks. History of hypertension. EXAM: CHEST  2 VIEW COMPARISON:  Chest x-rays dated 11/23/2015 in 09/2017 2017. FINDINGS: The heart size and mediastinal contours are within normal limits. Both lungs are clear. Slight elevation of the right hemidiaphragm is stable. The visualized skeletal structures are unremarkable. IMPRESSION: No active cardiopulmonary disease. Electronically Signed   By: Franki Cabot M.D.   On: 11/25/2015 15:52   Ct Angio Chest Pe W And/or Wo Contrast  Result Date: 11/25/2015 CLINICAL DATA:  Lightheadedness.  Syncope. EXAM: CT ANGIOGRAPHY CHEST WITH CONTRAST TECHNIQUE: Multidetector CT imaging of the chest was performed using the standard protocol during bolus administration of intravenous contrast. Multiplanar CT image reconstructions and MIPs were obtained to evaluate the vascular anatomy. CONTRAST:  100 cc of Isovue 370 COMPARISON:  None FINDINGS: Cardiovascular: Satisfactory opacification of the pulmonary arteries to the segmental level. No evidence of pulmonary embolism. Normal heart size. No pericardial effusion. Mediastinum/Nodes: The trachea appears patent and is midline. Normal appearance of the esophagus. No enlarged mediastinal or hilar adenopathy. Lungs/Pleura: No pleural effusion. No airspace consolidation. No atelectasis. No suspicious pulmonary nodule or mass. Upper Abdomen: The adrenal glands are normal. The visualized portions of the liver and spleen are normal. Previous  cholecystectomy. Musculoskeletal: No chest wall abnormality. No acute or significant osseous findings. Review of the MIP images confirms the above findings. IMPRESSION: 1. No acute pulmonary embolus. 2. No acute cardiopulmonary abnormalities identified. Electronically Signed   By: Kerby Moors M.D.   On: 11/25/2015 18:35    Procedures Procedures (including critical care time)  Medications Ordered in ED Medications  iopamidol (ISOVUE-370) 76 % injection (100 mLs  Contrast Given 11/25/15 1748)     Initial Impression / Assessment and Plan / ED Course  I have reviewed the triage vital signs and the nursing notes.  Pertinent labs & imaging results that were available during my care of the patient were reviewed by me and considered in my medical decision making (see chart for details).  Clinical Course   Workup is essentially negative. Stable vital signs in the emergency department. Have recommended follow-up with her primary physician and with cardiology. May benefit from Holter monitor testing. Return precautions have been given.   Final Clinical Impressions(s) / ED Diagnoses   Final diagnoses:  Lightheadedness  Intermittent palpitations    New Prescriptions Discharge Medication List as of 11/25/2015  8:06 PM    START taking these medications   Details  metoprolol (LOPRESSOR) 50 MG tablet Take 1 tablet (50 mg total) by mouth 2 (two) times daily., Starting Mon 11/25/2015, Print         Julianne Rice, MD 11/26/15 2326

## 2015-11-25 NOTE — ED Triage Notes (Signed)
Pt reports dizziness X2 weeks. Pt reports she was seen at [facility] for same and told her BP was high. Pt is alert and oriented, pt also reports nausea and lightheadedness.

## 2015-11-25 NOTE — ED Notes (Signed)
Pt returned from CT °

## 2015-11-29 ENCOUNTER — Inpatient Hospital Stay: Payer: Self-pay

## 2015-11-29 ENCOUNTER — Encounter (HOSPITAL_COMMUNITY): Payer: Self-pay

## 2015-11-29 ENCOUNTER — Emergency Department (HOSPITAL_COMMUNITY)
Admission: EM | Admit: 2015-11-29 | Discharge: 2015-11-29 | Disposition: A | Discharge status: Home / Self Care | Payer: Self-pay | Attending: Physician Assistant | Admitting: Physician Assistant

## 2015-11-29 DIAGNOSIS — I10 Essential (primary) hypertension: Secondary | ICD-10-CM | POA: Insufficient documentation

## 2015-11-29 DIAGNOSIS — R5383 Other fatigue: Secondary | ICD-10-CM | POA: Insufficient documentation

## 2015-11-29 DIAGNOSIS — R197 Diarrhea, unspecified: Secondary | ICD-10-CM | POA: Insufficient documentation

## 2015-11-29 LAB — LIPASE, BLOOD: Lipase: 48 U/L (ref 11–51)

## 2015-11-29 LAB — COMPREHENSIVE METABOLIC PANEL
ALK PHOS: 75 U/L (ref 38–126)
ALT: 23 U/L (ref 14–54)
ANION GAP: 7 (ref 5–15)
AST: 23 U/L (ref 15–41)
Albumin: 3.9 g/dL (ref 3.5–5.0)
BUN: 8 mg/dL (ref 6–20)
CALCIUM: 9 mg/dL (ref 8.9–10.3)
CO2: 24 mmol/L (ref 22–32)
Chloride: 107 mmol/L (ref 101–111)
Creatinine, Ser: 0.96 mg/dL (ref 0.44–1.00)
GFR calc non Af Amer: >60 mL/min (ref >60)
Glucose, Bld: 120 mg/dL — ABNORMAL HIGH (ref 65–99)
Potassium: 3.5 mmol/L (ref 3.5–5.1)
SODIUM: 138 mmol/L (ref 135–145)
Total Bilirubin: 0.3 mg/dL (ref 0.3–1.2)
Total Protein: 7.6 g/dL (ref 6.5–8.1)

## 2015-11-29 LAB — CBC
HCT: 36.1 % (ref 36.0–46.0)
Hemoglobin: 11.4 g/dL — ABNORMAL LOW (ref 12.0–15.0)
MCH: 26.3 pg (ref 26.0–34.0)
MCHC: 31.6 g/dL (ref 30.0–36.0)
MCV: 83.4 fL (ref 78.0–100.0)
PLATELETS: 291 10*3/uL (ref 150–400)
RBC: 4.33 MIL/uL (ref 3.87–5.11)
RDW: 14 % (ref 11.5–15.5)
WBC: 7.8 10*3/uL (ref 4.0–10.5)

## 2015-11-29 LAB — I-STAT BETA HCG BLOOD, ED (MC, WL, AP ONLY)

## 2015-11-29 LAB — URINALYSIS, ROUTINE W REFLEX MICROSCOPIC
Bilirubin Urine: NEGATIVE
Glucose, UA: NEGATIVE mg/dL
Hgb urine dipstick: NEGATIVE
Ketones, ur: NEGATIVE mg/dL
LEUKOCYTES UA: NEGATIVE
NITRITE: NEGATIVE
PROTEIN: NEGATIVE mg/dL
Specific Gravity, Urine: 1.006 (ref 1.005–1.030)
pH: 6 (ref 5.0–8.0)

## 2015-11-29 LAB — CBG MONITORING, ED: Glucose-Capillary: 119 mg/dL — ABNORMAL HIGH (ref 65–99)

## 2015-11-29 MED ORDER — ONDANSETRON HCL 4 MG PO TABS
4.0000 mg | ORAL_TABLET | Freq: Once | ORAL | Status: AC
  Administered 2015-11-29: 4 mg via ORAL
  Filled 2015-11-29: qty 1

## 2015-11-29 MED ORDER — ONDANSETRON HCL 4 MG PO TABS: 4.0000 mg | ORAL_TABLET | Freq: Three times a day (TID) | ORAL | 0 refills | Status: DC | PRN

## 2015-11-29 NOTE — ED Triage Notes (Signed)
Pt c/o dizziness and weakness x "2-3 weeks" and diarrhea starting today.  Denies pain.  Pt has been seen at [facility] x 2 recently for same. Sts "they ran a bunch of tests and couldn't find anything wrong."  Sts she has an appointment at [facility] today, but sts "I was just too weak to wait."  Pt has not followed up w/ Cardiology as instructed.

## 2015-11-29 NOTE — ED Provider Notes (Signed)
[place] DEPT Provider Note   CSN: [id number] Arrival date & time: 11/29/15  1247     History   Chief Complaint Chief Complaint  Patient presents with  . Dizziness  . Weakness  . Diarrhea    HPI Mary Mathis is a 40 y.o. female.  The history is provided by the patient.  Weakness  Pertinent negatives include no vomiting and no headaches.  Diarrhea   This is a new problem. The current episode started 3 to 5 hours ago. Episode frequency: twice today. The problem has not changed since onset.Diarrhea characteristics: soft serve. There has been no fever. Pertinent negatives include no abdominal pain, no vomiting, no chills, no headaches, no myalgias, no URI and no cough. She has tried nothing for the symptoms. The treatment provided no relief. Her past medical history does not include irritable bowel syndrome, inflammatory bowel disease or malabsorption.    Past Medical History:  Diagnosis Date  . Cholecystitis   . Hypertension     Patient Active Problem List   Diagnosis Date Noted  . Hypokalemia 07/20/2014  . Essential hypertension 07/06/2014  . Obesity 07/06/2014  . Pedal edema 07/06/2014  . Symptomatic cholelithiasis 04/14/2011    Past Surgical History:  Procedure Laterality Date  . CHOLECYSTECTOMY  05/04/2011   Procedure: LAPAROSCOPIC CHOLECYSTECTOMY;  Surgeon: Harl Bowie, MD;  Location: [facility]S;  Service: General;  Laterality: N/A;  . INTRAOPERATIVE CHOLANGIOGRAM  05/04/2011   Procedure: INTRAOPERATIVE CHOLANGIOGRAM;  Surgeon: Harl Bowie, MD;  Location: [facility];  Service: General;  Laterality: N/A;    OB History    No data available       Home Medications    Prior to Admission medications   Medication Sig Start Date End Date Taking? Authorizing Provider  metoprolol (LOPRESSOR) 50 MG tablet Take 1 tablet (50 mg total) by mouth 2 (two) times daily. 11/25/15  Yes Julianne Rice, MD  ibuprofen (ADVIL,MOTRIN) 800 MG tablet Take 1 tablet (800  mg total) by mouth 3 (three) times daily. Patient not taking: Reported on 11/29/2015 10/19/15   Leo Grosser, MD  ondansetron [facility]) 4 MG tablet Take 1 tablet (4 mg total) by mouth every 8 (eight) hours as needed for nausea or vomiting. 11/29/15   Gredmarie Delange Lyn Amauri Medellin, MD    Family History Family History  Problem Relation Age of Onset  . Hypertension Mother   . Diabetes Father   . Hypertension Father     Social History Social History  Substance Use Topics  . Smoking status: Never Smoker  . Smokeless tobacco: Never Used  . Alcohol use No     Allergies   Amoxicillin   Review of Systems Review of Systems  Constitutional: Positive for fatigue. Negative for chills and fever.  Respiratory: Negative for cough.   Gastrointestinal: Positive for diarrhea. Negative for abdominal pain and vomiting.  Musculoskeletal: Negative for myalgias.  Neurological: Negative for headaches.  All other systems reviewed and are negative.    Physical Exam Updated Vital Signs BP 150/91 (BP Location: Left Arm)   Pulse 69   Temp 98.4 F (36.9 C) (Oral)   Resp 15   Ht 5\' 9"  (1.753 m)   Wt (!) 312 lb (141.5 kg)   LMP 11/12/2015   SpO2 100%   BMI 46.07 kg/m   Physical Exam  Constitutional: She appears well-developed and well-nourished. No distress.  HENT:  Head: Normocephalic and atraumatic.  Eyes: Conjunctivae are normal.  Neck: Neck supple.  Cardiovascular: Normal rate and  regular rhythm.   No murmur heard. Pulmonary/Chest: Effort normal and breath sounds normal. No respiratory distress.  Abdominal: Soft. There is no tenderness.  Musculoskeletal: She exhibits no edema.  Neurological: She is alert.  Skin: Skin is warm and dry.  Psychiatric: She has a normal mood and affect.  Nursing note and vitals reviewed.    ED Treatments / Results  Labs (all labs ordered are listed, but only abnormal results are displayed) Labs Reviewed  CBC - Abnormal; Notable for the following:        Result Value   Hemoglobin 11.4 (*)    All other components within normal limits  COMPREHENSIVE METABOLIC PANEL - Abnormal; Notable for the following:    Glucose, Bld 120 (*)    All other components within normal limits  CBG MONITORING, ED - Abnormal; Notable for the following:    Glucose-Capillary 119 (*)    All other components within normal limits  URINALYSIS, ROUTINE W REFLEX MICROSCOPIC (NOT AT [facility])  LIPASE, BLOOD  I-STAT BETA HCG BLOOD, ED ([facility], [facility], [facility] ONLY)    EKG  EKG Interpretation None       Radiology No results found.  Procedures Procedures (including critical care time)  Medications Ordered in ED Medications  ondansetron (ZOFRAN) tablet 4 mg (4 mg Oral Given 11/29/15 1559)     Initial Impression / Assessment and Plan / ED Course  I have reviewed the triage vital signs and the nursing notes.  Pertinent labs & imaging results that were available during my care of the patient were reviewed by me and considered in my medical decision making (see chart for details).  Clinical Course    Patient's very pleasant 40 year old female presenting with diarrhea. She has had 2 episodes of soft stools today. She's had no blood in it. No nausea. Patient reports that she's had nausea and weakness for the last month. She reports that she has had extensive workup in the emergency department 2. She had an appointment today at 2:30 at [facility] but she came here instead.   Patient's chem 10 clotted however patient does not wish to wait for a redraw. I think this is reasonable given the fact she's had 3 recent visits with normal labs. She understands the risk of leaving without these blood work done.  Patient is comfortable, ambulatory, and taking PO at time of discharge.  Patient expressed understanding about return precautions.    Final Clinical Impressions(s) / ED Diagnoses   Final diagnoses:  Diarrhea, unspecified type    New Prescriptions Discharge Medication List  as of 11/29/2015  3:33 PM    START taking these medications   Details  ondansetron (ZOFRAN) 4 MG tablet Take 1 tablet (4 mg total) by mouth every 8 (eight) hours as needed for nausea or vomiting., Starting Fri 11/29/2015, Print         Micah Barnier Julio Alm, MD 11/30/15 2031

## 2015-11-29 NOTE — Discharge Instructions (Signed)
You were seen today for 2 episodes of loose stools. Please be sure to stay hydrated. Please use Zofran as needed to help with any nausea have. Please follow-up with [facility] for the month-long history of mild weakness. We gave you GI follow up as needed.

## 2015-12-05 ENCOUNTER — Ambulatory Visit: Payer: Self-pay | Attending: Internal Medicine | Admitting: Physician Assistant

## 2015-12-05 VITALS — BP 169/94 | HR 72 | Temp 98.4°F | Resp 16 | Wt 328.0 lb

## 2015-12-05 DIAGNOSIS — R1013 Epigastric pain: Secondary | ICD-10-CM | POA: Insufficient documentation

## 2015-12-05 DIAGNOSIS — I10 Essential (primary) hypertension: Secondary | ICD-10-CM | POA: Insufficient documentation

## 2015-12-05 DIAGNOSIS — Z880 Allergy status to penicillin: Secondary | ICD-10-CM | POA: Insufficient documentation

## 2015-12-05 DIAGNOSIS — R739 Hyperglycemia, unspecified: Secondary | ICD-10-CM | POA: Insufficient documentation

## 2015-12-05 LAB — POCT GLYCOSYLATED HEMOGLOBIN (HGB A1C): HEMOGLOBIN A1C: 5.8

## 2015-12-05 LAB — GLUCOSE, POCT (MANUAL RESULT ENTRY): POC GLUCOSE: 133 mg/dL — AB (ref 70–99)

## 2015-12-05 MED ORDER — METOPROLOL TARTRATE 50 MG PO TABS: 50.0000 mg | ORAL_TABLET | Freq: Two times a day (BID) | ORAL | 5 refills | Status: DC

## 2015-12-05 MED FILL — METOPROLOL TARTRATE 50 MG T: 50 | 30 days supply | Qty: 60 | Fill #0

## 2015-12-05 NOTE — Patient Instructions (Signed)
Hyperglycemia °Hyperglycemia occurs when the glucose (sugar) in your blood is too high. Hyperglycemia can happen for many reasons, but it most often happens to people who do not know they have diabetes or are not managing their diabetes properly.  °CAUSES  °Whether you have diabetes or not, there are other causes of hyperglycemia. Hyperglycemia can occur when you have diabetes, but it can also occur in other situations that you might not be as aware of, such as: °Diabetes °· If you have diabetes and are having problems controlling your blood glucose, hyperglycemia could occur because of some of the following reasons: °¨ Not following your meal plan. °¨ Not taking your diabetes medications or not taking it properly. °¨ Exercising less or doing less activity than you normally do. °¨ Being sick. °Pre-diabetes °· This cannot be ignored. Before people develop Type 2 diabetes, they almost always have "pre-diabetes." This is when your blood glucose levels are higher than normal, but not yet high enough to be diagnosed as diabetes. Research has shown that some long-term damage to the body, especially the heart and circulatory system, may already be occurring during pre-diabetes. If you take action to manage your blood glucose when you have pre-diabetes, you may delay or prevent Type 2 diabetes from developing. °Stress °· If you have diabetes, you may be "diet" controlled or on oral medications or insulin to control your diabetes. However, you may find that your blood glucose is higher than usual in the hospital whether you have diabetes or not. This is often referred to as "stress hyperglycemia." Stress can elevate your blood glucose. This happens because of hormones put out by the body during times of stress. If stress has been the cause of your high blood glucose, it can be followed regularly by your caregiver. That way he/she can make sure your hyperglycemia does not continue to get worse or progress to  diabetes. °Steroids °· Steroids are medications that act on the infection fighting system (immune system) to block inflammation or infection. One side effect can be a rise in blood glucose. Most people can produce enough extra insulin to allow for this rise, but for those who cannot, steroids make blood glucose levels go even higher. It is not unusual for steroid treatments to "uncover" diabetes that is developing. It is not always possible to determine if the hyperglycemia will go away after the steroids are stopped. A special blood test called an A1c is sometimes done to determine if your blood glucose was elevated before the steroids were started. °SYMPTOMS °· Thirsty. °· Frequent urination. °· Dry mouth. °· Blurred vision. °· Tired or fatigue. °· Weakness. °· Sleepy. °· Tingling in feet or leg. °DIAGNOSIS  °Diagnosis is made by monitoring blood glucose in one or all of the following ways: °· A1c test. This is a chemical found in your blood. °· Fingerstick blood glucose monitoring. °· Laboratory results. °TREATMENT  °First, knowing the cause of the hyperglycemia is important before the hyperglycemia can be treated. Treatment may include, but is not be limited to: °· Education. °· Change or adjustment in medications. °· Change or adjustment in meal plan. °· Treatment for an illness, infection, etc. °· More frequent blood glucose monitoring. °· Change in exercise plan. °· Decreasing or stopping steroids. °· Lifestyle changes. °HOME CARE INSTRUCTIONS  °· Test your blood glucose as directed. °· Exercise regularly. Your caregiver will give you instructions about exercise. Pre-diabetes or diabetes which comes on with stress is helped by exercising. °· Eat wholesome,   balanced meals. Eat often and at regular, fixed times. Your caregiver or nutritionist will give you a meal plan to guide your sugar intake. °· Being at an ideal weight is important. If needed, losing as little as 10 to 15 pounds may help improve blood  glucose levels. °SEEK MEDICAL CARE IF:  °· You have questions about medicine, activity, or diet. °· You continue to have symptoms (problems such as increased thirst, urination, or weight gain). °SEEK IMMEDIATE MEDICAL CARE IF:  °· You are vomiting or have diarrhea. °· Your breath smells fruity. °· You are breathing faster or slower. °· You are very sleepy or incoherent. °· You have numbness, tingling, or pain in your feet or hands. °· You have chest pain. °· Your symptoms get worse even though you have been following your caregiver's orders. °· If you have any other questions or concerns. °  °This information is not intended to replace advice given to you by your health care provider. Make sure you discuss any questions you have with your health care provider. °  °Document Released: 08/12/2000 Document Revised: 05/11/2011 Document Reviewed: 10/23/2014 °Elsevier Interactive Patient Education ©2016 [employer] Inc[employer] ° °

## 2015-12-05 NOTE — Progress Notes (Signed)
Mary Mathis, is a 40 y.o. female  [id number]  [id number]  DOB - 10/22/1975  Subjective:  Chief Complaint and HPI: Mary Mathis is a 40 y.o. female here today for a follow-up from being seen in the ED a few times for feeling weak/dizzy when she doesn't eat for too long.  She has also been having intermittent nausea and heartburn.  EKG and extensive labs have been overall WNL.  She says the episodes of feeling weak, dizzy, "jittery" occur when she hasn't eaten in a while/gets too hungry.  She has also noticed having blurred vision during these episodes.  However, the symptoms improve after she eats.  Other symptoms she c/o in the ED have resolved(abdominal pain and diarrhea).  Recent ED/Hospital notes reviewed.  Hyperglycemia is present without A1C.  She is out of her BP meds.  She denies CP, SOB, HAs, and Palpitations   ROS:   Constitutional:  No f/c, No night sweats, No unexplained weight loss. EENT:  No vision changes, No blurry vision, No hearing changes. No mouth, throat, or ear problems.  Respiratory: No cough, No SOB Cardiac: No CP, no palpitations GI:  No abd pain, Some Nausea, no V/D. GU: No Urinary s/sx Musculoskeletal: No joint pain Neuro: No headache, occasional dizziness as described above, no motor weakness.  Skin: No rash Endocrine:  No polydipsia. No polyuria.  Psych: Denies SI/HI  No problems updated.  ALLERGIES: Allergies  Allergen Reactions  . Amoxicillin Swelling and Rash    Swelling of the ankles and hands  Has patient had a PCN reaction causing immediate rash, facial/tongue/throat swelling, SOB or lightheadedness with hypotension: Yes Has patient had a PCN reaction causing severe rash involving mucus membranes or skin necrosis: No Has patient had a PCN reaction that required hospitalization Yes Has patient had a PCN reaction occurring within the last 10 years: Yes If all of the above answers are "NO", then may proceed with Cephalosporin  use.     PAST MEDICAL HISTORY: Past Medical History:  Diagnosis Date  . Cholecystitis   . Hypertension     MEDICATIONS AT HOME: Prior to Admission medications   Medication Sig Start Date End Date Taking? Authorizing Provider  metoprolol (LOPRESSOR) 50 MG tablet Take 1 tablet (50 mg total) by mouth 2 (two) times daily. 12/05/15  Yes Dionne Bucy McClung, PA-C  ondansetron (ZOFRAN) 4 MG tablet Take 1 tablet (4 mg total) by mouth every 8 (eight) hours as needed for nausea or vomiting. 11/29/15  Yes Courteney Lyn Mackuen, MD  ibuprofen (ADVIL,MOTRIN) 800 MG tablet Take 1 tablet (800 mg total) by mouth 3 (three) times daily. Patient not taking: Reported on 12/05/2015 10/19/15   Leo Grosser, MD     Objective:  EXAM:   Vitals:   12/05/15 1102  BP: (!) 169/94  Pulse: 72  Resp: 16  Temp: 98.4 F (36.9 C)  TempSrc: Oral  SpO2: 98%  Weight: (!) 328 lb (148.8 kg)    General appearance : A&OX3. NAD. Non-toxic-appearing HEENT: Atraumatic and Normocephalic.  PERRLA. EOM intact.  TM clear B. Mouth-MMM, post pharynx WNL w/o erythema, No PND. Neck: supple, no JVD. No cervical lymphadenopathy. No thyromegaly Chest/Lungs:  Breathing-non-labored, Good air entry bilaterally, breath sounds normal without rales, rhonchi, or wheezing  CVS: S1 S2 regular, no murmurs, gallops, rubs  Extremities: Bilateral Lower Ext shows no edema, both legs are warm to touch with = pulse throughout Neurology:  CN II-XII grossly intact, Non focal.   Psych:  TP linear.  J/I WNL. Normal speech. Appropriate eye contact and affect.  Skin:  No Rash  Data Review Lab Results  Component Value Date   HGBA1C 5.8 12/05/2015     Assessment & Plan   1. Hyperglycemia likely with insulin resistance  I have had a lengthy discussion and provided education about insulin resistance and the intake of too much sugar/refined carbohydrates.  I have advised the patient to work at a goal of eliminating sugary drinks, candy, desserts,  sweets, refined sugars, processed foods, and white carbohydrates.  The patient expresses understanding.  Suggested [place] as an eating plan.  She is at high risk for developing diabetes. Also encouraged setting small, sustainable exercise goals and increasing water intake.   - POCT glycosylated hemoglobin (Hb A1C) - Glucose (CBG)  2. Essential hypertension We have discussed target BP range and blood pressure goal. I have advised patient to check BP regularly and to call us back or report to clinic if the numbers are consistently higher than 140/90. We discussed the importance of compliance with medical therapy and DASH diet recommended, consequences of uncontrolled hypertension discussed. If her BP is not within normal range within 2- 3 weeks, she will make an appointment for follow-up sooner than 2 months from now Restart- metoprolol (LOPRESSOR) 50 MG tablet; Take 1 tablet (50 mg total) by mouth 2 (two) times daily.  Dispense: 60 tablet; Refill: 5  3. Dyspepsia - H. pylori breath test  Patient have been counseled extensively about nutrition and exercise  Return in about 2 months (around 02/04/2016) for f/up with Dr. Jarold Song on hyperglycemia; recheck A1C/CBG and htn f/up.  The patient was given clear instructions to go to ER or return to medical center if symptoms don't improve, worsen or new problems develop. The patient verbalized understanding. The patient was told to call to get lab results if they haven't heard anything in the next week.     [name], PA-C [facility] [place], [place]   12/05/2015, 2:16 PMPatient ID: Mary Mathis, female   DOB: May 16, 1975, 40 y.o.   MRN: [id number]

## 2015-12-05 NOTE — Progress Notes (Signed)
Pt is in the office today for ED follow up for hypertension and dizziness Pt states she is not in any pain

## 2015-12-06 ENCOUNTER — Telehealth: Payer: Self-pay

## 2015-12-06 LAB — H. PYLORI BREATH TEST: H. PYLORI BREATH TEST: NOT DETECTED

## 2015-12-06 NOTE — Telephone Encounter (Signed)
Contacted pt to go over lab results pt is aware of results and doesn't have any questions or concerns 

## 2015-12-14 ENCOUNTER — Emergency Department (HOSPITAL_COMMUNITY)
Admission: EM | Admit: 2015-12-14 | Discharge: 2015-12-14 | Disposition: A | Discharge status: Home / Self Care | Payer: Self-pay | Attending: Emergency Medicine | Admitting: Emergency Medicine

## 2015-12-14 ENCOUNTER — Encounter (HOSPITAL_COMMUNITY): Payer: Self-pay | Admitting: *Deleted

## 2015-12-14 ENCOUNTER — Emergency Department (HOSPITAL_COMMUNITY): Payer: Self-pay

## 2015-12-14 DIAGNOSIS — R42 Dizziness and giddiness: Secondary | ICD-10-CM

## 2015-12-14 DIAGNOSIS — I10 Essential (primary) hypertension: Secondary | ICD-10-CM | POA: Insufficient documentation

## 2015-12-14 LAB — CBG MONITORING, ED: GLUCOSE-CAPILLARY: 103 mg/dL — AB (ref 65–99)

## 2015-12-14 LAB — URINALYSIS, ROUTINE W REFLEX MICROSCOPIC
Bilirubin Urine: NEGATIVE
Glucose, UA: NEGATIVE mg/dL
Hgb urine dipstick: NEGATIVE
Ketones, ur: NEGATIVE mg/dL
Leukocytes, UA: NEGATIVE
Nitrite: NEGATIVE
Protein, ur: NEGATIVE mg/dL
Specific Gravity, Urine: 1.017 (ref 1.005–1.030)
pH: 6 (ref 5.0–8.0)

## 2015-12-14 LAB — I-STAT TROPONIN, ED: TROPONIN I, POC: 0.01 ng/mL (ref 0.00–0.08)

## 2015-12-14 LAB — BASIC METABOLIC PANEL
ANION GAP: 5 (ref 5–15)
BUN: 8 mg/dL (ref 6–20)
CALCIUM: 9 mg/dL (ref 8.9–10.3)
CO2: 27 mmol/L (ref 22–32)
Chloride: 107 mmol/L (ref 101–111)
Creatinine, Ser: 0.82 mg/dL (ref 0.44–1.00)
GLUCOSE: 113 mg/dL — AB (ref 65–99)
Potassium: 3.7 mmol/L (ref 3.5–5.1)
SODIUM: 139 mmol/L (ref 135–145)

## 2015-12-14 LAB — CBC
HCT: 36.3 % (ref 36.0–46.0)
Hemoglobin: 11.2 g/dL — ABNORMAL LOW (ref 12.0–15.0)
MCH: 25.9 pg — ABNORMAL LOW (ref 26.0–34.0)
MCHC: 30.9 g/dL (ref 30.0–36.0)
MCV: 83.8 fL (ref 78.0–100.0)
PLATELETS: 290 10*3/uL (ref 150–400)
RBC: 4.33 MIL/uL (ref 3.87–5.11)
RDW: 13.9 % (ref 11.5–15.5)
WBC: 8 10*3/uL (ref 4.0–10.5)

## 2015-12-14 LAB — PREGNANCY, URINE: Preg Test, Ur: NEGATIVE

## 2015-12-14 MED ORDER — MECLIZINE HCL 25 MG PO TABS
25.0000 mg | ORAL_TABLET | Freq: Once | ORAL | Status: AC
  Administered 2015-12-14: 25 mg via ORAL
  Filled 2015-12-14: qty 1

## 2015-12-14 MED ORDER — MECLIZINE HCL 12.5 MG PO TABS: 12.5000 mg | ORAL_TABLET | Freq: Three times a day (TID) | ORAL | 0 refills | Status: DC | PRN

## 2015-12-14 NOTE — ED Triage Notes (Addendum)
Patient presents to ED with mother with c/o dizziness that began while she was shopping in a store around 15:00 today.  The dizziness resolved after about 20 minutes.  Patient had an episode of dizziness last night as well.  She states the dizziness woke her from her sleep around 2:30 this morning, but got worse when she ambulated to the bathroom.  Patient states she "kind of sort of" has had nausea.  Patient denies chest pain and vomiting.  Patient has been evaluated here for the same symptoms 3 times since August of this year.  Patient was started on Metoprolol in ED for HTN in September and has been taking it ever since.  She takes that medication twice a day.    Neuro exam grossly normal.  Grips equal bilat, light touch sensation equal bilat, no arm drift, tongue midline, face/smile symmetrical, no dysmetria noted.  EOMI, PERRLA 33mm, no nystagmus.    Patient has seen a doctor at [facility] regarding these symptoms last week and the provider told her to eat well and exercise.

## 2015-12-14 NOTE — ED Provider Notes (Signed)
[facility] DEPT Provider Note   CSN: [id number] Arrival date & time: 12/14/15  1526     History   Chief Complaint Chief Complaint  Patient presents with  . Dizziness    HPI Mary Mathis is a 40 y.o. female who presents to the ED today c/o ongoing dizziness and weakness. Pt reports that she has been experiencing intermittent dizziness and weakness 2-3 x per week for the last month. This is her 4th visit to the ED for same. Pt states that she was walking in a store earlier today when she had sudden onset dizziness and sensation of feeling like the room is spinning. She denies any HA, vomiting, paresthesias, chest pain. Pt states that she was seen by her PCP 2 days ago and was told that she was "borderline diabetic" and her dizziness was thought to be due to her sugars being too low or too high so pt tried to eat something sweet to improve her symptoms. Pt states that shehad a few spoonfulls of honey today which improved her dizziness.  Of note pt has had normal lab work, CXR and CT angio in the ED in the last month. No imaging of her head has been performed.  HPI  Past Medical History:  Diagnosis Date  . Cholecystitis   . Hypertension     Patient Active Problem List   Diagnosis Date Noted  . Hypokalemia 07/20/2014  . Essential hypertension 07/06/2014  . Obesity 07/06/2014  . Pedal edema 07/06/2014  . Symptomatic cholelithiasis 04/14/2011    Past Surgical History:  Procedure Laterality Date  . CHOLECYSTECTOMY  05/04/2011   Procedure: LAPAROSCOPIC CHOLECYSTECTOMY;  Surgeon: Harl Bowie, MD;  Location: [facility];  Service: General;  Laterality: N/A;  . INTRAOPERATIVE CHOLANGIOGRAM  05/04/2011   Procedure: INTRAOPERATIVE CHOLANGIOGRAM;  Surgeon: Harl Bowie, MD;  Location: [facility];  Service: General;  Laterality: N/A;    OB History    No data available       Home Medications    Prior to Admission medications   Medication Sig Start Date End Date Taking?  Authorizing Provider  metoprolol (LOPRESSOR) 50 MG tablet Take 1 tablet (50 mg total) by mouth 2 (two) times daily. 12/05/15  Yes Dionne Bucy McClung, PA-C  ibuprofen (ADVIL,MOTRIN) 800 MG tablet Take 1 tablet (800 mg total) by mouth 3 (three) times daily. Patient not taking: Reported on 12/14/2015 10/19/15   Leo Grosser, MD  ondansetron (ZOFRAN) 4 MG tablet Take 1 tablet (4 mg total) by mouth every 8 (eight) hours as needed for nausea or vomiting. Patient not taking: Reported on 12/14/2015 11/29/15   Courteney Lyn Mackuen, MD    Family History Family History  Problem Relation Age of Onset  . Hypertension Mother   . Diabetes Father   . Hypertension Father     Social History Social History  Substance Use Topics  . Smoking status: Never Smoker  . Smokeless tobacco: Never Used  . Alcohol use No     Allergies   Amoxicillin   Review of Systems Review of Systems  All other systems reviewed and are negative.    Physical Exam Updated Vital Signs BP 157/91   Pulse 70   Temp 98.2 F (36.8 C) (Oral)   Resp 20   Ht 5\' 9"  (1.753 m)   Wt (!) 144.2 kg   LMP 12/12/2015 (Exact Date)   SpO2 100%   BMI 46.96 kg/m   Physical Exam  Constitutional: She is oriented to person, place,  and time. She appears well-developed and well-nourished. No distress.  HENT:  Head: Normocephalic and atraumatic.  Mouth/Throat: No oropharyngeal exudate.  Eyes: Conjunctivae and EOM are normal. Pupils are equal, round, and reactive to light. Right eye exhibits no discharge. Left eye exhibits no discharge. No scleral icterus.  Cardiovascular: Normal rate, regular rhythm, normal heart sounds and intact distal pulses.  Exam reveals no gallop and no friction rub.   No murmur heard. Pulmonary/Chest: Effort normal and breath sounds normal. No respiratory distress. She has no wheezes. She has no rales. She exhibits no tenderness.  Abdominal: Soft. She exhibits no distension. There is no tenderness. There is no  guarding.  Musculoskeletal: Normal range of motion. She exhibits no edema.  Neurological: She is alert and oriented to person, place, and time. No cranial nerve deficit.  Strength 5/5 throughout. No sensory deficits. No gait abnormality. No dysmetria. No slurred speech. No facial droop. Negative pronator drift.    Skin: Skin is warm and dry. No rash noted. She is not diaphoretic. No erythema. No pallor.  Psychiatric: She has a normal mood and affect. Her behavior is normal.  Nursing note and vitals reviewed.    ED Treatments / Results  Labs (all labs ordered are listed, but only abnormal results are displayed) Labs Reviewed  CBC - Abnormal; Notable for the following:       Result Value   Hemoglobin 11.2 (*)    MCH 25.9 (*)    All other components within normal limits  BASIC METABOLIC PANEL - Abnormal; Notable for the following:    Glucose, Bld 113 (*)    All other components within normal limits  CBG MONITORING, ED - Abnormal; Notable for the following:    Glucose-Capillary 103 (*)    All other components within normal limits  URINALYSIS, ROUTINE W REFLEX MICROSCOPIC (NOT AT [facility])  PREGNANCY, URINE  I-STAT TROPOININ, ED    EKG  EKG Interpretation  Date/Time:  Saturday December 14 2015 16:46:09 EDT Ventricular Rate:  71 PR Interval:    QRS Duration: 97 QT Interval:  402 QTC Calculation: 437 R Axis:   52 Text Interpretation:  Sinus rhythm Probable left atrial enlargement since last tracing no significant change Confirmed by Eulis Foster  MD, ELLIOTT [id number]) on 12/14/2015 5:46:06 PM       Radiology No results found.  Procedures Procedures (including critical care time)  Medications Ordered in ED Medications  meclizine (ANTIVERT) tablet 25 mg (not administered)     Initial Impression / Assessment and Plan / ED Course  I have reviewed the triage vital signs and the nursing notes.  Pertinent labs & imaging results that were available during my care of the patient were  reviewed by me and considered in my medical decision making (see chart for details).  Clinical Course    40 y.o F with a pmhx of HTn presents to the ED today c.o intermittent dizziness and weakness. Pt currently asymptomatic in the ED. Dizziness is typically worse upon standing. This is pts 4th visit to the ED with similar complaints. She has had normal lab work, CT angio at previous ED visits. Pt was also seen by her PCP last week and dizziness was thought to be related to abnormal blood glucose. Glucose today is 113. BP 157/86. No orthostasis. CT head obtained today which is unremarkable. EKG without changes. Troponin and pregnancy negative. Pt has remained asymptomatic while in the ED. Feel that she is safe for discharge with PCP follow up. Also recommend  neuro follow up. Referral given. Will try meclizine to see if this alleviates symptoms.  Case discussed with Dr. Gilford Raid who agrees with treatment plan.  Final Clinical Impressions(s) / ED Diagnoses   Final diagnoses:  Dizziness    New Prescriptions Discharge Medication List as of 12/14/2015 10:38 PM    START taking these medications   Details  meclizine (ANTIVERT) 12.5 MG tablet Take 1 tablet (12.5 mg total) by mouth 3 (three) times daily as needed for dizziness., Starting Sat 12/14/2015, Print         Dondra Spry [place], PA-C 12/15/15 2355    Isla Pence, MD 12/16/15 [phone]

## 2015-12-14 NOTE — ED Notes (Signed)
Pt is unable to urinate at this time. 

## 2015-12-14 NOTE — Discharge Instructions (Signed)
Your CT scan today was completely normal. No sign of intracranial mass. Take meclizine as needed for dizziness. Please check your blood sugars at home especially when you are experiencing symptoms. Follow up with your PCP and with neurology if your symptoms do not improve. Return to the ED if you experience severe worsening of your symptoms, loss of consciousness, headache, chest pain, blurry vision.

## 2015-12-25 ENCOUNTER — Ambulatory Visit: Payer: Self-pay | Attending: Internal Medicine | Admitting: Physician Assistant

## 2015-12-25 VITALS — BP 130/77 | HR 90 | Temp 97.8°F | Ht 69.0 in | Wt 328.0 lb

## 2015-12-25 DIAGNOSIS — Z79899 Other long term (current) drug therapy: Secondary | ICD-10-CM | POA: Insufficient documentation

## 2015-12-25 DIAGNOSIS — R42 Dizziness and giddiness: Secondary | ICD-10-CM

## 2015-12-25 DIAGNOSIS — R109 Unspecified abdominal pain: Secondary | ICD-10-CM | POA: Insufficient documentation

## 2015-12-25 DIAGNOSIS — I10 Essential (primary) hypertension: Secondary | ICD-10-CM

## 2015-12-25 DIAGNOSIS — R739 Hyperglycemia, unspecified: Secondary | ICD-10-CM | POA: Insufficient documentation

## 2015-12-25 MED ORDER — MECLIZINE HCL 12.5 MG PO TABS: 12.5000 mg | ORAL_TABLET | Freq: Three times a day (TID) | ORAL | 3 refills | Status: DC | PRN

## 2015-12-25 MED ORDER — METOPROLOL TARTRATE 50 MG PO TABS: 50.0000 mg | ORAL_TABLET | Freq: Two times a day (BID) | ORAL | 5 refills | Status: DC

## 2015-12-25 MED FILL — ?METOPROLOL 50 MG TABLET: 50 | 15 days supply | Qty: 60 | Fill #0

## 2015-12-25 NOTE — Progress Notes (Signed)
Pt needs refill on BP medication.

## 2015-12-25 NOTE — Progress Notes (Signed)
Chief Complaint: follow up hospital  Subjective: This is a pleasant 40 year old female who presented to the ED on 12/14/2015 for the fourth time with similar complaints. She is a pt of Dr. Jarold Song. She describes episodes where she'll get some left-sided abdominal pain and nausea followed by dizziness. She describes the room spinning at times. Over the last 6 weeks she has had CT scans of her head, CT angio of her chest and labs. Essentially they have been non revealing with the exception of hyperglycemia, but her A1C is normal.   She was given a prn script for Meclizine. She has had one additional "dizzy spell". She took the Meclizine with improvement of sxs. Overall she feels much better. She is exercising some. Weight down ~10 pounds. No new complaints today.   ROS:  GEN: denies fever or chills, denies change in weight Skin: denies lesions or rashes HEENT: denies headache, earache, epistaxis, sore throat, or neck pain LUNGS: denies SHOB, dyspnea, PND, orthopnea CV: denies CP or palpitations ABD: denies abd pain, N or V EXT: denies muscle spasms or swelling; no pain in lower ext, no weakness NEURO: denies numbness or tingling, denies sz, stroke or TIA   Objective:  Vitals:   12/25/15 1011 12/25/15 1029  BP: (!) 155/106 130/77  Pulse: 90   Temp: 97.8 F (36.6 C)   TempSrc: Oral   SpO2: 97%   Weight: (!) 328 lb (148.8 kg)   Height: 5\' 9"  (1.753 m)     Physical Exam:  General: in no acute distress. HEENT: no pallor, no icterus, moist oral mucosa, no JVD, no lymphadenopathy, no fluid, right ear a little red Heart: Normal  s1 &s2  Regular rate and rhythm, without murmurs, rubs, gallops. Lungs: Clear to auscultation bilaterally. Abdomen: Soft, nontender, nondistended, positive bowel sounds. Extremities: No clubbing cyanosis or edema with positive pedal pulses. Neuro: Alert, awake, oriented x3, nonfocal.   Medications: Prior to Admission medications   Medication Sig Start Date  End Date Taking? Authorizing Provider  meclizine (ANTIVERT) 12.5 MG tablet Take 1 tablet (12.5 mg total) by mouth 3 (three) times daily as needed for dizziness. 12/25/15  Yes Montie Gelardi Daneil Dan, PA-C  metoprolol (LOPRESSOR) 50 MG tablet Take 1 tablet (50 mg total) by mouth 2 (two) times daily. 12/25/15  Yes Daylan Boggess Daneil Dan, PA-C  ibuprofen (ADVIL,MOTRIN) 800 MG tablet Take 1 tablet (800 mg total) by mouth 3 (three) times daily. Patient not taking: Reported on 12/25/2015 10/19/15   Leo Grosser, MD  ondansetron (ZOFRAN) 4 MG tablet Take 1 tablet (4 mg total) by mouth every 8 (eight) hours as needed for nausea or vomiting. Patient not taking: Reported on 12/25/2015 11/29/15   Courteney Lyn Mackuen, MD    Assessment: 1. Dizziness, I believe she has Vertigo 2. Hypertension 3. Hyperglycemia  Plan: Cont meclizine prn Refill antihypertensive Low CARB and low salt diet  Increase exercise RF modification  Follow up: 2 weeks for routine health maintenaance  The patient was given clear instructions to go to ER or return to medical center if symptoms don't improve, worsen or new problems develop. The patient verbalized understanding. The patient was told to call to get lab results if they haven't heard anything in the next week.   This note has been created with Surveyor, quantity. Any transcriptional errors are unintentional.   Zettie Pho, PA-C 12/25/2015, 10:33 AM

## 2015-12-27 ENCOUNTER — Ambulatory Visit: Payer: Self-pay | Admitting: Diagnostic Neuroimaging

## 2015-12-31 ENCOUNTER — Encounter (HOSPITAL_COMMUNITY): Payer: Self-pay | Admitting: Emergency Medicine

## 2015-12-31 ENCOUNTER — Emergency Department (HOSPITAL_COMMUNITY)
Admission: EM | Admit: 2015-12-31 | Discharge: 2016-01-01 | Disposition: A | Discharge status: Home / Self Care | Payer: Self-pay | Attending: Emergency Medicine | Admitting: Emergency Medicine

## 2015-12-31 DIAGNOSIS — R109 Unspecified abdominal pain: Secondary | ICD-10-CM | POA: Insufficient documentation

## 2015-12-31 DIAGNOSIS — Z79899 Other long term (current) drug therapy: Secondary | ICD-10-CM | POA: Insufficient documentation

## 2015-12-31 DIAGNOSIS — I10 Essential (primary) hypertension: Secondary | ICD-10-CM | POA: Insufficient documentation

## 2015-12-31 LAB — URINE MICROSCOPIC-ADD ON

## 2015-12-31 LAB — URINALYSIS, ROUTINE W REFLEX MICROSCOPIC
BILIRUBIN URINE: NEGATIVE
Glucose, UA: NEGATIVE mg/dL
KETONES UR: NEGATIVE mg/dL
NITRITE: NEGATIVE
PROTEIN: NEGATIVE mg/dL
SPECIFIC GRAVITY, URINE: 1.011 (ref 1.005–1.030)
pH: 7 (ref 5.0–8.0)

## 2015-12-31 LAB — PREGNANCY, URINE: PREG TEST UR: NEGATIVE

## 2015-12-31 MED ORDER — KETOROLAC TROMETHAMINE 60 MG/2ML IM SOLN
60.0000 mg | Freq: Once | INTRAMUSCULAR | Status: AC
  Administered 2016-01-01: 60 mg via INTRAMUSCULAR
  Filled 2015-12-31: qty 2

## 2015-12-31 NOTE — Discharge Instructions (Signed)
Please follow up with OBGYN on this matter. You may go to your own provider or go to the [facility]. It is also recommended that you select and follow up with a primary care provider.

## 2015-12-31 NOTE — ED Notes (Signed)
Pt states period started today and is having left lower quadrant pain that radiates into groin. Denies n/v/d.

## 2015-12-31 NOTE — ED Provider Notes (Signed)
[facility] DEPT Provider Note   CSN: [id number] Arrival date & time: 12/31/15  2135     History   Chief Complaint Chief Complaint  Patient presents with  . Vaginal Pain    HPI Mary Mathis is a 40 y.o. female.  HPI   Mary Mathis is a 40 y.o. female, with a history of HTN, presenting to the ED with recurrent left flank pain beginning this morning. Pain came along with the start of her menstrual cycle. Pain is a pressure, mild to moderate, radiating toward the vagina. Pt states she has had these symptoms before with her menstrual cycle. They subside when her menstrual cycle subsides. Patient has not tried any medication for this issue. She also has not addressed this issue with OB/GYN. Patient denies urinary complaints, nausea/vomiting, fever/chills, or any other complaints.   Past Medical History:  Diagnosis Date  . Cholecystitis   . Hypertension     Patient Active Problem List   Diagnosis Date Noted  . Hypokalemia 07/20/2014  . Essential hypertension 07/06/2014  . Obesity 07/06/2014  . Pedal edema 07/06/2014  . Symptomatic cholelithiasis 04/14/2011    Past Surgical History:  Procedure Laterality Date  . CHOLECYSTECTOMY  05/04/2011   Procedure: LAPAROSCOPIC CHOLECYSTECTOMY;  Surgeon: Harl Bowie, MD;  Location: [facility];  Service: General;  Laterality: N/A;  . INTRAOPERATIVE CHOLANGIOGRAM  05/04/2011   Procedure: INTRAOPERATIVE CHOLANGIOGRAM;  Surgeon: Harl Bowie, MD;  Location: [facility];  Service: General;  Laterality: N/A;    OB History    No data available       Home Medications    Prior to Admission medications   Medication Sig Start Date End Date Taking? Authorizing Provider  meclizine (ANTIVERT) 12.5 MG tablet Take 1 tablet (12.5 mg total) by mouth 3 (three) times daily as needed for dizziness. 12/25/15  Yes Tiffany Daneil Dan, PA-C  metoprolol (LOPRESSOR) 50 MG tablet Take 1 tablet (50 mg total) by mouth 2 (two) times daily. 12/25/15   Yes Tiffany Daneil Dan, PA-C  Multiple Vitamin (MULTIVITAMIN WITH MINERALS) TABS tablet Take 1 tablet by mouth daily.   Yes Historical Provider, MD  ibuprofen (ADVIL,MOTRIN) 800 MG tablet Take 1 tablet (800 mg total) by mouth 3 (three) times daily. Patient not taking: Reported on 12/25/2015 10/19/15   Leo Grosser, MD  ondansetron (ZOFRAN) 4 MG tablet Take 1 tablet (4 mg total) by mouth every 8 (eight) hours as needed for nausea or vomiting. Patient not taking: Reported on 12/25/2015 11/29/15   Courteney Lyn Mackuen, MD    Family History Family History  Problem Relation Age of Onset  . Hypertension Mother   . Diabetes Father   . Hypertension Father     Social History Social History  Substance Use Topics  . Smoking status: Never Smoker  . Smokeless tobacco: Never Used  . Alcohol use No     Allergies   Amoxicillin   Review of Systems Review of Systems  Constitutional: Negative for chills and fever.  Gastrointestinal: Negative for constipation, diarrhea, nausea and vomiting.  Genitourinary: Positive for flank pain. Negative for vaginal discharge.  All other systems reviewed and are negative.    Physical Exam Updated Vital Signs BP 171/98 (BP Location: Left Arm)   Pulse 75   Temp 99 F (37.2 C) (Oral)   Resp 17   Ht 5\' 9"  (1.753 m)   Wt (!) 147.9 kg   LMP 12/31/2015   SpO2 100%   BMI 48.16 kg/m  Physical Exam  Constitutional: She appears well-developed and well-nourished. No distress.  HENT:  Head: Normocephalic and atraumatic.  Eyes: Conjunctivae are normal.  Neck: Neck supple.  Cardiovascular: Normal rate, regular rhythm, normal heart sounds and intact distal pulses.   Pulmonary/Chest: Effort normal and breath sounds normal. No respiratory distress.  Abdominal: Soft. There is no tenderness. There is no guarding.  Musculoskeletal: She exhibits no edema.  Lymphadenopathy:    She has no cervical adenopathy.  Neurological: She is alert.  Skin: Skin is warm and  dry. She is not diaphoretic.  Psychiatric: She has a normal mood and affect. Her behavior is normal.  Nursing note and vitals reviewed.    ED Treatments / Results  Labs (all labs ordered are listed, but only abnormal results are displayed) Labs Reviewed  URINALYSIS, ROUTINE W REFLEX MICROSCOPIC (NOT AT [facility]) - Abnormal; Notable for the following:       Result Value   Hgb urine dipstick LARGE (*)    Leukocytes, UA TRACE (*)    All other components within normal limits  URINE MICROSCOPIC-ADD ON - Abnormal; Notable for the following:    Squamous Epithelial / LPF 0-5 (*)    Bacteria, UA RARE (*)    All other components within normal limits  URINE CULTURE  PREGNANCY, URINE    EKG  EKG Interpretation None       Radiology No results found.  Procedures Procedures (including critical care time)  Medications Ordered in ED Medications  ketorolac (TORADOL) injection 60 mg (60 mg Intramuscular Given 01/01/16 0007)     Initial Impression / Assessment and Plan / ED Course  I have reviewed the triage vital signs and the nursing notes.  Pertinent labs & imaging results that were available during my care of the patient were reviewed by me and considered in my medical decision making (see chart for details).  Clinical Course    Patient presents with recurrent left flank pain during her menstrual cycles. No UTI on UA. No point tenderness on exam. Patient may likely need further workup. She states that she would prefer to do this with OB/GYN. Patient was encouraged to follow through with this course of action. Patient stated during the interview that she felt much better just being listened to. The patient was given instructions for home care as well as return precautions. Patient voices understanding of these instructions, accepts the plan, and is comfortable with discharge.  Vitals:   12/31/15 2152 12/31/15 2154 01/01/16 0011  BP:  171/98 135/89  Pulse:  75 79  Resp:  17 22    Temp:  99 F (37.2 C) 98.1 F (36.7 C)  TempSrc:  Oral Oral  SpO2:  100% 98%  Weight: (!) 147.9 kg    Height: 5\' 9"  (1.753 m)       Final Clinical Impressions(s) / ED Diagnoses   Final diagnoses:  Flank pain    New Prescriptions Discharge Medication List as of 12/31/2015 11:54 PM       Lorayne Bender, PA-C 01/01/16 0047    Leo Grosser, MD 01/01/16 [phone]

## 2015-12-31 NOTE — ED Triage Notes (Signed)
Pt states this is her 5 visit and [facility]. Pt states today she has this pain to her lower left side that radiates to her vagina. Pt states she only has this when she is on her period and it started today. States they are not cramps.

## 2016-01-02 LAB — URINE CULTURE

## 2016-01-10 ENCOUNTER — Encounter: Payer: Self-pay | Admitting: Family Medicine

## 2016-01-10 ENCOUNTER — Ambulatory Visit: Payer: Self-pay | Attending: Family Medicine | Admitting: Family Medicine

## 2016-01-10 VITALS — BP 160/110 | HR 76 | Temp 98.5°F | Ht 69.0 in | Wt 330.4 lb

## 2016-01-10 DIAGNOSIS — I10 Essential (primary) hypertension: Secondary | ICD-10-CM

## 2016-01-10 DIAGNOSIS — F419 Anxiety disorder, unspecified: Secondary | ICD-10-CM

## 2016-01-10 DIAGNOSIS — Z79899 Other long term (current) drug therapy: Secondary | ICD-10-CM | POA: Insufficient documentation

## 2016-01-10 DIAGNOSIS — H8113 Benign paroxysmal vertigo, bilateral: Secondary | ICD-10-CM

## 2016-01-10 DIAGNOSIS — H8110 Benign paroxysmal vertigo, unspecified ear: Secondary | ICD-10-CM | POA: Insufficient documentation

## 2016-01-10 DIAGNOSIS — N920 Excessive and frequent menstruation with regular cycle: Secondary | ICD-10-CM

## 2016-01-10 DIAGNOSIS — Z9049 Acquired absence of other specified parts of digestive tract: Secondary | ICD-10-CM | POA: Insufficient documentation

## 2016-01-10 MED ORDER — BUSPIRONE HCL 7.5 MG PO TABS: 7.5000 mg | ORAL_TABLET | Freq: Two times a day (BID) | ORAL | 0 refills | Status: DC

## 2016-01-10 MED ORDER — LISINOPRIL 5 MG PO TABS: 5.0000 mg | ORAL_TABLET | Freq: Every day | ORAL | 3 refills | Status: DC

## 2016-01-10 MED FILL — busPIRone HCL 7.5 MG TABS: 7.5 | 30 days supply | Qty: 60 | Fill #0

## 2016-01-10 MED FILL — LISINOPRIL 5 MG TABLET: 5 | 30 days supply | Qty: 30 | Fill #0

## 2016-01-10 NOTE — Progress Notes (Signed)
Subjective:  Patient ID: Mary Mathis, female    DOB: 09-04-1975  Age: 40 y.o. MRN: [id number]  CC: Hypertension   HPI Mary Mathis presents a 40 year old female with a history of morbid obesity, hypertension who comes in for follow-up visit today. She has had recent visits for vertigo and informs me that symptoms are controlled at this time and she rarely has to use meclizine.  Her most recent ED visit was for left flank pain which occurs with her periods and vaginal pressure and symptoms still persist. She informs me vaginal pressure occurs during urination but not while she is bearing down while having a  bowel movement and not when she coughs. Denies urinary incontinence.  Also complains of a sensation of "warmth going through her body" which is unrelated to anything and occurs spontaneously and when she is driving. She wonders if this has anything to do with anxiety.  Past Medical History:  Diagnosis Date  . Cholecystitis   . Hypertension     Past Surgical History:  Procedure Laterality Date  . CHOLECYSTECTOMY  05/04/2011   Procedure: LAPAROSCOPIC CHOLECYSTECTOMY;  Surgeon: Harl Bowie, MD;  Location: [facility];  Service: General;  Laterality: N/A;  . INTRAOPERATIVE CHOLANGIOGRAM  05/04/2011   Procedure: INTRAOPERATIVE CHOLANGIOGRAM;  Surgeon: Harl Bowie, MD;  Location: [facility];  Service: General;  Laterality: N/A;     Outpatient Medications Prior to Visit  Medication Sig Dispense Refill  . ibuprofen (ADVIL,MOTRIN) 800 MG tablet Take 1 tablet (800 mg total) by mouth 3 (three) times daily. 21 tablet 0  . meclizine (ANTIVERT) 12.5 MG tablet Take 1 tablet (12.5 mg total) by mouth 3 (three) times daily as needed for dizziness. 30 tablet 3  . metoprolol (LOPRESSOR) 50 MG tablet Take 1 tablet (50 mg total) by mouth 2 (two) times daily. 60 tablet 5  . Multiple Vitamin (MULTIVITAMIN WITH MINERALS) TABS tablet Take 1 tablet by mouth daily.    . ondansetron (ZOFRAN)  4 MG tablet Take 1 tablet (4 mg total) by mouth every 8 (eight) hours as needed for nausea or vomiting. (Patient not taking: Reported on 01/10/2016) 11 tablet 0   No facility-administered medications prior to visit.     ROS Review of Systems  Constitutional: Negative for activity change, appetite change and fatigue.  HENT: Negative for congestion, sinus pressure and sore throat.   Eyes: Negative for visual disturbance.  Respiratory: Negative for cough, chest tightness, shortness of breath and wheezing.   Cardiovascular: Negative for chest pain and palpitations.  Gastrointestinal: Negative for abdominal distention, abdominal pain and constipation.  Endocrine: Negative for polydipsia.  Genitourinary: Positive for menstrual problem. Negative for dysuria and frequency.  Musculoskeletal: Negative for arthralgias and back pain.  Skin: Negative for rash.  Neurological: Negative for tremors, light-headedness and numbness.  Hematological: Does not bruise/bleed easily.  Psychiatric/Behavioral: Negative for agitation and behavioral problems.    Objective:  BP (!) 160/110 (BP Location: Right Arm, Patient Position: Sitting, Cuff Size: Large)   Pulse 76   Temp 98.5 F (36.9 C) (Oral)   Ht 5\' 9"  (1.753 m)   Wt (!) 330 lb 6.4 oz (149.9 kg)   LMP 12/31/2015   SpO2 99%   BMI 48.79 kg/m   BP/Weight 01/10/2016 01/01/2016 [id number]  Systolic BP 0000000 [id number] -  Diastolic BP A999333 89 -  Wt. (Lbs) 330.4 - 326.1  BMI 48.79 - 48.16      Physical Exam  Constitutional: She is oriented to person,  place, and time. She appears well-developed and well-nourished.  Morbidly obese  Cardiovascular: Normal rate, normal heart sounds and intact distal pulses.   No murmur heard. Pulmonary/Chest: Effort normal and breath sounds normal. She has no wheezes. She has no rales. She exhibits no tenderness.  Abdominal: Soft. Bowel sounds are normal. She exhibits no distension and no mass. There is no tenderness.    Musculoskeletal: Normal range of motion.  Neurological: She is alert and oriented to person, place, and time.     Assessment & Plan:   1. Anxiety TSH normal We'll place on BuSpar for presumptive anxiety If symptoms persist despite on BuSpar and I will discontinue this. - busPIRone (BUSPAR) 7.5 MG tablet; Take 1 tablet (7.5 mg total) by mouth 2 (two) times daily.  Dispense: 60 tablet; Refill: 0  2. Essential hypertension Continue metoprolol Low-sodium diet, lifestyle modification and weight loss - lisinopril (PRINIVIL,ZESTRIL) 5 MG tablet; Take 1 tablet (5 mg total) by mouth daily.  Dispense: 30 tablet; Refill: 3  3. Menorrhagia with regular cycle Will need pelvic ultrasound to exclude fibroids especially in the setting of pressure in the vagina. She has been scheduled for Pap smear at next visit as she has to return to work and is unable to wait.  4. Benign paroxysmal positional vertigo due to bilateral vestibular disorder Controlled She has really had to use meclizine.   Meds ordered this encounter  Medications  . lisinopril (PRINIVIL,ZESTRIL) 5 MG tablet    Sig: Take 1 tablet (5 mg total) by mouth daily.    Dispense:  30 tablet    Refill:  3  . busPIRone (BUSPAR) 7.5 MG tablet    Sig: Take 1 tablet (7.5 mg total) by mouth 2 (two) times daily.    Dispense:  60 tablet    Refill:  0    Follow-up: Return in about 2 weeks (around 01/24/2016) for complete physical exam.   Arnoldo Morale MD

## 2016-01-20 ENCOUNTER — Encounter: Payer: Self-pay | Admitting: Obstetrics and Gynecology

## 2016-01-20 ENCOUNTER — Ambulatory Visit (INDEPENDENT_AMBULATORY_CARE_PROVIDER_SITE_OTHER): Payer: Self-pay | Admitting: Obstetrics and Gynecology

## 2016-01-20 VITALS — BP 147/80 | HR 72 | Ht 69.0 in | Wt 328.9 lb

## 2016-01-20 DIAGNOSIS — Z1151 Encounter for screening for human papillomavirus (HPV): Secondary | ICD-10-CM

## 2016-01-20 DIAGNOSIS — N920 Excessive and frequent menstruation with regular cycle: Secondary | ICD-10-CM

## 2016-01-20 DIAGNOSIS — Z113 Encounter for screening for infections with a predominantly sexual mode of transmission: Secondary | ICD-10-CM

## 2016-01-20 DIAGNOSIS — Z124 Encounter for screening for malignant neoplasm of cervix: Secondary | ICD-10-CM

## 2016-01-20 DIAGNOSIS — N946 Dysmenorrhea, unspecified: Secondary | ICD-10-CM

## 2016-01-20 NOTE — Progress Notes (Signed)
Obstetrics and Gynecology Visit New Patient Evaluation  Appointment Date: 01/20/2016  OBGYN Clinic: [facility]  Primary Care Provider: Arnoldo Morale  Referring Provider: Emergency Department  Chief Complaint: LLQ pain with periods and menorrhagia  History of Present Illness: Mary Mathis is a 40 y.o. African-American [phone] (Patient's last menstrual period was 01/05/2016.), seen for the above chief complaint. Her past medical history is significant for h/o BMI 49, h/o c-section x 2, h/o BTL, HTN.   She presents with 3 months of LLQ sharpness and shooting discomfort with her periods, which she states she has a h/o dysmenorrhea and menorrhagia since her last c-section approximately 12 years ago. The pain starts on the same day as her period and ends a day after her period ends, radiates from the left flank to the mid to left suprapubic area, is improved with ibuprofen, but completely resolves with resolution of menses. She denies vaginal itching, discharge, or bleeding between periods. She has also had increased pressure with urination, but no burning, increased urgency, or frequency.    She went to the ER on 103/1 for vaginal pain and had a negative ucx, UPT. No pelvic exam or u/s was performed at that time. CBC on 10/14 had H/H 11.2/36.3 which looks like her baselne,   Her periods have been heavy but regular since the birth of her second child in 2005. Regular monthly periods lasting 4-5 days with the heaviest day being the 2nd day, soaking through a pad every 2 hours. Pt had menarche at age 45. Her last Pap was in 2005 and she believes it is normal and no h/o abnormal paps. She is sexually active with one female partner, and was treated last month for trichomoniasis. Has a distant history (90's) of chlamydia  No fevers, chills, chest pain, SOB, nausea, vomiting, abdominal pain, dysuria, hematuria, vaginal itching, diarrhea, constipation, blood in BMs  Review of  Systems:  Her 12 point review of systems is negative or as noted in the History of Present Illness.  Past Medical History:  Past Medical History:  Diagnosis Date  . Anxiety   . Cholecystitis   . Hypertension     Past Surgical History:  Past Surgical History:  Procedure Laterality Date  . CESAREAN SECTION     x2  . CHOLECYSTECTOMY  05/04/2011   Procedure: LAPAROSCOPIC CHOLECYSTECTOMY;  Surgeon: Harl Bowie, MD;  Location: [facility];  Service: General;  Laterality: N/A;  . INTRAOPERATIVE CHOLANGIOGRAM  05/04/2011   Procedure: INTRAOPERATIVE CHOLANGIOGRAM;  Surgeon: Harl Bowie, MD;  Location: [facility];  Service: General;  Laterality: N/A;  . TUBAL LIGATION     with last c-section    Past Obstetrical History:  OB History  Gravida Para Term Preterm AB Living  4 2 2   2 2   SAB TAB Ectopic Multiple Live Births               # Outcome Date GA Lbr Len/2nd Weight Sex Delivery Anes PTL Lv  4 Term 09/20/03 [redacted]w[redacted]d    CS-Unspec     3 AB 2000          2 AB 1997          1 Term 09/20/94 [redacted]w[redacted]d    CS-Unspec        Past Gynecological History: As per HPI.  Social History:  Social History   Social History  . Marital status: Single    Spouse name: N/A  . Number of children: N/A  .  Years of education: N/A   Occupational History  . Not on file.   Social History Main Topics  . Smoking status: Never Smoker  . Smokeless tobacco: Never Used  . Alcohol use No  . Drug use: No  . Sexual activity: Yes    Birth control/ protection: None   Other Topics Concern  . Not on file   Social History Narrative  . No narrative on file    Family History:  Family History  Problem Relation Age of Onset  . Hypertension Mother   . Diabetes Father   . Hypertension Father    She denies any female cancers, bleeding or blood clotting disorders.   Medications Ms. Veloso had no medications administered during this visit. Current Outpatient Prescriptions  Medication Sig Dispense Refill  .  busPIRone (BUSPAR) 7.5 MG tablet Take 1 tablet (7.5 mg total) by mouth 2 (two) times daily. 60 tablet 0  . ibuprofen (ADVIL,MOTRIN) 800 MG tablet Take 1 tablet (800 mg total) by mouth 3 (three) times daily. 21 tablet 0  . lisinopril (PRINIVIL,ZESTRIL) 5 MG tablet Take 1 tablet (5 mg total) by mouth daily. 30 tablet 3  . metoprolol (LOPRESSOR) 50 MG tablet Take 1 tablet (50 mg total) by mouth 2 (two) times daily. 60 tablet 5  . Multiple Vitamin (MULTIVITAMIN WITH MINERALS) TABS tablet Take 1 tablet by mouth daily.    . ondansetron (ZOFRAN) 4 MG tablet Take 1 tablet (4 mg total) by mouth every 8 (eight) hours as needed for nausea or vomiting. 11 tablet 0  . meclizine (ANTIVERT) 12.5 MG tablet Take 1 tablet (12.5 mg total) by mouth 3 (three) times daily as needed for dizziness. (Patient not taking: Reported on 01/20/2016) 30 tablet 3   No current facility-administered medications for this visit.     Allergies Amoxicillin   Physical Exam:  BP (!) 147/80   Pulse 72   Ht 5\' 9"  (1.753 m)   Wt (!) 328 lb 14.4 oz (149.2 kg)   LMP 01/05/2016   BMI 48.57 kg/m  Body mass index is 48.57 kg/m. General appearance: Well nourished, well developed female in no acute distress.  Neck:  Supple, normal appearance, and no thyromegaly  Cardiovascular: normal s1 and s2.  No murmurs, rubs or gallops. Respiratory:  Clear to auscultation bilateral. Normal respiratory effort Abdomen: positive bowel sounds and no masses, hernias; diffusely non tender to palpation, non distended Neuro/Psych:  Normal mood and affect.  Skin:  Warm and dry.  Lymphatic:  No inguinal lymphadenopathy.   Pelvic exam: is limited by body habitus EGBUS: within normal limits, Vagina: within normal limits and with no blood or discharge in the vault, Cervix: normal appearing cervix without tenderness, discharge or lesions. Uterus:  Nonenlarged, anteverted, small and Adnexa:  normal adnexa and no mass, fullness, tenderness Rectovaginal:  deferred  Laboratory: as per HPI  Radiology: None  Assessment: pt stable with menorrhagia, dysmenorrhea  Plan:  Pap smear updated and gc/ct/trich testing done with patient and will call with results and d/w her re: next steps.  I d/w her that would recommend non estrogen containing therapies to suppress her cycles, such as LNG IUD or depo provera with IUD preferred. She doesn't have insurance at the moment but states that she should have some. Also told her told her that given that her s/s don't seem to be impacting her ADLs that she doesn't necessarily have to do an intervention but options were given, as discussed above.    RTC PRN  Durene Romans MD Attending [facility] [employer] [facility] [employer])

## 2016-01-22 LAB — CYTOLOGY - PAP
CHLAMYDIA, DNA PROBE: NEGATIVE
Diagnosis: NEGATIVE
HPV 16/18/45 genotyping: NEGATIVE
HPV: DETECTED — AB
NEISSERIA GONORRHEA: NEGATIVE
TRICH (WINDOWPATH): NEGATIVE

## 2016-01-28 ENCOUNTER — Telehealth: Payer: Self-pay | Admitting: *Deleted

## 2016-01-28 NOTE — Telephone Encounter (Signed)
Lynnet called today and left a message she wants results on her pap smear because she is experiencing the same symptoms she discussed when she was here.   I called [place] and notified her pap was negative for cancer but + for hrhpv and doctor has ordered another typing test to determine which hpv. She should hear from [place] in a week and if she doesn't, call us back .  She reports still having pain with cycle ,etc as discussed- is working on [employer] production planner so she can decide if she wants iud or depo-provera. Will contact us for appointment when she is ready to discuss options.

## 2016-02-05 ENCOUNTER — Telehealth: Payer: Self-pay

## 2016-02-05 DIAGNOSIS — N938 Other specified abnormal uterine and vaginal bleeding: Secondary | ICD-10-CM

## 2016-02-05 NOTE — Telephone Encounter (Signed)
Pt called requesting test results. 

## 2016-02-11 ENCOUNTER — Encounter: Payer: Self-pay | Admitting: Obstetrics and Gynecology

## 2016-02-11 DIAGNOSIS — R87610 Atypical squamous cells of undetermined significance on cytologic smear of cervix (ASC-US): Secondary | ICD-10-CM | POA: Insufficient documentation

## 2016-02-11 MED ORDER — NORETHINDRONE 0.35 MG PO TABS: 1.0000 | ORAL_TABLET | Freq: Every day | ORAL | 11 refills | Status: DC

## 2016-02-11 NOTE — Telephone Encounter (Signed)
Called patient and she states she is waiting on her additional pap results that we were being reran. Per Dr Ilda Basset, patient needs pap in one year and may begin micronor while waiting for insurance to help suppress cycles and provide relief. Informed patient. Patient verbalized understanding and would like the prescription sent in. Told patient I will send that in. Patient had no questions

## 2016-02-16 ENCOUNTER — Emergency Department (HOSPITAL_COMMUNITY)
Admission: EM | Admit: 2016-02-16 | Discharge: 2016-02-16 | Disposition: A | Discharge status: Home / Self Care | Payer: Self-pay | Attending: Emergency Medicine | Admitting: Emergency Medicine

## 2016-02-16 ENCOUNTER — Encounter (HOSPITAL_COMMUNITY): Payer: Self-pay | Admitting: *Deleted

## 2016-02-16 ENCOUNTER — Emergency Department (HOSPITAL_COMMUNITY): Payer: Self-pay

## 2016-02-16 DIAGNOSIS — I10 Essential (primary) hypertension: Secondary | ICD-10-CM | POA: Insufficient documentation

## 2016-02-16 DIAGNOSIS — R42 Dizziness and giddiness: Secondary | ICD-10-CM | POA: Insufficient documentation

## 2016-02-16 DIAGNOSIS — R110 Nausea: Secondary | ICD-10-CM | POA: Insufficient documentation

## 2016-02-16 DIAGNOSIS — R0602 Shortness of breath: Secondary | ICD-10-CM | POA: Insufficient documentation

## 2016-02-16 LAB — I-STAT CHEM 8, ED
CHLORIDE: 99 mmol/L — AB (ref 101–111)
Calcium, Ion: 1.15 mmol/L (ref 1.15–1.40)
Creatinine, Ser: 0.9 mg/dL (ref 0.44–1.00)
Glucose, Bld: 108 mg/dL — ABNORMAL HIGH (ref 65–99)
HEMATOCRIT: 37 % (ref 36.0–46.0)
Hemoglobin: 12.6 g/dL (ref 12.0–15.0)
Potassium: 3.6 mmol/L (ref 3.5–5.1)
SODIUM: 141 mmol/L (ref 135–145)
TCO2: 25 mmol/L (ref 0–100)

## 2016-02-16 LAB — CBC WITH DIFFERENTIAL/PLATELET
BASOS PCT: 0 %
Basophils Absolute: 0 10*3/uL (ref 0.0–0.1)
EOS ABS: 0.1 10*3/uL (ref 0.0–0.7)
Eosinophils Relative: 2 %
HEMATOCRIT: 37.1 % (ref 36.0–46.0)
HEMOGLOBIN: 12.3 g/dL (ref 12.0–15.0)
LYMPHS ABS: 1.5 10*3/uL (ref 0.7–4.0)
Lymphocytes Relative: 21 %
MCH: 28.2 pg (ref 26.0–34.0)
MCHC: 33.2 g/dL (ref 30.0–36.0)
MCV: 85.1 fL (ref 78.0–100.0)
Monocytes Absolute: 0.6 10*3/uL (ref 0.1–1.0)
Monocytes Relative: 8 %
NEUTROS ABS: 5.1 10*3/uL (ref 1.7–7.7)
NEUTROS PCT: 69 %
Platelets: 186 10*3/uL (ref 150–400)
RBC: 4.36 MIL/uL (ref 3.87–5.11)
RDW: 14.4 % (ref 11.5–15.5)
WBC: 7.4 10*3/uL (ref 4.0–10.5)

## 2016-02-16 LAB — TROPONIN I

## 2016-02-16 LAB — BRAIN NATRIURETIC PEPTIDE: B Natriuretic Peptide: 22.2 pg/mL (ref 0.0–100.0)

## 2016-02-16 MED ORDER — ALBUTEROL SULFATE (2.5 MG/3ML) 0.083% IN NEBU
5.0000 mg | INHALATION_SOLUTION | Freq: Once | RESPIRATORY_TRACT | Status: AC
  Administered 2016-02-16: 5 mg via RESPIRATORY_TRACT
  Filled 2016-02-16: qty 6

## 2016-02-16 NOTE — ED Triage Notes (Signed)
Patient is alert and oriented x4.  She has been having issues with dizziness that started in September but this morning she was awakened by shortness of breath.  Currently she denies any pain.  V/S stable on arrival to the ED

## 2016-02-16 NOTE — ED Provider Notes (Signed)
[place] DEPT Provider Note   CSN: [id number] Arrival date & time: 02/16/16  0407     History   Chief Complaint Chief Complaint  Patient presents with  . Shortness of Breath    HPI Mary Mathis is a 40 y.o. female with pmh of prediabetes, hypertension and obesity presents with shortness of breath this morning.  Pt had just woken up and was moving around, changing, getting ready for the day when she noticed shortness of breath.  Pt states shortness of breath is new to her.  Shortness of breath was associated with chest heaviness and nausea.  Symptoms lasted until pt arrived to ED and received albuterol neb.  Pt currently denies shortness of breath, chest heaviness/pain, nausea.  Pt denies previous heart attacks, CAD, previous echo/stress tests/caths, h/o OSA, h/o CHF, h/o asthma and COPD.  Pt has never used tobacco.  Pt denies orthopnea, PND, lower extremity edema.    Pt also reports dizziness (room spinning) that lasted 2-3 seconds.  Pt has been experiencing dizziness for 3 months.  Dizziness associated with "jittery", "uncomfortable" sensation in her entire body and difficulty with speech.  Pt states sometimes her words are jumbled together and she bites her tongue.  Pt states she has been in and out of ED for dizziness and jittery feeling multiple times before.  Pt states she has been told it could be anxiety, but she does not think it is anxiety.  Pt states she experienced dizziness with the shortness of breath, nausea and chest heaviness this morning as well.  In the past, she has only felt dizziness without SOB, nausea, chest heaviness. Given that dizziness was accompanied by new symptoms pt presented to ED.   Per chart review, pt has been to ED for  Dizziness three times before starting on 11/23/15.  ED work up so far has included EKG, troponin, TSH, T4, CT angio, CT head all normal.  Last ED visit pt was discharged with meclizine and neuro follow up. Pt states she took one  meclizine and did not follow up with neurologist.    HPI  Past Medical History:  Diagnosis Date  . Anxiety   . Cholecystitis   . Hypertension     Patient Active Problem List   Diagnosis Date Noted  . ASCUS of cervix with negative high risk HPV 02/11/2016  . Dysmenorrhea 01/20/2016  . Menorrhagia 01/10/2016  . Benign paroxysmal positional vertigo 01/10/2016  . Essential hypertension 07/06/2014  . Obesity 07/06/2014  . Pedal edema 07/06/2014    Past Surgical History:  Procedure Laterality Date  . CESAREAN SECTION     x2  . CHOLECYSTECTOMY  05/04/2011   Procedure: LAPAROSCOPIC CHOLECYSTECTOMY;  Surgeon: Harl Bowie, MD;  Location: [facility];  Service: General;  Laterality: N/A;  . INTRAOPERATIVE CHOLANGIOGRAM  05/04/2011   Procedure: INTRAOPERATIVE CHOLANGIOGRAM;  Surgeon: Harl Bowie, MD;  Location: [facility];  Service: General;  Laterality: N/A;  . TUBAL LIGATION     with last c-section    OB History    Gravida Para Term Preterm AB Living   4 2 2   2 2    SAB TAB Ectopic Multiple Live Births                   Home Medications    Prior to Admission medications   Medication Sig Start Date End Date Taking? Authorizing Provider  lisinopril (PRINIVIL,ZESTRIL) 5 MG tablet Take 1 tablet (5 mg total) by mouth daily. 01/10/16  Yes Arnoldo Morale, MD  metoprolol (LOPRESSOR) 50 MG tablet Take 1 tablet (50 mg total) by mouth 2 (two) times daily. 12/25/15  Yes Tiffany Daneil Dan, PA-C  Multiple Vitamin (MULTIVITAMIN WITH MINERALS) TABS tablet Take 1 tablet by mouth daily.   Yes Historical Provider, MD  busPIRone (BUSPAR) 7.5 MG tablet Take 1 tablet (7.5 mg total) by mouth 2 (two) times daily. Patient not taking: Reported on 02/16/2016 01/10/16   Arnoldo Morale, MD  ibuprofen (ADVIL,MOTRIN) 800 MG tablet Take 1 tablet (800 mg total) by mouth 3 (three) times daily. Patient not taking: Reported on 02/16/2016 10/19/15   Leo Grosser, MD  meclizine (ANTIVERT) 12.5 MG tablet Take 1 tablet  (12.5 mg total) by mouth 3 (three) times daily as needed for dizziness. Patient not taking: Reported on 02/16/2016 12/25/15   Brayton Caves, PA-C  norethindrone (MICRONOR,CAMILA,ERRIN) 0.35 MG tablet Take 1 tablet (0.35 mg total) by mouth daily. Patient not taking: Reported on 02/16/2016 02/11/16   Aletha Halim, MD  ondansetron (ZOFRAN) 4 MG tablet Take 1 tablet (4 mg total) by mouth every 8 (eight) hours as needed for nausea or vomiting. Patient not taking: Reported on 02/16/2016 11/29/15   Courteney Lyn Mackuen, MD    Family History Family History  Problem Relation Age of Onset  . Hypertension Mother   . Diabetes Father   . Hypertension Father     Social History Social History  Substance Use Topics  . Smoking status: Never Smoker  . Smokeless tobacco: Never Used  . Alcohol use No     Allergies   Amoxicillin   Review of Systems Review of Systems  Constitutional: Negative for diaphoresis, fatigue and fever.  HENT: Negative for sore throat and trouble swallowing.   Eyes: Negative for visual disturbance.  Respiratory: Positive for shortness of breath. Negative for cough, choking and chest tightness.   Cardiovascular: Positive for chest pain. Negative for palpitations and leg swelling.  Gastrointestinal: Positive for nausea. Negative for abdominal pain, blood in stool, constipation, diarrhea and vomiting.  Endocrine: Negative for polyuria.  Genitourinary: Negative for dysuria and flank pain.  Musculoskeletal: Negative for back pain.  Skin: Negative for rash.  Neurological: Positive for dizziness and speech difficulty. Negative for tremors, syncope, facial asymmetry, weakness, light-headedness, numbness and headaches.     Physical Exam Updated Vital Signs BP 134/82   Pulse 74   Temp 98.4 F (36.9 C) (Oral)   Resp 16   Ht 5\' 9"  (1.753 m)   Wt (!) 147.4 kg   LMP 02/02/2016   SpO2 100%   BMI 47.99 kg/m   Physical Exam  Constitutional: She is oriented to person,  place, and time. Vital signs are normal. She appears well-developed and well-nourished.  HENT:  Head: Normocephalic and atraumatic.  Nose: Nose normal.  Mouth/Throat: Oropharynx is clear and moist. No oropharyngeal exudate.  Eyes:  PERRL and EOM normal bilaterally.  Red reflex intact.  No nystagmus. Conjunctiva normal, no scleral icterus.  Neck: Normal range of motion. No JVD present. No thyromegaly present.  Cardiovascular:  No murmur heard. RRR.  S1 and S2 normal.  No murmurs or rubs.  Intact and symmetric DP and radial pulses. No orthopnea, pt tolerated HOB flat without shortness of breath or cough.  No JVD. No lower extremity edema.  Pulmonary/Chest:  Breathing effort normal with symmetric chest expansion.  Lungs CTAB without wheezing, rales, rhonchi.  No chest wall tenderness.  Abdominal: Soft. There is no tenderness.  Musculoskeletal: Normal range of motion.  Lymphadenopathy:    She has no cervical adenopathy.  Neurological: She is alert and oriented to person, place, and time.  Pt is alert and oriented.  Speech and phonation normal.  Thought process coherent.  Strength 5/5 in upper and lower extremities.  Sensation to light touch intact in upper and lower extremities. Intact finger to nose test. Gait normal, no foot drag.  Negative Romberg.  No leg drift. CN I not tested CN II full visual fields  CN III, IV, VI PEERL and EOM intact CN V light touch intact in all 3 divisions of trigeminal nerve CN VII facial nerve movements intact, symmetric CN VIII hearing intact to finger rub CN IX, X no uvula deviation, symmetric soft palate rise CN XI 5/5 SCM and trapezius strength  CN XII Tongue midline with symmetric L/R movement  Skin: Skin is warm and dry. Capillary refill takes less than 2 seconds.  Psychiatric: She has a normal mood and affect. Her behavior is normal.  Nursing note and vitals reviewed.  ED Treatments / Results  Labs (all labs ordered are listed, but only abnormal  results are displayed) Labs Reviewed  I-STAT CHEM 8, ED - Abnormal; Notable for the following:       Result Value   Chloride 99 (*)    BUN <3 (*)    Glucose, Bld 108 (*)    All other components within normal limits  CBC WITH DIFFERENTIAL/PLATELET  BRAIN NATRIURETIC PEPTIDE  TROPONIN I    EKG  EKG Interpretation  Date/Time:  Sunday February 16 2016 06:02:49 EST Ventricular Rate:  86 PR Interval:    QRS Duration: 88 QT Interval:  376 QTC Calculation: 450 R Axis:   43 Text Interpretation:  Sinus rhythm Probable left atrial enlargement No significant change since last tracing Confirmed by WICKLINE  MD, DONALD ([id number]) on 02/16/2016 6:06:51 AM       Radiology Dg Chest 2 View  Result Date: 02/16/2016 CLINICAL DATA:  Dizziness for many months, now with shortness of breath. EXAM: CHEST  2 VIEW COMPARISON:  CT chest November 25, 2015 FINDINGS: Cardiomediastinal silhouette is unremarkable for this low inspiratory examination with crowded vasculature markings. The lungs are clear without pleural effusions or focal consolidations. Trachea projects midline and there is no pneumothorax. Included soft tissue planes and osseous structures are non-suspicious. IMPRESSION: No active cardiopulmonary disease. Electronically Signed   By: Courtnay  Bloomer M.D.   On: 02/16/2016 05:53    Procedures Procedures (including critical care time)  Medications Ordered in ED Medications  albuterol (PROVENTIL) (2.5 MG/3ML) 0.083% nebulizer solution 5 mg (5 mg Nebulization Given 02/16/16 0447)     Initial Impression / Assessment and Plan / ED Course  I have reviewed the triage vital signs and the nursing notes.  Pertinent labs & imaging results that were available during my care of the patient were reviewed by me and considered in my medical decision making (see chart for details).  Clinical Course     40  yo female presents with shortness of breath, chest heaviness and nausea this morning.  On exam  pt has VSS, cardiac and pulmonary exam benign without signs of fluid overload, new murmurs or asymmetric lower extremity edema. Pt denied symptoms while in ED. Pt states albuterol neb in ED helped shortness of breath.   EKG, CXR, troponin and blood work unremarkable today.  HEART score 2.  PERC negative.  No further ED work up indicated at this time.  Pt remained asymptomatic in ED, declined zofran  for nausea.  Admission not indicated.  Pt will be discharged with close PCP follow up.    As far as dizziness, pt has had extensive ED work up.  I do not suspect vascular/brain or cardiac emergency.  Pt instructed to follow up with neurology.  Per chart pt was discharged from ED last time with neuro follow up but she did not follow up due to finances, unable to pay $200 for visit.  Pt states she will have insurance January 1, recommended she called neurology this week to set up appointment for first week of January.  ED return instructions give, pt verbalized understanding and agreed to dispo plan.   Discussed dispo plan with Dr. Jeneen Rinks who agreed with ED work up and discharge. Final Clinical Impressions(s) / ED Diagnoses   Final diagnoses:  Shortness of breath  Dizziness  Nausea    New Prescriptions Discharge Medication List as of 02/16/2016 10:25 AM       Kinnie Feil, PA-C 02/16/16 [address], PA-C 02/16/16 1041    Tanna Furry, MD 02/20/16 1736

## 2016-02-16 NOTE — ED Notes (Signed)
ED Provider at bedside. 

## 2016-02-16 NOTE — ED Notes (Signed)
Discharge instructions and follow up care reviewed with patient. Patient verbalized understanding. 

## 2016-02-16 NOTE — Discharge Instructions (Signed)
Your work up in the emergency department was overall unremarkable.   It is important that you follow up with a neurologist to discuss further work up and treatment.   Please read attached information on "Dizziness".  Return to emergency department if you develop chest pain radiating to jaw or arm, shortness of breath and nausea

## 2016-02-18 ENCOUNTER — Encounter: Payer: Self-pay | Admitting: Family Medicine

## 2016-03-06 ENCOUNTER — Other Ambulatory Visit: Payer: Self-pay | Admitting: *Deleted

## 2016-03-06 DIAGNOSIS — I10 Essential (primary) hypertension: Secondary | ICD-10-CM

## 2016-03-06 MED ORDER — LISINOPRIL 5 MG PO TABS: 5.0000 mg | ORAL_TABLET | Freq: Every day | ORAL | 2 refills | Status: DC

## 2016-03-09 ENCOUNTER — Ambulatory Visit: Payer: Self-pay | Admitting: Neurology

## 2016-03-10 ENCOUNTER — Encounter: Payer: Self-pay | Admitting: Family Medicine

## 2016-03-10 ENCOUNTER — Ambulatory Visit: Payer: BLUE CROSS/BLUE SHIELD | Attending: Family Medicine | Admitting: Family Medicine

## 2016-03-10 VITALS — BP 176/66 | HR 79 | Temp 98.8°F | Ht 67.0 in | Wt 332.2 lb

## 2016-03-10 DIAGNOSIS — E669 Obesity, unspecified: Secondary | ICD-10-CM | POA: Diagnosis not present

## 2016-03-10 DIAGNOSIS — Z9049 Acquired absence of other specified parts of digestive tract: Secondary | ICD-10-CM | POA: Diagnosis not present

## 2016-03-10 DIAGNOSIS — Z9889 Other specified postprocedural states: Secondary | ICD-10-CM | POA: Diagnosis not present

## 2016-03-10 DIAGNOSIS — F419 Anxiety disorder, unspecified: Secondary | ICD-10-CM | POA: Diagnosis not present

## 2016-03-10 DIAGNOSIS — R1013 Epigastric pain: Secondary | ICD-10-CM

## 2016-03-10 DIAGNOSIS — IMO0001 Reserved for inherently not codable concepts without codable children: Secondary | ICD-10-CM

## 2016-03-10 DIAGNOSIS — Z79899 Other long term (current) drug therapy: Secondary | ICD-10-CM | POA: Diagnosis not present

## 2016-03-10 DIAGNOSIS — I10 Essential (primary) hypertension: Secondary | ICD-10-CM | POA: Diagnosis not present

## 2016-03-10 DIAGNOSIS — Z5189 Encounter for other specified aftercare: Secondary | ICD-10-CM | POA: Insufficient documentation

## 2016-03-10 DIAGNOSIS — K580 Irritable bowel syndrome with diarrhea: Secondary | ICD-10-CM | POA: Insufficient documentation

## 2016-03-10 DIAGNOSIS — Z6841 Body Mass Index (BMI) 40.0 and over, adult: Secondary | ICD-10-CM | POA: Insufficient documentation

## 2016-03-10 DIAGNOSIS — E6609 Other obesity due to excess calories: Secondary | ICD-10-CM

## 2016-03-10 MED ORDER — METOPROLOL TARTRATE 50 MG PO TABS: 50.0000 mg | ORAL_TABLET | Freq: Two times a day (BID) | ORAL | 5 refills | Status: DC

## 2016-03-10 MED ORDER — OMEPRAZOLE 20 MG PO CPDR: 20.0000 mg | DELAYED_RELEASE_CAPSULE | Freq: Every day | ORAL | 3 refills | Status: DC

## 2016-03-10 MED ORDER — BUSPIRONE HCL 7.5 MG PO TABS
7.5000 mg | ORAL_TABLET | Freq: Two times a day (BID) | ORAL | 1 refills | Status: DC
Start: 2016-03-10 — End: 2016-07-21

## 2016-03-10 MED ORDER — DICYCLOMINE HCL 10 MG PO CAPS: 10.0000 mg | ORAL_CAPSULE | Freq: Three times a day (TID) | ORAL | 1 refills | Status: DC

## 2016-03-10 MED ORDER — LISINOPRIL 20 MG PO TABS: 20.0000 mg | ORAL_TABLET | Freq: Every day | ORAL | 5 refills | Status: DC

## 2016-03-10 MED ORDER — LISINOPRIL 20 MG PO TABS
20.0000 mg | ORAL_TABLET | Freq: Every day | ORAL | 5 refills | Status: DC
Start: 2016-03-10 — End: 2016-06-05

## 2016-03-10 MED ORDER — BUSPIRONE HCL 7.5 MG PO TABS: 7.5000 mg | ORAL_TABLET | Freq: Two times a day (BID) | ORAL | 1 refills | Status: DC

## 2016-03-10 NOTE — Progress Notes (Signed)
Subjective:  Patient ID: Mary Mathis, female    DOB: September 24, 1975  Age: 41 y.o. MRN: [id number]  CC: Hypertension and Abdominal Pain   HPI Mary Mathis is a 41 year old female with a history of hypertension, generalized anxiety who presents today for follow-up visits and request a breast exam as she had a Pap smear done at [facility] but did not get a breast exam. She denies breast pain or lumps or history of breast cancer in her family.  She has been out of her antihypertensives and is needing refills but endorses blood pressures have been elevated at home despite compliance.  She complains of epigastric pain which is intermittent and also having to move her bowel after every meal which is unusual for her. Denies nausea, vomiting or reflux. She is concerned that she keeps having different symptoms which have led to ED presentations and is having to call her mom at early hours of the morning to discuss this but then notes that after speaking with her mom "she calms down". The dizziness she previously complained about has resolved and she now sometimes is afraid that she might have a heart attack. I had placed her on BuSpar which she does not take.  She does not get any much exercise isn't is not compliant with a low-sodium diet.  Past Medical History:  Diagnosis Date  . Anxiety   . Cholecystitis   . Hypertension     Past Surgical History:  Procedure Laterality Date  . CESAREAN SECTION     x2  . CHOLECYSTECTOMY  05/04/2011   Procedure: LAPAROSCOPIC CHOLECYSTECTOMY;  Surgeon: Harl Bowie, MD;  Location: [facility];  Service: General;  Laterality: N/A;  . INTRAOPERATIVE CHOLANGIOGRAM  05/04/2011   Procedure: INTRAOPERATIVE CHOLANGIOGRAM;  Surgeon: Harl Bowie, MD;  Location: [facility];  Service: General;  Laterality: N/A;  . TUBAL LIGATION     with last c-section    Allergies  Allergen Reactions  . Amoxicillin Swelling and Rash    Swelling of the ankles and  hands  Has patient had a PCN reaction causing immediate rash, facial/tongue/throat swelling, SOB or lightheadedness with hypotension: Yes Has patient had a PCN reaction causing severe rash involving mucus membranes or skin necrosis: No Has patient had a PCN reaction that required hospitalization Yes Has patient had a PCN reaction occurring within the last 10 years: Yes If all of the above answers are "NO", then may proceed with Cephalosporin use.      Outpatient Medications Prior to Visit  Medication Sig Dispense Refill  . ibuprofen (ADVIL,MOTRIN) 800 MG tablet Take 1 tablet (800 mg total) by mouth 3 (three) times daily. 21 tablet 0  . Multiple Vitamin (MULTIVITAMIN WITH MINERALS) TABS tablet Take 1 tablet by mouth daily.    Marland Kitchen lisinopril (PRINIVIL,ZESTRIL) 5 MG tablet Take 1 tablet (5 mg total) by mouth daily. 30 tablet 2  . metoprolol (LOPRESSOR) 50 MG tablet Take 1 tablet (50 mg total) by mouth 2 (two) times daily. 60 tablet 5  . meclizine (ANTIVERT) 12.5 MG tablet Take 1 tablet (12.5 mg total) by mouth 3 (three) times daily as needed for dizziness. (Patient not taking: Reported on 03/10/2016) 30 tablet 3  . norethindrone (MICRONOR,CAMILA,ERRIN) 0.35 MG tablet Take 1 tablet (0.35 mg total) by mouth daily. (Patient not taking: Reported on 03/10/2016) 1 Package 11  . busPIRone (BUSPAR) 7.5 MG tablet Take 1 tablet (7.5 mg total) by mouth 2 (two) times daily. (Patient not taking: Reported on  03/10/2016) 60 tablet 0  . ondansetron (ZOFRAN) 4 MG tablet Take 1 tablet (4 mg total) by mouth every 8 (eight) hours as needed for nausea or vomiting. (Patient not taking: Reported on 02/16/2016) 11 tablet 0   No facility-administered medications prior to visit.     ROS Review of Systems  Constitutional: Negative for activity change, appetite change and fatigue.  HENT: Negative for congestion, sinus pressure and sore throat.   Eyes: Negative for visual disturbance.  Respiratory: Negative for cough, chest  tightness, shortness of breath and wheezing.   Cardiovascular: Negative for chest pain and palpitations.  Gastrointestinal: Positive for abdominal pain and diarrhea. Negative for abdominal distention and constipation.  Endocrine: Negative for polydipsia.  Genitourinary: Negative for dysuria and frequency.  Musculoskeletal: Negative for arthralgias and back pain.  Skin: Negative for rash.  Neurological: Negative for tremors, light-headedness and numbness.  Hematological: Does not bruise/bleed easily.  Psychiatric/Behavioral: Negative for agitation and behavioral problems.    Objective:  BP (!) 176/66 (BP Location: Right Arm, Patient Position: Sitting, Cuff Size: Large)   Pulse 79   Temp 98.8 F (37.1 C) (Oral)   Ht 5\' 7"  (1.702 m)   Wt (!) 332 lb 3.2 oz (150.7 kg)   LMP 03/04/2016   SpO2 100%   BMI 52.03 kg/m   BP/Weight 03/10/2016 02/16/2016 [id number]  Systolic BP 0000000 Q000111Q Q000111Q  Diastolic BP 66 82 80  Wt. (Lbs) 332.2 325 328.9  BMI 52.03 47.99 48.57      Physical Exam  Constitutional: She is oriented to person, place, and time. She appears well-developed and well-nourished.  Morbidly obese  Cardiovascular: Normal rate, normal heart sounds and intact distal pulses.   No murmur heard. Pulmonary/Chest: Effort normal and breath sounds normal. She has no wheezes. She has no rales. She exhibits no tenderness. Right breast exhibits no mass, no nipple discharge and no tenderness. Left breast exhibits mass and tenderness. Left breast exhibits no nipple discharge.  Abdominal: Soft. Bowel sounds are normal. She exhibits no distension and no mass. There is no tenderness.  Musculoskeletal: Normal range of motion.  Neurological: She is alert and oriented to person, place, and time.     Assessment & Plan:   1. Epigastric pain We'll treat presumptively for gastritis - omeprazole (PRILOSEC) 20 MG capsule; Take 1 capsule (20 mg total) by mouth daily.  Dispense: 30 capsule; Refill: 3  2.  Anxiety Underlying anxiety could be exacerbating physical symptoms Advised to resume taking BuSpar - busPIRone (BUSPAR) 7.5 MG tablet; Take 1 tablet (7.5 mg total) by mouth 2 (two) times daily.  Dispense: 60 tablet; Refill: 1  3. Irritable bowel syndrome with diarrhea Patient advised to adjust frequency of dosing depending on bowel movement - dicyclomine (BENTYL) 10 MG capsule; Take 1 capsule (10 mg total) by mouth 3 (three) times daily before meals.  Dispense: 90 capsule; Refill: 1  4. Essential hypertension Uncontrolled due to running out of medications Increased dose of lisinopril as home blood pressures have been elevated Low-sodium diet We'll check potassium at next visit - metoprolol (LOPRESSOR) 50 MG tablet; Take 1 tablet (50 mg total) by mouth 2 (two) times daily.  Dispense: 60 tablet; Refill: 5 - lisinopril (PRINIVIL,ZESTRIL) 20 MG tablet; Take 1 tablet (20 mg total) by mouth daily.  Dispense: 30 tablet; Refill: 5 - COMPLETE METABOLIC PANEL WITH GFR; Future - Lipid Panel w/reflex Direct LDL; Future  5. Class 3 obesity due to excess calories with serious comorbidity and body mass index (BMI)  of 45.0 to 49.9 in adult [facility]) Discussed reducing portion sizes, exercise regimen We have discussed obesity and its multiple complications and the best she can do for herself is to lose weight and modify her risk factors  Meds ordered this encounter  Medications  . busPIRone (BUSPAR) 7.5 MG tablet    Sig: Take 1 tablet (7.5 mg total) by mouth 2 (two) times daily.    Dispense:  60 tablet    Refill:  1  . metoprolol (LOPRESSOR) 50 MG tablet    Sig: Take 1 tablet (50 mg total) by mouth 2 (two) times daily.    Dispense:  60 tablet    Refill:  5  . lisinopril (PRINIVIL,ZESTRIL) 20 MG tablet    Sig: Take 1 tablet (20 mg total) by mouth daily.    Dispense:  30 tablet    Refill:  5    Discontinue previous dose  . omeprazole (PRILOSEC) 20 MG capsule    Sig: Take 1 capsule (20 mg total) by  mouth daily.    Dispense:  30 capsule    Refill:  3  . dicyclomine (BENTYL) 10 MG capsule    Sig: Take 1 capsule (10 mg total) by mouth 3 (three) times daily before meals.    Dispense:  90 capsule    Refill:  1    Follow-up: Return in about 1 month (around 04/10/2016) for Follow-up on hypertension.   Arnoldo Morale MD

## 2016-03-10 NOTE — Progress Notes (Signed)
Had a pap smear a couple of weeks ago- but no breast exam Medication refills- both BP meds

## 2016-03-17 ENCOUNTER — Other Ambulatory Visit: Payer: BLUE CROSS/BLUE SHIELD

## 2016-04-10 ENCOUNTER — Ambulatory Visit: Payer: BLUE CROSS/BLUE SHIELD | Attending: Family Medicine | Admitting: Family Medicine

## 2016-04-10 ENCOUNTER — Encounter: Payer: Self-pay | Admitting: Family Medicine

## 2016-04-10 VITALS — BP 160/78 | HR 69 | Temp 98.0°F | Ht 67.0 in | Wt 333.6 lb

## 2016-04-10 DIAGNOSIS — F411 Generalized anxiety disorder: Secondary | ICD-10-CM | POA: Diagnosis not present

## 2016-04-10 DIAGNOSIS — R232 Flushing: Secondary | ICD-10-CM | POA: Diagnosis not present

## 2016-04-10 DIAGNOSIS — Z9049 Acquired absence of other specified parts of digestive tract: Secondary | ICD-10-CM | POA: Insufficient documentation

## 2016-04-10 DIAGNOSIS — F419 Anxiety disorder, unspecified: Secondary | ICD-10-CM

## 2016-04-10 DIAGNOSIS — Z9889 Other specified postprocedural states: Secondary | ICD-10-CM | POA: Insufficient documentation

## 2016-04-10 DIAGNOSIS — Z5189 Encounter for other specified aftercare: Secondary | ICD-10-CM | POA: Insufficient documentation

## 2016-04-10 DIAGNOSIS — Z9851 Tubal ligation status: Secondary | ICD-10-CM | POA: Insufficient documentation

## 2016-04-10 DIAGNOSIS — I10 Essential (primary) hypertension: Secondary | ICD-10-CM

## 2016-04-10 DIAGNOSIS — Z79899 Other long term (current) drug therapy: Secondary | ICD-10-CM | POA: Diagnosis not present

## 2016-04-10 LAB — BASIC METABOLIC PANEL
BUN: 7 mg/dL (ref 7–25)
CALCIUM: 8.8 mg/dL (ref 8.6–10.2)
CO2: 25 mmol/L (ref 20–31)
CREATININE: 0.91 mg/dL (ref 0.50–1.10)
Chloride: 107 mmol/L (ref 98–110)
GLUCOSE: 98 mg/dL (ref 65–99)
POTASSIUM: 4.4 mmol/L (ref 3.5–5.3)
Sodium: 139 mmol/L (ref 135–146)

## 2016-04-10 MED ORDER — CLONIDINE HCL 0.1 MG PO TABS: 0.1000 mg | ORAL_TABLET | Freq: Every day | ORAL | 3 refills | Status: DC

## 2016-04-10 MED ORDER — CARVEDILOL 12.5 MG PO TABS: 12.5000 mg | ORAL_TABLET | Freq: Two times a day (BID) | ORAL | 3 refills | Status: DC

## 2016-04-10 NOTE — Progress Notes (Signed)
Subjective:  Patient ID: Mary Mathis, female    DOB: 23-Apr-1975  Age: 41 y.o. MRN: [id number]  CC: Hypertension; "blood rush" (thru entire body-especially her head); and Anxiety   HPI Mary Mathis is a 41 year old female with a history of hypertension, generalized anxiety who presents today for follow-up visit on hypertension and endorses compliance with her antihypertensives and her blood pressure is still elevated.  She complains of anxiety not being controlled on BuSpar which she takes as needed. Also complains of "a feeling that starts up from her chest and spreads her head, her arms and her lower extremities" and last for a few seconds. Denies hot or cold sensation and is unable to describe this any better. Denies headaches, nausea or blurry vision. Denies night sweats or palpitations. She states she has had similar symptoms in the past and is concerned that something is wrong. Micronor appears on her med list however she states she is not taking this.  Past Medical History:  Diagnosis Date  . Anxiety   . Cholecystitis   . Hypertension     Past Surgical History:  Procedure Laterality Date  . CESAREAN SECTION     x2  . CHOLECYSTECTOMY  05/04/2011   Procedure: LAPAROSCOPIC CHOLECYSTECTOMY;  Surgeon: Harl Bowie, MD;  Location: [facility];  Service: General;  Laterality: N/A;  . INTRAOPERATIVE CHOLANGIOGRAM  05/04/2011   Procedure: INTRAOPERATIVE CHOLANGIOGRAM;  Surgeon: Harl Bowie, MD;  Location: [facility];  Service: General;  Laterality: N/A;  . TUBAL LIGATION     with last c-section    Allergies  Allergen Reactions  . Amoxicillin Swelling and Rash    Swelling of the ankles and hands  Has patient had a PCN reaction causing immediate rash, facial/tongue/throat swelling, SOB or lightheadedness with hypotension: Yes Has patient had a PCN reaction causing severe rash involving mucus membranes or skin necrosis: No Has patient had a PCN reaction that required  hospitalization Yes Has patient had a PCN reaction occurring within the last 10 years: Yes If all of the above answers are "NO", then may proceed with Cephalosporin use.      Outpatient Medications Prior to Visit  Medication Sig Dispense Refill  . busPIRone (BUSPAR) 7.5 MG tablet Take 1 tablet (7.5 mg total) by mouth 2 (two) times daily. 60 tablet 1  . lisinopril (PRINIVIL,ZESTRIL) 20 MG tablet Take 1 tablet (20 mg total) by mouth daily. 30 tablet 5  . Multiple Vitamin (MULTIVITAMIN WITH MINERALS) TABS tablet Take 1 tablet by mouth daily.    . metoprolol (LOPRESSOR) 50 MG tablet Take 1 tablet (50 mg total) by mouth 2 (two) times daily. 60 tablet 5  . dicyclomine (BENTYL) 10 MG capsule Take 1 capsule (10 mg total) by mouth 3 (three) times daily before meals. (Patient not taking: Reported on 04/10/2016) 90 capsule 1  . ibuprofen (ADVIL,MOTRIN) 800 MG tablet Take 1 tablet (800 mg total) by mouth 3 (three) times daily. (Patient not taking: Reported on 04/10/2016) 21 tablet 0  . meclizine (ANTIVERT) 12.5 MG tablet Take 1 tablet (12.5 mg total) by mouth 3 (three) times daily as needed for dizziness. (Patient not taking: Reported on 03/10/2016) 30 tablet 3  . omeprazole (PRILOSEC) 20 MG capsule Take 1 capsule (20 mg total) by mouth daily. (Patient not taking: Reported on 04/10/2016) 30 capsule 3  . norethindrone (MICRONOR,CAMILA,ERRIN) 0.35 MG tablet Take 1 tablet (0.35 mg total) by mouth daily. (Patient not taking: Reported on 03/10/2016) 1 Package 11  No facility-administered medications prior to visit.     ROS Review of Systems  Constitutional: Negative for activity change, appetite change and fatigue.  HENT: Negative for congestion, sinus pressure and sore throat.   Eyes: Negative for visual disturbance.  Respiratory: Negative for cough, chest tightness, shortness of breath and wheezing.   Cardiovascular: Negative for chest pain and palpitations.  Gastrointestinal: Negative for abdominal distention,  abdominal pain and constipation.  Endocrine: Negative for polydipsia.  Genitourinary: Negative for dysuria and frequency.  Musculoskeletal: Negative for arthralgias and back pain.  Skin: Negative for rash.  Neurological: Negative for tremors, light-headedness and numbness.  Hematological: Does not bruise/bleed easily.  Psychiatric/Behavioral: Negative for agitation and behavioral problems.       Positive for anxiety    Objective:  BP (!) 160/78 (BP Location: Right Arm, Patient Position: Sitting, Cuff Size: Large)   Pulse 69   Temp 98 F (36.7 C) (Oral)   Ht 5\' 7"  (1.702 m)   Wt (!) 333 lb 9.6 oz (151.3 kg)   SpO2 98%   BMI 52.25 kg/m   BP/Weight 04/10/2016 03/10/2016 Q000111Q  Systolic BP 0000000 0000000 Q000111Q  Diastolic BP 78 66 82  Wt. (Lbs) 333.6 332.2 325  BMI 52.25 52.03 47.99      Physical Exam  Constitutional: She is oriented to person, place, and time. She appears well-developed and well-nourished.  Cardiovascular: Normal rate, normal heart sounds and intact distal pulses.   No murmur heard. Pulmonary/Chest: Effort normal and breath sounds normal. She has no wheezes. She has no rales. She exhibits no tenderness.  Abdominal: Soft. Bowel sounds are normal. She exhibits no distension and no mass. There is no tenderness.  Musculoskeletal: Normal range of motion.  Neurological: She is alert and oriented to person, place, and time.     Assessment & Plan:   1. Anxiety Uncontrolled Currently on BuSpar  2. Essential hypertension Uncontrolled Discontinue metoprolol and commence carvedilol Low-sodium diet - carvedilol (COREG) 12.5 MG tablet; Take 1 tablet (12.5 mg total) by mouth 2 (two) times daily with a meal.  Dispense: 60 tablet; Refill: 3 - Basic Metabolic Panel  3. Flushing reaction Patient is unclear about description of symptoms Suspicious for premenopausal symptoms even though this seems to be early Cannot exclude underlying anxiety We'll refer to endocrine if  symptoms persist - cloNIDine (CATAPRES) 0.1 MG tablet; Take 1 tablet (0.1 mg total) by mouth at bedtime.  Dispense: 30 tablet; Refill: 3   Meds ordered this encounter  Medications  . carvedilol (COREG) 12.5 MG tablet    Sig: Take 1 tablet (12.5 mg total) by mouth 2 (two) times daily with a meal.    Dispense:  60 tablet    Refill:  3    Discontinue metoprolol  . cloNIDine (CATAPRES) 0.1 MG tablet    Sig: Take 1 tablet (0.1 mg total) by mouth at bedtime.    Dispense:  30 tablet    Refill:  3    Follow-up: Return in about 3 weeks (around 05/01/2016) for follow up on Hypertension.   Arnoldo Morale MD

## 2016-04-24 ENCOUNTER — Telehealth: Payer: Self-pay | Admitting: *Deleted

## 2016-04-24 DIAGNOSIS — F419 Anxiety disorder, unspecified: Secondary | ICD-10-CM

## 2016-04-24 NOTE — Telephone Encounter (Signed)
Patient verified DOB Patient is aware of lab results being normal Patient states she is in the ED currently with the same symptoms. Patient states she has been taking the Buspar TID and had been provided no relief. MA informed patient of the concern being routed to the PCP for advice.

## 2016-04-24 NOTE — Telephone Encounter (Signed)
I have referred her to Psych for management of Anxiety.

## 2016-04-24 NOTE — Telephone Encounter (Signed)
Patient verified DOB Patient is aware of PCP referring the patient to psychology to assist in the patients increased anxiety. The patient is aware of receiving a phone call with her initial appointment. Patient expressed her understanding and had no further questions at this time.

## 2016-04-24 NOTE — Telephone Encounter (Signed)
-----   Message from Arnoldo Morale, MD sent at 04/13/2016  8:37 AM EST ----- Please inform the patient that labs are normal. Thank you.

## 2016-05-27 ENCOUNTER — Ambulatory Visit (HOSPITAL_COMMUNITY): Payer: BLUE CROSS/BLUE SHIELD | Admitting: Psychiatry

## 2016-06-05 ENCOUNTER — Encounter: Payer: Self-pay | Admitting: Family Medicine

## 2016-06-05 ENCOUNTER — Ambulatory Visit: Payer: BLUE CROSS/BLUE SHIELD | Attending: Family Medicine | Admitting: Family Medicine

## 2016-06-05 VITALS — BP 145/94 | HR 61 | Temp 98.2°F | Resp 18 | Ht 67.0 in | Wt 337.2 lb

## 2016-06-05 DIAGNOSIS — R102 Pelvic and perineal pain unspecified side: Secondary | ICD-10-CM

## 2016-06-05 DIAGNOSIS — R202 Paresthesia of skin: Secondary | ICD-10-CM | POA: Diagnosis not present

## 2016-06-05 DIAGNOSIS — I10 Essential (primary) hypertension: Secondary | ICD-10-CM | POA: Diagnosis not present

## 2016-06-05 DIAGNOSIS — Z79899 Other long term (current) drug therapy: Secondary | ICD-10-CM | POA: Insufficient documentation

## 2016-06-05 DIAGNOSIS — N920 Excessive and frequent menstruation with regular cycle: Secondary | ICD-10-CM | POA: Diagnosis not present

## 2016-06-05 MED ORDER — LISINOPRIL 40 MG PO TABS: 40.0000 mg | ORAL_TABLET | Freq: Every day | ORAL | 0 refills | Status: DC

## 2016-06-05 MED ORDER — IBUPROFEN 800 MG PO TABS: 800.0000 mg | ORAL_TABLET | Freq: Three times a day (TID) | ORAL | 0 refills | Status: DC | PRN

## 2016-06-05 NOTE — Progress Notes (Signed)
JAPatient is here for several cycles that  her menstrual been heavy  Patient started her cycle on Wednesday 06/03/16 and it been worst heavy than previous cycle  Patient stated that she had pelvic throbbing pain before cycle  Patient has taking her BP medication for today  Patient has eaten for today

## 2016-06-05 NOTE — Progress Notes (Signed)
Subjective:  Patient ID: Mary Mathis, female    DOB: 1975-09-18  Age: 41 y.o. MRN: [id number]  CC: Establish Care   HPI Mary Mathis presents for   Heavy menstrual cycles: Report heavy, regular menstrual cycles each month. Reports using 4-5 super absorbent pads. Last menstrual period was 05/04/2016. History of g gynecology referral in the past was suggested that she consider IUD for Depo-Provera with IUD use. Denies any history of fibroids or any SOB.  Hypertension: Reports adherence with hypertensive medications. Reports still taking metoprolol 50 mg twice a day with last pharmacy refill  given in January week for 90 day supply.     Left hand paresthesias: 2 weeks history. Ocassional. Denies any history of injury, decreased hand grip or swelling. Denies taking for symptoms.   Outpatient Medications Prior to Visit  Medication Sig Dispense Refill  . busPIRone (BUSPAR) 7.5 MG tablet Take 1 tablet (7.5 mg total) by mouth 2 (two) times daily. 60 tablet 1  . carvedilol (COREG) 12.5 MG tablet Take 1 tablet (12.5 mg total) by mouth 2 (two) times daily with a meal. 60 tablet 3  . cloNIDine (CATAPRES) 0.1 MG tablet Take 1 tablet (0.1 mg total) by mouth at bedtime. 30 tablet 3  . Multiple Vitamin (MULTIVITAMIN WITH MINERALS) TABS tablet Take 1 tablet by mouth daily.    Marland Kitchen lisinopril (PRINIVIL,ZESTRIL) 20 MG tablet Take 1 tablet (20 mg total) by mouth daily. 30 tablet 5  . dicyclomine (BENTYL) 10 MG capsule Take 1 capsule (10 mg total) by mouth 3 (three) times daily before meals. (Patient not taking: Reported on 04/10/2016) 90 capsule 1  . meclizine (ANTIVERT) 12.5 MG tablet Take 1 tablet (12.5 mg total) by mouth 3 (three) times daily as needed for dizziness. (Patient not taking: Reported on 03/10/2016) 30 tablet 3  . omeprazole (PRILOSEC) 20 MG capsule Take 1 capsule (20 mg total) by mouth daily. (Patient not taking: Reported on 04/10/2016) 30 capsule 3  . ibuprofen (ADVIL,MOTRIN) 800 MG tablet  Take 1 tablet (800 mg total) by mouth 3 (three) times daily. (Patient not taking: Reported on 04/10/2016) 21 tablet 0   No facility-administered medications prior to visit.     ROS Review of Systems  Respiratory: Negative.   Cardiovascular: Negative.   Gastrointestinal: Negative.   Genitourinary: Positive for menstrual problem.  Musculoskeletal:       Paresthesias left hand.  Skin: Negative.     Objective:  BP (!) 145/94 (BP Location: Right Arm, Patient Position: Sitting, Cuff Size: Normal)   Pulse 61   Temp 98.2 F (36.8 C) (Oral)   Resp 18   Ht 5\' 7"  (1.702 m)   Wt (!) 337 lb 3.2 oz (153 kg)   LMP 06/03/2016   SpO2 99%   BMI 52.81 kg/m   BP/Weight 06/05/2016 08/01/6071 08/30/624  Systolic BP 948 546 270  Diastolic BP 94 78 66  Wt. (Lbs) 337.2 333.6 332.2  BMI 52.81 52.25 52.03     Physical Exam  HENT:  Head: Normocephalic and atraumatic.  Right Ear: External ear normal.  Left Ear: External ear normal.  Nose: Nose normal.  Mouth/Throat: Oropharynx is clear and moist.  Eyes: Conjunctivae are normal. Pupils are equal, round, and reactive to light.  Neck: No JVD present.  Cardiovascular: Normal rate, regular rhythm, normal heart sounds and intact distal pulses.   Pulmonary/Chest: Effort normal and breath sounds normal.  Abdominal: Soft. Bowel sounds are normal.  Musculoskeletal: Normal range of motion.  Right hand: Normal strength noted.       Left hand: Normal strength noted.  Negative Phalen signs and tinel's signs .   Skin: Skin is warm and dry.  Nursing note and vitals reviewed.   Assessment & Plan:   Problem List Items Addressed This Visit      Cardiovascular and Mediastinum   Essential hypertension - Primary   -BP sill not at goal despite metoprolol use. Will increase dose of lisinopril for better BP control.   Schedule BP recheck in 2 weeks with nurse.   Relevant Medications   lisinopril (PRINIVIL,ZESTRIL) 40 MG tablet     Other   Menorrhagia    Relevant Orders   CBC with Differential (Completed)   US Transvaginal Non-OB   US Pelvis Complete   Ambulatory referral to Gynecology    Other Visit Diagnoses    Left hand paresthesia       Relevant Medications   ibuprofen (ADVIL,MOTRIN) 800 MG tablet   Pelvic pressure in female       Relevant Orders   POCT urinalysis dipstick      Meds ordered this encounter  Medications  . ibuprofen (ADVIL,MOTRIN) 800 MG tablet    Sig: Take 1 tablet (800 mg total) by mouth every 8 (eight) hours as needed for moderate pain or cramping (Take with food.).    Dispense:  40 tablet    Refill:  0    Order Specific Question:   Supervising Provider    Answer:   Tresa Garter [id number]  . lisinopril (PRINIVIL,ZESTRIL) 40 MG tablet    Sig: Take 1 tablet (40 mg total) by mouth daily.    Dispense:  90 tablet    Refill:  0    Discontinue previous dose    Order Specific Question:   Supervising Provider    Answer:   Tresa Garter [[id number]]    Follow-up: Return or if symptoms worsen or fail to improve. Return in about 2 weeks (around 06/19/2016),for Blood pressure check with clinical RN.   Alfonse Spruce FNP

## 2016-06-05 NOTE — Patient Instructions (Signed)
Menorrhagia Menorrhagia is when your menstrual periods are heavy or last longer than usual. Follow these instructions at home:  Only take medicine as told by your doctor.  Take any iron pills as told by your doctor. Heavy bleeding may cause low levels of iron in your body.  Do not take aspirin 1 week before or during your period. Aspirin can make the bleeding worse.  Lie down for a while if you change your tampon or pad more than once in 2 hours. This may help lessen the bleeding.  Eat a healthy diet and foods with iron. These foods include leafy green vegetables, meat, liver, eggs, and whole grain breads and cereals.  Do not try to lose weight. Wait until the heavy bleeding has stopped and your iron level is normal. Contact a doctor if:  You soak through a pad or tampon every 1 or 2 hours, and this happens every time you have a period.  You need to use pads and tampons at the same time because you are bleeding so much.  You need to change your pad or tampon during the night.  You have a period that lasts for more than 8 days.  You pass clots bigger than 1 inch (2.5 cm) wide.  You have irregular periods that happen more or less often than once a month.  You feel dizzy or pass out (faint).  You feel very weak or tired.  You feel short of breath or feel your heart is beating too fast when you exercise.  You feel sick to your stomach (nausea) and you throw up (vomit) while you are taking your medicine.  You have watery poop (diarrhea) while you are taking your medicine.  You have any problems that may be related to the medicine you are taking. Get help right away if:  You soak through 4 or more pads or tampons in 2 hours.  You have any bleeding while you are pregnant. This information is not intended to replace advice given to you by your health care provider. Make sure you discuss any questions you have with your health care provider. Document Released: 11/26/2007 Document  Revised: 07/25/2015 Document Reviewed: 08/18/2012 Elsevier Interactive Patient Education  2017 [employer].   Intrauterine Device Information An intrauterine device (IUD) is inserted into your uterus to prevent pregnancy. There are two types of IUDs available:  Copper IUD-This type of IUD is wrapped in copper wire and is placed inside the uterus. Copper makes the uterus and fallopian tubes produce a fluid that kills sperm. The copper IUD can stay in place for 10 years.  Hormone IUD-This type of IUD contains the hormone progestin (synthetic progesterone). The hormone thickens the cervical mucus and prevents sperm from entering the uterus. It also thins the uterine lining to prevent implantation of a fertilized egg. The hormone can weaken or kill the sperm that get into the uterus. One type of hormone IUD can stay in place for 5 years, and another type can stay in place for 3 years. Your health care provider will make sure you are a good candidate for a contraceptive IUD. Discuss with your health care provider the possible side effects. Advantages of an intrauterine device  IUDs are highly effective, reversible, long acting, and low maintenance.  There are no estrogen-related side effects.  An IUD can be used when breastfeeding.  IUDs are not associated with weight gain.  The copper IUD works immediately after insertion.  The hormone IUD works right away if  inserted within 7 days of your period starting. You will need to use a backup method of birth control for 7 days if the hormone IUD is inserted at any other time in your cycle.  The copper IUD does not interfere with your female hormones.  The hormone IUD can make heavy menstrual periods lighter and decrease cramping.  The hormone IUD can be used for 3 or 5 years.  The copper IUD can be used for 10 years. Disadvantages of an intrauterine device  The hormone IUD can be associated with irregular bleeding patterns.  The copper IUD  can make your menstrual flow heavier and more painful.  You may experience cramping and vaginal bleeding after insertion. This information is not intended to replace advice given to you by your health care provider. Make sure you discuss any questions you have with your health care provider. Document Released: 01/21/2004 Document Revised: 07/25/2015 Document Reviewed: 08/07/2012 Elsevier Interactive Patient Education  2017 [employer].

## 2016-06-06 LAB — CBC WITH DIFFERENTIAL/PLATELET
BASOS ABS: 0 10*3/uL (ref 0.0–0.2)
Basos: 0 %
EOS (ABSOLUTE): 0.3 10*3/uL (ref 0.0–0.4)
EOS: 4 %
HEMATOCRIT: 36.4 % (ref 34.0–46.6)
HEMOGLOBIN: 11.7 g/dL (ref 11.1–15.9)
IMMATURE GRANS (ABS): 0 10*3/uL (ref 0.0–0.1)
IMMATURE GRANULOCYTES: 0 %
LYMPHS ABS: 2.5 10*3/uL (ref 0.7–3.1)
LYMPHS: 35 %
MCH: 27.7 pg (ref 26.6–33.0)
MCHC: 32.1 g/dL (ref 31.5–35.7)
MCV: 86 fL (ref 79–97)
MONOCYTES: 9 %
Monocytes Absolute: 0.6 10*3/uL (ref 0.1–0.9)
NEUTROS PCT: 52 %
Neutrophils Absolute: 3.7 10*3/uL (ref 1.4–7.0)
Platelets: 241 10*3/uL (ref 150–379)
RBC: 4.23 x10E6/uL (ref 3.77–5.28)
RDW: 14.2 % (ref 12.3–15.4)
WBC: 7.1 10*3/uL (ref 3.4–10.8)

## 2016-06-08 ENCOUNTER — Ambulatory Visit (HOSPITAL_COMMUNITY): Payer: BLUE CROSS/BLUE SHIELD | Admitting: Psychiatry

## 2016-06-08 ENCOUNTER — Other Ambulatory Visit: Payer: Self-pay | Admitting: Family Medicine

## 2016-06-08 LAB — POCT URINALYSIS DIPSTICK
Bilirubin, UA: NEGATIVE
Glucose, UA: NEGATIVE
LEUKOCYTES UA: NEGATIVE
NITRITE UA: NEGATIVE
PH UA: 5.5 (ref 5.0–8.0)
Spec Grav, UA: 1.02 (ref 1.030–1.035)
UROBILINOGEN UA: 0.2 (ref ?–2.0)

## 2016-06-10 ENCOUNTER — Telehealth: Payer: Self-pay

## 2016-06-10 NOTE — Telephone Encounter (Signed)
CMA call patient to inform lab results  Patient was aware and understood

## 2016-06-10 NOTE — Telephone Encounter (Signed)
-----   Message from Alfonse Spruce, [place] sent at 06/10/2016  4:13 AM EDT ----- -Lab showed no indication of anemia at this time. -To prevent anemia increase your dietary iron intake. Good sources of iron include dark green leafy vegetables, meats, beans, and iron fortified cereals.

## 2016-06-15 ENCOUNTER — Ambulatory Visit (HOSPITAL_COMMUNITY)
Admission: RE | Admit: 2016-06-15 | Discharge: 2016-06-15 | Disposition: A | Discharge status: Home / Self Care | Payer: BLUE CROSS/BLUE SHIELD | Source: Ambulatory Visit | Attending: Family Medicine | Admitting: Family Medicine

## 2016-06-15 DIAGNOSIS — D252 Subserosal leiomyoma of uterus: Secondary | ICD-10-CM | POA: Insufficient documentation

## 2016-06-15 DIAGNOSIS — N920 Excessive and frequent menstruation with regular cycle: Secondary | ICD-10-CM | POA: Diagnosis not present

## 2016-06-16 ENCOUNTER — Telehealth: Payer: Self-pay

## 2016-06-16 NOTE — Telephone Encounter (Signed)
Patient call CMA to find out results of her ultrasound that it was made yesterday on 06/15/2016  Patient stated that the radiology said she was going to get her results the same day   CMA told patient that she still haven't received the results from the provider & that [place] will call back as soon as she gets them

## 2016-06-18 ENCOUNTER — Telehealth: Payer: Self-pay

## 2016-06-18 NOTE — Telephone Encounter (Signed)
-----   Message from Alfonse Spruce, [place] sent at 06/18/2016  4:39 AM EDT ----- Ultrasound of abdomen and pelvis indicate you have two uterine fibroids measuring up to 6.5 cm which can cause heavy menstrual bleeding.  Ovaries normal. Follow up with your gynecology referral.

## 2016-06-18 NOTE — Telephone Encounter (Signed)
CMA call patient to let her know about her ultrasound  Patient Verify DOB  Patient was aware and understood

## 2016-06-19 ENCOUNTER — Ambulatory Visit: Payer: BLUE CROSS/BLUE SHIELD | Attending: Family Medicine | Admitting: *Deleted

## 2016-06-19 DIAGNOSIS — I10 Essential (primary) hypertension: Secondary | ICD-10-CM

## 2016-06-19 NOTE — Progress Notes (Signed)
Pt here for f/u BP check after office visit on 06/05/2016 with Fredia Beets, [place].  Pt denies chest pain, SOB, HA, new vison concerns, or generalized swelling.   She states she has been taking medications as prescribed. Blood pressure taken manually:  150/90 and 156/90  while patient is sitting.   Nurse visit will be routed to provider.Carilyn Goodpasture, RN, BSN

## 2016-06-26 ENCOUNTER — Telehealth: Payer: Self-pay | Admitting: Family Medicine

## 2016-06-26 NOTE — Telephone Encounter (Signed)
Writer called patient back who states that she was in for a BP check and her numbers were high.  She was waiting for someone to call her back and tell her what to do.  Then another person called her to tell her she had fibroids and again no one called her back.  Patient is feeling as if no one is listening to her. Writer has schedulers putting patient in with Dr. Jarold Song on Monday at 2:30.  Patient is aware.

## 2016-06-26 NOTE — Telephone Encounter (Signed)
PT called to speak with the nurse since her BP is very high and no one has call her back, please f/u with PT

## 2016-06-29 ENCOUNTER — Encounter: Payer: Self-pay | Admitting: Family Medicine

## 2016-06-29 ENCOUNTER — Ambulatory Visit: Payer: BLUE CROSS/BLUE SHIELD | Attending: Family Medicine | Admitting: Family Medicine

## 2016-06-29 DIAGNOSIS — Z880 Allergy status to penicillin: Secondary | ICD-10-CM | POA: Insufficient documentation

## 2016-06-29 DIAGNOSIS — N946 Dysmenorrhea, unspecified: Secondary | ICD-10-CM | POA: Insufficient documentation

## 2016-06-29 DIAGNOSIS — D259 Leiomyoma of uterus, unspecified: Secondary | ICD-10-CM | POA: Insufficient documentation

## 2016-06-29 DIAGNOSIS — I10 Essential (primary) hypertension: Secondary | ICD-10-CM

## 2016-06-29 DIAGNOSIS — F411 Generalized anxiety disorder: Secondary | ICD-10-CM | POA: Insufficient documentation

## 2016-06-29 DIAGNOSIS — D252 Subserosal leiomyoma of uterus: Secondary | ICD-10-CM | POA: Diagnosis not present

## 2016-06-29 DIAGNOSIS — Z79899 Other long term (current) drug therapy: Secondary | ICD-10-CM | POA: Insufficient documentation

## 2016-06-29 MED ORDER — CARVEDILOL 25 MG PO TABS: 25.0000 mg | ORAL_TABLET | Freq: Two times a day (BID) | ORAL | 3 refills | Status: DC

## 2016-06-29 NOTE — Patient Instructions (Signed)

## 2016-06-29 NOTE — Progress Notes (Signed)
Subjective:  Patient ID: Mary Mathis, female    DOB: 20-Jan-1976  Age: 41 y.o. MRN: [id number]  CC: Hypertension and Fibroids   HPI Mary Mathis  is a 41 year old female with a history of hypertension, generalized anxiety who presents today for follow-up visit. Her dose of lisinopril was increased at her last office visit with the nurse practitioner but her blood pressure is still elevated despite compliance.  She also adheres to a low-sodium diet and tries to exercise.  She takes clonidine at night for flushing which she had complained about and reports resolution of symptoms and is able to get a good night sleep.  She is yet to hear from GYN as a pelvic ultrasound revealed fibroids and she does have dysmenorrhea.  Past Medical History:  Diagnosis Date  . Anxiety   . Cholecystitis   . Hypertension     Past Surgical History:  Procedure Laterality Date  . CESAREAN SECTION     x2  . CHOLECYSTECTOMY  05/04/2011   Procedure: LAPAROSCOPIC CHOLECYSTECTOMY;  Surgeon: Harl Bowie, MD;  Location: [facility];  Service: General;  Laterality: N/A;  . INTRAOPERATIVE CHOLANGIOGRAM  05/04/2011   Procedure: INTRAOPERATIVE CHOLANGIOGRAM;  Surgeon: Harl Bowie, MD;  Location: [facility];  Service: General;  Laterality: N/A;  . TUBAL LIGATION     with last c-section    Allergies  Allergen Reactions  . Amoxicillin Swelling and Rash    Swelling of the ankles and hands  Has patient had a PCN reaction causing immediate rash, facial/tongue/throat swelling, SOB or lightheadedness with hypotension: Yes Has patient had a PCN reaction causing severe rash involving mucus membranes or skin necrosis: No Has patient had a PCN reaction that required hospitalization Yes Has patient had a PCN reaction occurring within the last 10 years: Yes If all of the above answers are "NO", then may proceed with Cephalosporin use.      Outpatient Medications Prior to Visit  Medication Sig Dispense  Refill  . busPIRone (BUSPAR) 7.5 MG tablet Take 1 tablet (7.5 mg total) by mouth 2 (two) times daily. 60 tablet 1  . cloNIDine (CATAPRES) 0.1 MG tablet Take 1 tablet (0.1 mg total) by mouth at bedtime. 30 tablet 3  . ibuprofen (ADVIL,MOTRIN) 800 MG tablet Take 1 tablet (800 mg total) by mouth every 8 (eight) hours as needed for moderate pain or cramping (Take with food.). 40 tablet 0  . lisinopril (PRINIVIL,ZESTRIL) 40 MG tablet Take 1 tablet (40 mg total) by mouth daily. 90 tablet 0  . Multiple Vitamin (MULTIVITAMIN WITH MINERALS) TABS tablet Take 1 tablet by mouth daily.    . carvedilol (COREG) 12.5 MG tablet Take 1 tablet (12.5 mg total) by mouth 2 (two) times daily with a meal. 60 tablet 3  . dicyclomine (BENTYL) 10 MG capsule Take 1 capsule (10 mg total) by mouth 3 (three) times daily before meals. (Patient not taking: Reported on 04/10/2016) 90 capsule 1  . meclizine (ANTIVERT) 12.5 MG tablet Take 1 tablet (12.5 mg total) by mouth 3 (three) times daily as needed for dizziness. (Patient not taking: Reported on 03/10/2016) 30 tablet 3  . omeprazole (PRILOSEC) 20 MG capsule Take 1 capsule (20 mg total) by mouth daily. (Patient not taking: Reported on 04/10/2016) 30 capsule 3   No facility-administered medications prior to visit.     ROS Review of Systems  Constitutional: Negative for activity change, appetite change and fatigue.  HENT: Negative for congestion, sinus pressure and sore throat.  Eyes: Negative for visual disturbance.  Respiratory: Negative for cough, chest tightness, shortness of breath and wheezing.   Cardiovascular: Negative for chest pain and palpitations.  Gastrointestinal: Negative for abdominal distention, abdominal pain and constipation.  Endocrine: Negative for polydipsia.  Genitourinary: Positive for menstrual problem. Negative for dysuria and frequency.  Musculoskeletal: Negative for arthralgias and back pain.  Skin: Negative for rash.  Neurological: Negative for  tremors, light-headedness and numbness.  Hematological: Does not bruise/bleed easily.  Psychiatric/Behavioral: Negative for agitation and behavioral problems.    Objective:  BP (!) 178/108 (BP Location: Left Arm, Cuff Size: Large)   Pulse 75   Temp 97.8 F (36.6 C) (Oral)   Ht '5\' 7"'  (1.702 m)   Wt (!) 336 lb (152.4 kg)   LMP 06/03/2016   SpO2 100%   BMI 52.63 kg/m   BP/Weight 06/29/2016 4/38/3818 4/0/3754  Systolic BP 360 677 034  Diastolic BP 035 90 94  Wt. (Lbs) 336 - 337.2  BMI 52.63 - 52.81      Physical Exam  Constitutional: She is oriented to person, place, and time. She appears well-developed and well-nourished.  Cardiovascular: Normal rate, normal heart sounds and intact distal pulses.   No murmur heard. Pulmonary/Chest: Effort normal and breath sounds normal. She has no wheezes. She has no rales. She exhibits no tenderness.  Abdominal: Soft. Bowel sounds are normal. She exhibits no distension and no mass. There is no tenderness.  Musculoskeletal: Normal range of motion.  Neurological: She is alert and oriented to person, place, and time.     Assessment & Plan:   1. Essential hypertension Uncontrolled Increased dose of carvedilol Low-sodium diet, weight loss - carvedilol (COREG) 25 MG tablet; Take 1 tablet (25 mg total) by mouth 2 (two) times daily with a meal.  Dispense: 60 tablet; Refill: 3 - CMP14+EGFR  2. Subserous leiomyoma of uterus Awaiting appointment from [place] ordered this encounter  Medications  . carvedilol (COREG) 25 MG tablet    Sig: Take 1 tablet (25 mg total) by mouth 2 (two) times daily with a meal.    Dispense:  60 tablet    Refill:  3    Discontinue previous dose    Follow-up: Return in about 1 month (around 07/29/2016) for follow up on Hypertension.   Arnoldo Morale MD

## 2016-06-30 LAB — CMP14+EGFR
A/G RATIO: 1.2 (ref 1.2–2.2)
ALK PHOS: 81 IU/L (ref 39–117)
ALT: 21 IU/L (ref 0–32)
AST: 25 IU/L (ref 0–40)
Albumin: 3.8 g/dL (ref 3.5–5.5)
BUN/Creatinine Ratio: 9 (ref 9–23)
BUN: 9 mg/dL (ref 6–20)
Bilirubin Total: <0.2 mg/dL (ref 0.0–1.2)
CALCIUM: 8.9 mg/dL (ref 8.7–10.2)
CHLORIDE: 103 mmol/L (ref 96–106)
CO2: 24 mmol/L (ref 18–29)
Creatinine, Ser: 0.96 mg/dL (ref 0.57–1.00)
GFR calc Af Amer: 86 mL/min/{1.73_m2} (ref >59)
GFR, EST NON AFRICAN AMERICAN: 75 mL/min/{1.73_m2} (ref >59)
GLOBULIN, TOTAL: 3.3 g/dL (ref 1.5–4.5)
Glucose: 91 mg/dL (ref 65–99)
Potassium: 4.4 mmol/L (ref 3.5–5.2)
SODIUM: 141 mmol/L (ref 134–144)
Total Protein: 7.1 g/dL (ref 6.0–8.5)

## 2016-07-01 ENCOUNTER — Telehealth: Payer: Self-pay | Admitting: *Deleted

## 2016-07-01 NOTE — Telephone Encounter (Signed)
DOB verified by Pt  Normal Lab results given. Pt verbalized understanding

## 2016-07-07 ENCOUNTER — Ambulatory Visit: Payer: BLUE CROSS/BLUE SHIELD | Admitting: Family Medicine

## 2016-07-13 ENCOUNTER — Encounter: Payer: Self-pay | Admitting: Family Medicine

## 2016-07-13 ENCOUNTER — Ambulatory Visit: Payer: BLUE CROSS/BLUE SHIELD | Attending: Family Medicine | Admitting: Family Medicine

## 2016-07-13 VITALS — BP 154/87 | HR 75

## 2016-07-13 DIAGNOSIS — F411 Generalized anxiety disorder: Secondary | ICD-10-CM | POA: Insufficient documentation

## 2016-07-13 DIAGNOSIS — Z880 Allergy status to penicillin: Secondary | ICD-10-CM | POA: Diagnosis not present

## 2016-07-13 DIAGNOSIS — Z79899 Other long term (current) drug therapy: Secondary | ICD-10-CM | POA: Insufficient documentation

## 2016-07-13 DIAGNOSIS — I10 Essential (primary) hypertension: Secondary | ICD-10-CM | POA: Insufficient documentation

## 2016-07-13 MED ORDER — AMLODIPINE BESYLATE 10 MG PO TABS: 10.0000 mg | ORAL_TABLET | Freq: Every day | ORAL | 3 refills | Status: DC

## 2016-07-13 MED FILL — AMLODIPINE BESYLATE 10 MG T: 10 | 30 days supply | Qty: 30 | Fill #0

## 2016-07-13 NOTE — Patient Instructions (Signed)

## 2016-07-13 NOTE — Progress Notes (Signed)
Subjective:  Patient ID: Mary Mathis, female    DOB: March 14, 1975  Age: 41 y.o. MRN: [id number]  CC: Follow-up  HPI Mary Mathis is a 41 year old female with a history of hypertension, generalized anxiety who presents today for a follow-up visit. She had presented for a nurse visit and was found to have an elevated blood pressure and so was transferred to my schedule. She informs me that at her visit with the dentist her systolic blood pressure was in the 170s.  Carvedilol was increased from 12.5 mg to 25 mg twice daily at her last visit and she has been compliant with this dose as well as her lisinopril 40 mg and also exercise. She endorses intermittent headaches but no blurry vision, nausea or vomiting.  Past Medical History:  Diagnosis Date  . Anxiety   . Cholecystitis   . Hypertension     Past Surgical History:  Procedure Laterality Date  . CESAREAN SECTION     x2  . CHOLECYSTECTOMY  05/04/2011   Procedure: LAPAROSCOPIC CHOLECYSTECTOMY;  Surgeon: Harl Bowie, MD;  Location: [facility];  Service: General;  Laterality: N/A;  . INTRAOPERATIVE CHOLANGIOGRAM  05/04/2011   Procedure: INTRAOPERATIVE CHOLANGIOGRAM;  Surgeon: Harl Bowie, MD;  Location: [facility];  Service: General;  Laterality: N/A;  . TUBAL LIGATION     with last c-section    Allergies  Allergen Reactions  . Amoxicillin Swelling and Rash    Swelling of the ankles and hands  Has patient had a PCN reaction causing immediate rash, facial/tongue/throat swelling, SOB or lightheadedness with hypotension: Yes Has patient had a PCN reaction causing severe rash involving mucus membranes or skin necrosis: No Has patient had a PCN reaction that required hospitalization Yes Has patient had a PCN reaction occurring within the last 10 years: Yes If all of the above answers are "NO", then may proceed with Cephalosporin use.      Outpatient Medications Prior to Visit  Medication Sig Dispense Refill  .  busPIRone (BUSPAR) 7.5 MG tablet Take 1 tablet (7.5 mg total) by mouth 2 (two) times daily. 60 tablet 1  . carvedilol (COREG) 25 MG tablet Take 1 tablet (25 mg total) by mouth 2 (two) times daily with a meal. 60 tablet 3  . cloNIDine (CATAPRES) 0.1 MG tablet Take 1 tablet (0.1 mg total) by mouth at bedtime. 30 tablet 3  . ibuprofen (ADVIL,MOTRIN) 800 MG tablet Take 1 tablet (800 mg total) by mouth every 8 (eight) hours as needed for moderate pain or cramping (Take with food.). 40 tablet 0  . lisinopril (PRINIVIL,ZESTRIL) 40 MG tablet Take 1 tablet (40 mg total) by mouth daily. 90 tablet 0  . Multiple Vitamin (MULTIVITAMIN WITH MINERALS) TABS tablet Take 1 tablet by mouth daily.    Marland Kitchen dicyclomine (BENTYL) 10 MG capsule Take 1 capsule (10 mg total) by mouth 3 (three) times daily before meals. (Patient not taking: Reported on 04/10/2016) 90 capsule 1  . meclizine (ANTIVERT) 12.5 MG tablet Take 1 tablet (12.5 mg total) by mouth 3 (three) times daily as needed for dizziness. (Patient not taking: Reported on 03/10/2016) 30 tablet 3  . omeprazole (PRILOSEC) 20 MG capsule Take 1 capsule (20 mg total) by mouth daily. (Patient not taking: Reported on 04/10/2016) 30 capsule 3   No facility-administered medications prior to visit.     ROS Review of Systems  Constitutional: Negative for activity change, appetite change and fatigue.  HENT: Negative for congestion, sinus pressure and sore  throat.   Eyes: Negative for visual disturbance.  Respiratory: Negative for cough, chest tightness, shortness of breath and wheezing.   Cardiovascular: Negative for chest pain and palpitations.  Gastrointestinal: Negative for abdominal distention, abdominal pain and constipation.  Endocrine: Negative for polydipsia.  Genitourinary: Negative for dysuria and frequency.  Musculoskeletal: Negative for arthralgias and back pain.  Skin: Negative for rash.  Neurological: Positive for headaches. Negative for tremors, light-headedness  and numbness.  Hematological: Does not bruise/bleed easily.  Psychiatric/Behavioral: Negative for agitation and behavioral problems.    Objective:  BP (!) 154/87 (BP Location: Right Arm, Patient Position: Sitting, Cuff Size: Large)   Pulse 75   BP/Weight 07/13/2016 06/29/2016 9/44/9675  Systolic BP 916 384 665  Diastolic BP 87 993 90  Wt. (Lbs) - 336 -  BMI - 52.63 -      Physical Exam  Constitutional: She is oriented to person, place, and time. She appears well-developed and well-nourished.  Obese  Cardiovascular: Normal rate, normal heart sounds and intact distal pulses.   No murmur heard. Pulmonary/Chest: Effort normal and breath sounds normal. She has no wheezes. She has no rales. She exhibits no tenderness.  Abdominal: Soft. Bowel sounds are normal. She exhibits no distension and no mass. There is no tenderness.  Musculoskeletal: Normal range of motion.  Neurological: She is alert and oriented to person, place, and time.     Assessment & Plan:   1. Essential hypertension Blood pressure is uncontrolled - manual BP is 150/100 Amlodipine added to regimen Continue Coreg, Lisinopril Low sodium  - amLODipine (NORVASC) 10 MG tablet; Take 1 tablet (10 mg total) by mouth daily.  Dispense: 30 tablet; Refill: 3   Meds ordered this encounter  Medications  . amLODipine (NORVASC) 10 MG tablet    Sig: Take 1 tablet (10 mg total) by mouth daily.    Dispense:  30 tablet    Refill:  3    Follow-up: Return in about 3 weeks (around 07/31/2016) for follow up of Hypertension.   Arnoldo Morale MD

## 2016-07-20 ENCOUNTER — Emergency Department (HOSPITAL_COMMUNITY)
Admission: EM | Admit: 2016-07-20 | Discharge: 2016-07-20 | Disposition: A | Discharge status: Home / Self Care | Payer: BLUE CROSS/BLUE SHIELD | Attending: Emergency Medicine | Admitting: Emergency Medicine

## 2016-07-20 ENCOUNTER — Encounter (HOSPITAL_COMMUNITY): Payer: Self-pay

## 2016-07-20 DIAGNOSIS — F419 Anxiety disorder, unspecified: Secondary | ICD-10-CM | POA: Diagnosis not present

## 2016-07-20 DIAGNOSIS — Z79899 Other long term (current) drug therapy: Secondary | ICD-10-CM | POA: Insufficient documentation

## 2016-07-20 DIAGNOSIS — I10 Essential (primary) hypertension: Secondary | ICD-10-CM | POA: Diagnosis not present

## 2016-07-20 DIAGNOSIS — F410 Panic disorder [episodic paroxysmal anxiety] without agoraphobia: Secondary | ICD-10-CM | POA: Diagnosis present

## 2016-07-20 NOTE — ED Triage Notes (Signed)
States took busperione 2200 pm last night and woke up hard to swallow and cold chills, no drooling or stridor noted states she feel like she is having shortness of breath able to speak in full sentences.

## 2016-07-20 NOTE — Discharge Instructions (Signed)
Please read attached information. If you experience any new or worsening signs or symptoms please return to the emergency room for evaluation. Please follow-up with your primary care provider or specialist as discussed. Please inform your primary care provider of today's visit and all relevant data.

## 2016-07-20 NOTE — ED Provider Notes (Signed)
[name] DEPT Provider Note   CSN: [id number] Arrival date & time: 07/20/16  0138     History   Chief Complaint Chief Complaint  Patient presents with  . Allergic Reaction    HPI Mary Mathis is a 41 y.o. female.  HPI   41 year old female presents today with complaints of panic attack. Patient notes that she has a history of anxiety, and has been using BuSpar for this. She notes 2 nights ago she was having significant anxiety, take deep BuSpar and felt relief of her symptoms. She notes similar symptoms last night before going to bed, took her medication and fell asleep. She reports waking up in the middle the night in a panic state reporting that she could not swallow could not hear and was severely anxious. Patient and she was short of breath at that time and drove here to the emergency room. Upon my assessment patient reports that she is no longer having no symptoms, still feels the anxiety symptoms that she felt before going to bed.  Patient denies any intraoral swelling, tongue swelling, facial swelling, rash, chest pain or shortness of breath, abdominal pain, or any other signs of significant reaction. Patient notes she's taking blood pressure medication including lisinopril, clonidine, carvedilol and has been on these for prolonged period of time.  Past Medical History:  Diagnosis Date  . Anxiety   . Cholecystitis   . Hypertension     Patient Active Problem List   Diagnosis Date Noted  . Uterine fibroid 06/29/2016  . Anxiety 03/10/2016  . ASCUS of cervix with negative high risk HPV 02/11/2016  . Dysmenorrhea 01/20/2016  . Menorrhagia 01/10/2016  . Benign paroxysmal positional vertigo 01/10/2016  . Essential hypertension 07/06/2014  . Obesity 07/06/2014  . Pedal edema 07/06/2014    Past Surgical History:  Procedure Laterality Date  . CESAREAN SECTION     x2  . CHOLECYSTECTOMY  05/04/2011   Procedure: LAPAROSCOPIC CHOLECYSTECTOMY;  Surgeon: Harl Bowie, MD;  Location: [facility];  Service: General;  Laterality: N/A;  . INTRAOPERATIVE CHOLANGIOGRAM  05/04/2011   Procedure: INTRAOPERATIVE CHOLANGIOGRAM;  Surgeon: Harl Bowie, MD;  Location: [facility];  Service: General;  Laterality: N/A;  . TUBAL LIGATION     with last c-section    OB History    Gravida Para Term Preterm AB Living   4 2 2   2 2    SAB TAB Ectopic Multiple Live Births                   Home Medications    Prior to Admission medications   Medication Sig Start Date End Date Taking? Authorizing Provider  amLODipine (NORVASC) 10 MG tablet Take 1 tablet (10 mg total) by mouth daily. 07/13/16  Yes Arnoldo Morale, MD  busPIRone (BUSPAR) 7.5 MG tablet Take 1 tablet (7.5 mg total) by mouth 2 (two) times daily. 03/10/16  Yes Arnoldo Morale, MD  carvedilol (COREG) 25 MG tablet Take 1 tablet (25 mg total) by mouth 2 (two) times daily with a meal. 06/29/16  Yes Amao, Enobong, MD  cloNIDine (CATAPRES) 0.1 MG tablet Take 1 tablet (0.1 mg total) by mouth at bedtime. 04/10/16  Yes Arnoldo Morale, MD  lisinopril (PRINIVIL,ZESTRIL) 40 MG tablet Take 1 tablet (40 mg total) by mouth daily. Patient taking differently: Take 40 mg by mouth every morning.  06/05/16  Yes Hairston, Maylon Peppers, FNP  Multiple Vitamin (MULTIVITAMIN WITH MINERALS) TABS tablet Take 1 tablet by mouth daily.  Yes [provider]  dicyclomine (BENTYL) 10 MG capsule Take 1 capsule (10 mg total) by mouth 3 (three) times daily before meals. Patient not taking: Reported on 04/10/2016 03/10/16   Arnoldo Morale, MD  ibuprofen (ADVIL,MOTRIN) 800 MG tablet Take 1 tablet (800 mg total) by mouth every 8 (eight) hours as needed for moderate pain or cramping (Take with food.). Patient not taking: Reported on 07/20/2016 06/05/16   Alfonse Spruce, FNP  meclizine (ANTIVERT) 12.5 MG tablet Take 1 tablet (12.5 mg total) by mouth 3 (three) times daily as needed for dizziness. Patient not taking: Reported on 03/10/2016 12/25/15   Brayton Caves, PA-C  omeprazole (PRILOSEC) 20 MG capsule Take 1 capsule (20 mg total) by mouth daily. Patient not taking: Reported on 04/10/2016 03/10/16   Arnoldo Morale, MD    Family History Family History  Problem Relation Age of Onset  . Hypertension Mother   . Diabetes Father   . Hypertension Father     Social History Social History  Substance Use Topics  . Smoking status: Never Smoker  . Smokeless tobacco: Never Used  . Alcohol use No     Allergies   Amoxicillin   Review of Systems Review of Systems  All other systems reviewed and are negative.    Physical Exam Updated Vital Signs BP (!) 130/107 (BP Location: Left Arm)   Pulse 86   Temp 98.3 F (36.8 C) (Oral)   Resp 20   SpO2 100%   Physical Exam  Constitutional: She is oriented to person, place, and time. She appears well-developed and well-nourished.  HENT:  Head: Normocephalic and atraumatic.  Eyes: Conjunctivae are normal. Pupils are equal, round, and reactive to light. Right eye exhibits no discharge. Left eye exhibits no discharge. No scleral icterus.  Neck: Normal range of motion. No JVD present. No tracheal deviation present.  Pulmonary/Chest: Effort normal. No stridor.  Neurological: She is alert and oriented to person, place, and time. Coordination normal.  Psychiatric: She has a normal mood and affect. Her behavior is normal. Judgment and thought content normal.  Nursing note and vitals reviewed.   ED Treatments / Results  Labs (all labs ordered are listed, but only abnormal results are displayed) Labs Reviewed - No data to display  EKG  EKG Interpretation None       Radiology No results found.  Procedures Procedures (including critical care time)  Medications Ordered in ED Medications - No data to display   Initial Impression / Assessment and Plan / ED Course  I have reviewed the triage vital signs and the nursing notes.  Pertinent labs & imaging results that were available  during my care of the patient were reviewed by me and considered in my medical decision making (see chart for details).      Final Clinical Impressions(s) / ED Diagnoses   Final diagnoses:  Anxiety   Assessment/Plan: Patient's presentation is most consistent with anxiety. I have low suspicion for significant medication reaction. For my evaluation it appears patient woke up abruptly from sleep and had acute anxiety. She is very well-appearing with resolution of symptoms throughout my evaluation. She has no signs of airway compromise or any other potential life-threatening etiology. This does not appear to be an allergic reaction to her medication. She will follow up with her primary care or ongoing management, she is given strict impressions. She verbalized understanding and agreement to today's plan had no further questions or concerns at time of discharge  New Prescriptions New Prescriptions   No medications on file     Francee Gentile 07/20/16 1583    Ripley Fraise, MD 07/20/16 [phone]

## 2016-07-21 ENCOUNTER — Other Ambulatory Visit: Payer: Self-pay | Admitting: Family Medicine

## 2016-07-21 DIAGNOSIS — F419 Anxiety disorder, unspecified: Secondary | ICD-10-CM

## 2016-07-30 ENCOUNTER — Other Ambulatory Visit: Payer: Self-pay | Admitting: Family Medicine

## 2016-07-30 ENCOUNTER — Ambulatory Visit (INDEPENDENT_AMBULATORY_CARE_PROVIDER_SITE_OTHER): Payer: BLUE CROSS/BLUE SHIELD | Admitting: Family Medicine

## 2016-07-30 ENCOUNTER — Encounter: Payer: BLUE CROSS/BLUE SHIELD | Admitting: Obstetrics & Gynecology

## 2016-07-30 VITALS — BP 135/81 | HR 72 | Wt 326.1 lb

## 2016-07-30 DIAGNOSIS — N920 Excessive and frequent menstruation with regular cycle: Secondary | ICD-10-CM

## 2016-07-30 DIAGNOSIS — N946 Dysmenorrhea, unspecified: Secondary | ICD-10-CM | POA: Diagnosis not present

## 2016-07-30 DIAGNOSIS — D252 Subserosal leiomyoma of uterus: Secondary | ICD-10-CM

## 2016-07-30 DIAGNOSIS — D250 Submucous leiomyoma of uterus: Secondary | ICD-10-CM

## 2016-07-30 NOTE — Assessment & Plan Note (Signed)
No evidence of anemia, despite her perceived heavy bleeding

## 2016-07-30 NOTE — Patient Instructions (Signed)
Pelvic Mass A pelvic mass is an abnormal growth in the pelvis. The pelvis is the area between your hip bones. It includes the bladder and the rectum in males and females, and also the uterus and ovaries in females. What are the causes? Many things can cause a pelvic mass, including:  Cancer.  Fibroids of the uterus.  Ovarian cysts.  Infection.  Ectopic pregnancy.  What are the signs or symptoms? Symptoms of a pelvic mass may include:  Cramping.  Nausea.  Diarrhea.  Fever.  Vomiting.  Weakness.  Pain in the pelvis, side, or back.  Weight loss.  Constipation.  Problems with vaginal bleeding, including: ? Light or heavy bleeding with or without blood clots. ? Irregular menstruation. ? Pain with menstruation.  Problems with urination, including: ? Frequent urination. ? Inability to empty the bladder completely. ? Urinating very small amounts. ? Pain with urination. ? Bloody urine.  Some pelvic masses do not cause symptoms. How is this diagnosed? To make a diagnosis, your health care provider will need to learn more about the mass. You may have tests or procedures done, such as:  Blood tests.  X-rays.  Ultrasound.  CT scan.  MRI.  A surgery to look inside of your abdomen with cameras (laparoscopy).  A biopsy that is performed with a needle or during laparoscopy or surgery.  In some cases, what seemed like a pelvic mass may actually be something else, such as a mass in one of the organs that are near the pelvis, an infection (abscess) or scar tissue (adhesions) that formed after a surgery. How is this treated? Treatment will depend on the cause of the mass. Follow these instructions at home: What you need to do at home will depend on the cause of the mass. Follow the instructions that your health care provider gives to you. In general:  Keep all follow-up visits as directed by your health care provider. This is important.  Take medicines only as  directed by your health care provider.  Follow any restrictions that are given to you by your health care provider.  Contact a health care provider if:  You develop new symptoms. Get help right away if:  You vomit bright red blood or vomit material that looks like coffee grounds.  You have blood in your stools, or the stools turn black and tarry.  You have an abnormal or increased amount of vaginal bleeding.  You have a fever.  You develop easy bruising or bleeding.  You develop sudden or worsening pain that is not controlled by your medicine.  You feel worsening weakness, or you have a fainting episode.  You feel that the mass has suddenly gotten larger.  You develop severe bloating in your abdomen or your pelvis.  You cannot pass any urine.  You are unable to have a bowel movement. This information is not intended to replace advice given to you by your health care provider. Make sure you discuss any questions you have with your health care provider. Document Released: 05/26/2006 Document Revised: 07/25/2015 Document Reviewed: 10/02/2013 Elsevier Interactive Patient Education  2018 [employer] Inc[employer]  

## 2016-07-30 NOTE — Assessment & Plan Note (Signed)
?   If this is causing all of her symptoms--has also been diagnosed with panic attacks--she is very obese and has 2 prior C-sections--would opt for minimally invasive options--after careful consideration of all options, risks and benefits-->she elects for UFE.-will refer her.

## 2016-07-30 NOTE — Progress Notes (Signed)
   Subjective:    Patient ID: Mary Mathis is a 42 y.o. female presenting with No chief complaint on file.  on 07/30/2016  HPI: Seen previously in 11/17 for dysmenorrhea and menorrhagia. Has been to the ED several times with feeling dizzy and SOB. Outpatient u/s noted to have fibroids and 17 wk size uterus.  Notes periods are 4-5 days and 2nd day is very heavy. Has some clots. Has regular cycles that are real heavy. Notes that she has LLQ pain with her cycles. Her last Hgb is 11.7.  Review of Systems  Constitutional: Negative for chills and fever.  Respiratory: Positive for shortness of breath.   Cardiovascular: Negative for chest pain.  Gastrointestinal: Negative for abdominal pain, nausea and vomiting.  Genitourinary: Positive for menstrual problem and pelvic pain. Negative for dysuria.  Skin: Negative for rash.  Neurological: Positive for dizziness.      Objective:    BP 135/81   Pulse 72   Wt (!) 326 lb 1.6 oz (147.9 kg)   LMP 07/27/2016   BMI 51.07 kg/m  Physical Exam  Constitutional: She is oriented to person, place, and time. She appears well-developed and well-nourished. No distress.  HENT:  Head: Normocephalic and atraumatic.  Eyes: No scleral icterus.  Neck: Neck supple.  Cardiovascular: Normal rate.   Pulmonary/Chest: Effort normal.  Abdominal: Soft.  Neurological: She is alert and oriented to person, place, and time.  Skin: Skin is warm and dry.  Psychiatric: She has a normal mood and affect.        Assessment & Plan:   Problem List Items Addressed This Visit      Unprioritized   Menorrhagia    No evidence of anemia, despite her perceived heavy bleeding      Dysmenorrhea - Primary    Hopefully will improve with UFE.      Uterine fibroid    ? If this is causing all of her symptoms--has also been diagnosed with panic attacks--she is very obese and has 2 prior C-sections--would opt for minimally invasive options--after careful consideration of  all options, risks and benefits-->she elects for UFE.-will refer her.      Relevant Orders   Ambulatory referral to Interventional Radiology      Total face-to-face time with patient: 15 minutes. Over 50% of encounter was spent on counseling and coordination of care. Return in about 3 months (around 10/30/2016).  Donnamae Jude 07/30/2016 2:29 PM

## 2016-07-30 NOTE — Assessment & Plan Note (Signed)
Hopefully will improve with UFE.

## 2016-07-31 ENCOUNTER — Encounter: Payer: Self-pay | Admitting: Family Medicine

## 2016-07-31 ENCOUNTER — Ambulatory Visit: Payer: BLUE CROSS/BLUE SHIELD | Attending: Family Medicine | Admitting: Family Medicine

## 2016-07-31 VITALS — BP 134/81 | HR 82 | Temp 98.0°F | Resp 20 | Wt 300.8 lb

## 2016-07-31 DIAGNOSIS — R232 Flushing: Secondary | ICD-10-CM | POA: Diagnosis not present

## 2016-07-31 DIAGNOSIS — F410 Panic disorder [episodic paroxysmal anxiety] without agoraphobia: Secondary | ICD-10-CM | POA: Diagnosis not present

## 2016-07-31 DIAGNOSIS — F411 Generalized anxiety disorder: Secondary | ICD-10-CM | POA: Diagnosis present

## 2016-07-31 DIAGNOSIS — R102 Pelvic and perineal pain: Secondary | ICD-10-CM | POA: Insufficient documentation

## 2016-07-31 DIAGNOSIS — Z6841 Body Mass Index (BMI) 40.0 and over, adult: Secondary | ICD-10-CM | POA: Diagnosis not present

## 2016-07-31 DIAGNOSIS — E66813 Obesity, class 3: Secondary | ICD-10-CM

## 2016-07-31 DIAGNOSIS — I10 Essential (primary) hypertension: Secondary | ICD-10-CM | POA: Diagnosis not present

## 2016-07-31 DIAGNOSIS — D259 Leiomyoma of uterus, unspecified: Secondary | ICD-10-CM | POA: Diagnosis present

## 2016-07-31 DIAGNOSIS — F419 Anxiety disorder, unspecified: Secondary | ICD-10-CM

## 2016-07-31 DIAGNOSIS — Z880 Allergy status to penicillin: Secondary | ICD-10-CM | POA: Insufficient documentation

## 2016-07-31 DIAGNOSIS — Z9049 Acquired absence of other specified parts of digestive tract: Secondary | ICD-10-CM | POA: Diagnosis not present

## 2016-07-31 MED ORDER — CLONIDINE HCL 0.1 MG PO TABS: 0.1000 mg | ORAL_TABLET | Freq: Every day | ORAL | 3 refills | Status: DC

## 2016-07-31 MED ORDER — LISINOPRIL 40 MG PO TABS: 40.0000 mg | ORAL_TABLET | ORAL | 3 refills | Status: DC

## 2016-07-31 MED ORDER — CARVEDILOL 25 MG PO TABS: 25.0000 mg | ORAL_TABLET | Freq: Two times a day (BID) | ORAL | 3 refills | Status: DC

## 2016-07-31 MED ORDER — BUSPIRONE HCL 7.5 MG PO TABS: 7.5000 mg | ORAL_TABLET | Freq: Two times a day (BID) | ORAL | 3 refills | Status: DC

## 2016-07-31 NOTE — Patient Instructions (Signed)

## 2016-07-31 NOTE — Progress Notes (Signed)
Subjective:  Patient ID: Mary Mathis, female    DOB: 09-Dec-1975  Age: 41 y.o. MRN: [id number]  CC: Hypertension; Fibroids; and Anxiety   HPI Mary Mathis  is a 41 year old female with a history of hypertension, generalized anxiety who presents today for a follow-up of hypertension after amlodipine had been added to her regimen at her last office visit and her blood pressure is controlled today. She exercises and is working on losing weight.  She was seen by GYN yesterday due to the presence of uterine fibroids with plans for uterine fibroid embolization and she has an upcoming appointment next week. She endorses intermittent pelvic pain which comes with the onset of her periods but is absent at this time.  She had an ED visit 10 days ago after she woke up with shortness of breath from her sleep associated with trouble swallowing. This was diagnosed as a panic attack and she was advised to continue with her Buspar. She denies repeat of symptoms and does not snore when she sleeps; she denies daytime somnolence or headaches. We have discussed that in the event of symptoms return workup for obstructive sleep apnea would be a consideration.   Past Medical History:  Diagnosis Date  . Anxiety   . Cholecystitis   . Hypertension     Past Surgical History:  Procedure Laterality Date  . CESAREAN SECTION     x2  . CHOLECYSTECTOMY  05/04/2011   Procedure: LAPAROSCOPIC CHOLECYSTECTOMY;  Surgeon: Harl Bowie, MD;  Location: [facility];  Service: General;  Laterality: N/A;  . INTRAOPERATIVE CHOLANGIOGRAM  05/04/2011   Procedure: INTRAOPERATIVE CHOLANGIOGRAM;  Surgeon: Harl Bowie, MD;  Location: [facility];  Service: General;  Laterality: N/A;  . TUBAL LIGATION     with last c-section    Allergies  Allergen Reactions  . Amoxicillin Swelling and Rash    Swelling of the ankles and hands  Has patient had a PCN reaction causing immediate rash, facial/tongue/throat swelling, SOB or  lightheadedness with hypotension: Yes Has patient had a PCN reaction causing severe rash involving mucus membranes or skin necrosis: No Has patient had a PCN reaction that required hospitalization Yes Has patient had a PCN reaction occurring within the last 10 years: Yes If all of the above answers are "NO", then may proceed with Cephalosporin use.      Outpatient Medications Prior to Visit  Medication Sig Dispense Refill  . amLODipine (NORVASC) 10 MG tablet Take 1 tablet (10 mg total) by mouth daily. 30 tablet 3  . ibuprofen (ADVIL,MOTRIN) 800 MG tablet Take 1 tablet (800 mg total) by mouth every 8 (eight) hours as needed for moderate pain or cramping (Take with food.). 40 tablet 0  . Multiple Vitamin (MULTIVITAMIN WITH MINERALS) TABS tablet Take 1 tablet by mouth daily.    . busPIRone (BUSPAR) 7.5 MG tablet TAKE 1 TABLET (7.5 MG TOTAL) BY MOUTH 2 (TWO) TIMES DAILY. 60 tablet 0  . carvedilol (COREG) 25 MG tablet Take 1 tablet (25 mg total) by mouth 2 (two) times daily with a meal. 60 tablet 3  . cloNIDine (CATAPRES) 0.1 MG tablet Take 1 tablet (0.1 mg total) by mouth at bedtime. 30 tablet 3  . lisinopril (PRINIVIL,ZESTRIL) 40 MG tablet Take 1 tablet (40 mg total) by mouth daily. (Patient taking differently: Take 40 mg by mouth every morning. ) 90 tablet 0  . dicyclomine (BENTYL) 10 MG capsule Take 1 capsule (10 mg total) by mouth 3 (three) times daily  before meals. (Patient not taking: Reported on 04/10/2016) 90 capsule 1  . meclizine (ANTIVERT) 12.5 MG tablet Take 1 tablet (12.5 mg total) by mouth 3 (three) times daily as needed for dizziness. (Patient not taking: Reported on 03/10/2016) 30 tablet 3  . omeprazole (PRILOSEC) 20 MG capsule Take 1 capsule (20 mg total) by mouth daily. (Patient not taking: Reported on 07/30/2016) 30 capsule 3   No facility-administered medications prior to visit.     ROS Review of Systems  Constitutional: Negative for activity change, appetite change and fatigue.   HENT: Negative for congestion, sinus pressure and sore throat.   Eyes: Negative for visual disturbance.  Respiratory: Negative for cough, chest tightness, shortness of breath and wheezing.   Cardiovascular: Negative for chest pain and palpitations.  Gastrointestinal: Negative for abdominal distention, abdominal pain and constipation.  Endocrine: Negative for polydipsia.  Genitourinary: Negative for dysuria and frequency.  Musculoskeletal: Negative for arthralgias and back pain.  Skin: Negative for rash.  Neurological: Negative for tremors, light-headedness and numbness.  Hematological: Does not bruise/bleed easily.  Psychiatric/Behavioral: Negative for agitation and behavioral problems.    Objective:  BP 134/81 (BP Location: Right Arm, Patient Position: Sitting, Cuff Size: Large)   Pulse 82   Temp 98 F (36.7 C) (Oral)   Resp 20   Wt (!) 300 lb 12.8 oz (136.4 kg)   LMP 07/27/2016   SpO2 99%   BMI 47.11 kg/m   BP/Weight 07/31/2016 07/30/2016 3/50/0938  Systolic BP 182 993 716  Diastolic BP 81 81 967  Wt. (Lbs) 300.8 326.1 -  BMI 47.11 51.07 -      Physical Exam  Constitutional: She is oriented to person, place, and time. She appears well-developed and well-nourished.  Cardiovascular: Normal rate, normal heart sounds and intact distal pulses.   No murmur heard. Pulmonary/Chest: Effort normal and breath sounds normal. She has no wheezes. She has no rales. She exhibits no tenderness.  Abdominal: Soft. Bowel sounds are normal. She exhibits no distension and no mass. There is no tenderness.  Musculoskeletal: Normal range of motion.  Neurological: She is alert and oriented to person, place, and time.     Assessment & Plan:   1. Anxiety Stable - busPIRone (BUSPAR) 7.5 MG tablet; Take 1 tablet (7.5 mg total) by mouth 2 (two) times daily.  Dispense: 60 tablet; Refill: 3  2. Essential hypertension Controlled Low-sodium diet and Weight loss will help tremendously Fasting  labs at next visit - lisinopril (PRINIVIL,ZESTRIL) 40 MG tablet; Take 1 tablet (40 mg total) by mouth every morning.  Dispense: 30 tablet; Refill: 3 - carvedilol (COREG) 25 MG tablet; Take 1 tablet (25 mg total) by mouth 2 (two) times daily with a meal.  Dispense: 60 tablet; Refill: 3  3. [place] reaction Controlled - cloNIDine (CATAPRES) 0.1 MG tablet; Take 1 tablet (0.1 mg total) by mouth at bedtime.  Dispense: 30 tablet; Refill: 3  4. Class 3 severe obesity due to excess calories with serious comorbidity and body mass index (BMI) of 45.0 to 49.9 in adult [facility]) Commended on recent weight loss Continue to reduce portion sizes and increase physical activity   Meds ordered this encounter  Medications  . DISCONTD: busPIRone (BUSPAR) 7.5 MG tablet    Sig: Take 1 tablet (7.5 mg total) by mouth 2 (two) times daily.    Dispense:  60 tablet    Refill:  3  . DISCONTD: carvedilol (COREG) 25 MG tablet    Sig: Take 1 tablet (25 mg  total) by mouth 2 (two) times daily with a meal.    Dispense:  60 tablet    Refill:  3    Discontinue previous dose  . DISCONTD: cloNIDine (CATAPRES) 0.1 MG tablet    Sig: Take 1 tablet (0.1 mg total) by mouth at bedtime.    Dispense:  30 tablet    Refill:  3  . DISCONTD: lisinopril (PRINIVIL,ZESTRIL) 40 MG tablet    Sig: Take 1 tablet (40 mg total) by mouth every morning.    Dispense:  30 tablet    Refill:  3    Discontinue previous dose  . busPIRone (BUSPAR) 7.5 MG tablet    Sig: Take 1 tablet (7.5 mg total) by mouth 2 (two) times daily.    Dispense:  60 tablet    Refill:  3  . lisinopril (PRINIVIL,ZESTRIL) 40 MG tablet    Sig: Take 1 tablet (40 mg total) by mouth every morning.    Dispense:  30 tablet    Refill:  3    Discontinue previous dose  . cloNIDine (CATAPRES) 0.1 MG tablet    Sig: Take 1 tablet (0.1 mg total) by mouth at bedtime.    Dispense:  30 tablet    Refill:  3  . carvedilol (COREG) 25 MG tablet    Sig: Take 1 tablet (25 mg total) by  mouth 2 (two) times daily with a meal.    Dispense:  60 tablet    Refill:  3    Discontinue previous dose    Follow-up: Return in about 3 months (around 10/31/2016) for Follow-up on hypertension.    This note has been created with Surveyor, quantity. Any transcriptional errors are unintentional.     Arnoldo Morale MD

## 2016-08-11 ENCOUNTER — Ambulatory Visit
Admission: RE | Admit: 2016-08-11 | Discharge: 2016-08-11 | Disposition: A | Discharge status: Home / Self Care | Payer: BLUE CROSS/BLUE SHIELD | Source: Ambulatory Visit | Attending: Family Medicine | Admitting: Family Medicine

## 2016-08-11 ENCOUNTER — Other Ambulatory Visit: Payer: Self-pay | Admitting: Family Medicine

## 2016-08-11 DIAGNOSIS — D250 Submucous leiomyoma of uterus: Secondary | ICD-10-CM

## 2016-08-11 HISTORY — DX: Benign neoplasm of connective and other soft tissue, unspecified: D21.9

## 2016-08-11 HISTORY — PX: IR RADIOLOGIST EVAL & MGMT: IMG5224

## 2016-08-11 NOTE — Consult Note (Signed)
Chief Complaint: Patient was seen in consultation today for  Chief Complaint  Patient presents with  . Advice Only    Consult for [place]     at the request of [name],[name]  Referring Physician(s): [name],[name]  History of Present Illness: Mary Mathis is a 41 y.o. female with dysmenorrhea and menorrhagia. Patient has been diagnosed with uterine fibroids based on a pelvic ultrasound. Patient has been having multiple medical problems over the past year including dizziness, vomiting and a panic attack. Many of these symptoms appear to be improving now that she is losing weight and her high blood pressure has been controlled. However, she is also complaining of heavy menstrual bleeding lasting 4-5 days. She is changing pads 5-6 times a day but she is requiring 3 pads at a time. Her menstrual periods are regular every month. Heavy bleeding last approximately 2 days. She does have some inter-period bleeding. She also complains of intermittent sharp pain in the left pelvic region right before her menstrual periods and this pelvic pain has been on and off for many years. Reportedly, the patient has a 17 week size uterus. She also complains of frequent urination and urinary urgency. No significant bowel problems. Along with her dizziness, she had occasional weakness or tingling in her hands but she says that has resolved. Patient had a negative Pap smear on 01/19/2017. She has not had an endometrial biopsy. She denies any significant GYN infections. Patient is not diabetic. Pregnancy history is G4, P2. Both pregnancies were cesarean section.  Past Medical History:  Diagnosis Date  . Anxiety   . Cholecystitis   . Fibroids   . Hypertension     Past Surgical History:  Procedure Laterality Date  . CESAREAN SECTION     x2  . CHOLECYSTECTOMY  05/04/2011   Procedure: LAPAROSCOPIC CHOLECYSTECTOMY;  Surgeon: Harl Bowie, MD;  Location: [facility];  Service: General;  Laterality: N/A;  .  INTRAOPERATIVE CHOLANGIOGRAM  05/04/2011   Procedure: INTRAOPERATIVE CHOLANGIOGRAM;  Surgeon: Harl Bowie, MD;  Location: [facility];  Service: General;  Laterality: N/A;  . TUBAL LIGATION     with last c-section    Allergies: Amoxicillin  Medications: Prior to Admission medications   Medication Sig Start Date End Date Taking? Authorizing Provider  amLODipine (NORVASC) 10 MG tablet Take 1 tablet (10 mg total) by mouth daily. 07/13/16  Yes Arnoldo Morale, MD  busPIRone (BUSPAR) 7.5 MG tablet Take 1 tablet (7.5 mg total) by mouth 2 (two) times daily. 07/31/16  Yes Arnoldo Morale, MD  carvedilol (COREG) 25 MG tablet Take 1 tablet (25 mg total) by mouth 2 (two) times daily with a meal. 07/31/16  Yes Amao, Enobong, MD  cloNIDine (CATAPRES) 0.1 MG tablet Take 1 tablet (0.1 mg total) by mouth at bedtime. 07/31/16  Yes Arnoldo Morale, MD  ibuprofen (ADVIL,MOTRIN) 800 MG tablet Take 1 tablet (800 mg total) by mouth every 8 (eight) hours as needed for moderate pain or cramping (Take with food.). 06/05/16  Yes Hairston, Mandesia R, FNP  lisinopril (PRINIVIL,ZESTRIL) 40 MG tablet Take 1 tablet (40 mg total) by mouth every morning. 07/31/16  Yes Arnoldo Morale, MD  Multiple Vitamin (MULTIVITAMIN WITH MINERALS) TABS tablet Take 1 tablet by mouth daily.   Yes [provider]     Family History  Problem Relation Age of Onset  . Hypertension Mother   . Diabetes Father   . Hypertension Father     Social History   Social  History  . Marital status: Single    Spouse name: N/A  . Number of children: N/A  . Years of education: N/A   Social History Main Topics  . Smoking status: Never Smoker  . Smokeless tobacco: Never Used  . Alcohol use No  . Drug use: No  . Sexual activity: Yes    Birth control/ protection: None   Other Topics Concern  . Not on file   Social History Narrative  . No narrative on file       Review of Systems: A 12 point ROS discussed and pertinent positives are indicated in  the HPI above.  All other systems are negative.  Review of Systems  Respiratory: Negative.   Cardiovascular: Positive for leg swelling.  Gastrointestinal: Positive for abdominal pain and vomiting.  Genitourinary: Positive for frequency, menstrual problem, pelvic pain, urgency and vaginal bleeding.  Neurological: Positive for dizziness and headaches.    Vital Signs: BP 126/61   Pulse 77   Temp 98.3 F (36.8 C) (Oral)   Resp 15   Ht 5\' 7"  (1.702 m)   Wt 300 lb (136.1 kg)   LMP 07/27/2016   SpO2 98%   BMI 46.99 kg/m   Physical Exam  Constitutional:  Obese female.  Cardiovascular: Normal rate, regular rhythm, normal heart sounds and intact distal pulses.   Pulmonary/Chest: Effort normal and breath sounds normal.  Abdominal: Soft. Bowel sounds are normal. She exhibits no distension. There is no tenderness.  Musculoskeletal: She exhibits edema.  Mild edema in both ankles.       Imaging:  CLINICAL DATA:  Menorrhagia  EXAM: TRANSABDOMINAL AND TRANSVAGINAL ULTRASOUND OF PELVIS  TECHNIQUE: Both transabdominal and transvaginal ultrasound examinations of the pelvis were performed. Transabdominal technique was performed for global imaging of the pelvis including uterus, ovaries, adnexal regions, and pelvic cul-de-sac. It was necessary to proceed with endovaginal exam following the transabdominal exam to better visualize the bilateral adnexa.  COMPARISON:  None  FINDINGS: Uterus  Measurements: 17.2 x 6.4 x 8.7 cm. Two dominant uterine fibroids, including:  --5.5 x 5.6 x 5.2 cm subserosal fibroid in the left anterior uterine fundus  --6.5 x 4.5 x 6.2 cm subserosal fibroid in the right posterior uterine body  Endometrium  Thickness: 8 mm.  No focal abnormality visualized.  Right ovary  Not discretely visualized.  No adnexal mass is seen.  Left ovary  Not discretely visualized.  No adnexal mass is seen.  Other findings  No abnormal free  fluid.  IMPRESSION: Two dominant subserosal fibroids, measuring up to 6.5 cm, as above.  Endometrial complex measures 8 mm, within normal limits.  Bilateral ovaries are not discretely visualized.   Electronically Signed   By: Julian Hy M.D.   On: 06/15/2016 11:44  Labs:  CBC:  Recent Labs  11/29/15 1309 12/14/15 1640 02/16/16 0602 02/16/16 0620 06/05/16 1223  WBC 7.8 8.0 7.4  --  7.1  HGB 11.4* 11.2* 12.3 12.6 11.7  HCT 36.1 36.3 37.1 37.0 36.4  PLT 291 290 186  --  241    COAGS: No results for input(s): INR, APTT in the last 8760 hours.  BMP:  Recent Labs  11/25/15 1118 11/29/15 1308 12/14/15 1640 02/16/16 0620 04/10/16 1110 06/29/16 1511  NA 141 138 139 141 139 141  K 3.4* 3.5 3.7 3.6 4.4 4.4  CL 109 107 107 99* 107 103  CO2 26 24 27   --  25 24  GLUCOSE 123* 120* 113* 108* 98 91  BUN 6 8 8  <3* 7 9  CALCIUM 8.9 9.0 9.0  --  8.8 8.9  CREATININE 0.87 0.96 0.82 0.90 0.91 0.96  GFRNONAA >60 >60 >60  --   --  75  GFRAA >60 >60 >60  --   --  86    LIVER FUNCTION TESTS:  Recent Labs  11/29/15 1308 06/29/16 1511  BILITOT 0.3 <0.2  AST 23 25  ALT 23 21  ALKPHOS 75 81  PROT 7.6 7.1  ALBUMIN 3.9 3.8    TUMOR MARKERS: No results for input(s): AFPTM, CEA, CA199, CHROMGRNA in the last 8760 hours.  Assessment and Plan:  41 year old female with menorrhagia and dysmenorrhea likely secondary to uterine fibroids. Patient has at least 2 dominant fibroids based on recent pelvic ultrasound. Based on the patient's history and imaging findings, the patient would be a candidate for uterine artery embolization procedure. We discussed different treatment options for fibroid management including hormonal therapy, surgery and uterine artery embolization. Due to patient's obesity, uterine artery embolization appears to be the most favorable option. We discussed uterine artery embolization procedure in depth. We discussed the need for additional tests  including an endometrial biopsy and pelvic MRI. We discussed performing the procedure with moderate sedation and requiring overnight observation for symptom management. Patient understands that she will likely be out of work for 2 weeks. The risks of the procedure including vascular injury, bleeding and infection were discussed. Patient has a very good understanding of the procedure and she would like to proceed with uterine artery embolization procedure. We will schedule the patient for her endometrial biopsy and pelvic MRI. If there is no contraindication for embolization based on MRI, then we will schedule the uterine artery embolization at the patient's convenience. Due to patient's obesity, I think she would be an excellent candidate for left radial access opposed to the traditional femoral access. We will assess her left radial artery at the time of the procedure.  Thank you for this interesting consult.  I greatly enjoyed meeting Charletta D Whetzel and look forward to participating in their care.  A copy of this report was sent to the requesting provider on this date.  Electronically Signed: Carylon Perches 08/11/2016, 11:58 AM   I spent a total of  30 Minutes   in face to face in clinical consultation, greater than 50% of which was counseling/coordinating care for uterine fibroids, menorrhagia and dysmenorrhea.

## 2016-08-11 NOTE — Addendum Note (Signed)
Addended by: Phillip Heal, DEMETRICE A on: 08/11/2016 08:50 AM   Modules accepted: Orders

## 2016-08-13 ENCOUNTER — Other Ambulatory Visit: Payer: Self-pay | Admitting: Pharmacist

## 2016-08-13 DIAGNOSIS — F419 Anxiety disorder, unspecified: Secondary | ICD-10-CM

## 2016-08-13 MED ORDER — BUSPIRONE HCL 7.5 MG PO TABS: 7.5000 mg | ORAL_TABLET | Freq: Two times a day (BID) | ORAL | 0 refills | Status: DC

## 2016-08-17 ENCOUNTER — Encounter: Payer: Self-pay | Admitting: Diagnostic Radiology

## 2016-08-18 ENCOUNTER — Other Ambulatory Visit: Payer: BLUE CROSS/BLUE SHIELD

## 2016-08-26 ENCOUNTER — Ambulatory Visit
Admission: RE | Admit: 2016-08-26 | Discharge: 2016-08-26 | Disposition: A | Discharge status: Home / Self Care | Payer: BLUE CROSS/BLUE SHIELD | Source: Ambulatory Visit | Attending: Family Medicine | Admitting: Family Medicine

## 2016-08-26 DIAGNOSIS — D252 Subserosal leiomyoma of uterus: Secondary | ICD-10-CM

## 2016-08-26 MED ORDER — GADOBENATE DIMEGLUMINE 529 MG/ML IV SOLN
20.0000 mL | Freq: Once | INTRAVENOUS | Status: AC | PRN
  Administered 2016-08-26: 20 mL via INTRAVENOUS

## 2016-08-30 HISTORY — PX: OTHER SURGICAL HISTORY: SHX169

## 2016-08-31 ENCOUNTER — Other Ambulatory Visit (HOSPITAL_COMMUNITY)
Admission: RE | Admit: 2016-08-31 | Discharge: 2016-08-31 | Disposition: A | Discharge status: Home / Self Care | Payer: BLUE CROSS/BLUE SHIELD | Source: Ambulatory Visit | Attending: Family Medicine | Admitting: Family Medicine

## 2016-08-31 ENCOUNTER — Encounter: Payer: Self-pay | Admitting: Family Medicine

## 2016-08-31 ENCOUNTER — Ambulatory Visit (INDEPENDENT_AMBULATORY_CARE_PROVIDER_SITE_OTHER): Payer: BLUE CROSS/BLUE SHIELD | Admitting: Family Medicine

## 2016-08-31 VITALS — BP 125/80 | HR 83 | Ht 67.0 in | Wt 328.8 lb

## 2016-08-31 DIAGNOSIS — N920 Excessive and frequent menstruation with regular cycle: Secondary | ICD-10-CM

## 2016-08-31 DIAGNOSIS — D252 Subserosal leiomyoma of uterus: Secondary | ICD-10-CM

## 2016-08-31 DIAGNOSIS — Z3202 Encounter for pregnancy test, result negative: Secondary | ICD-10-CM

## 2016-08-31 LAB — POCT PREGNANCY, URINE: Preg Test, Ur: NEGATIVE

## 2016-08-31 NOTE — Assessment & Plan Note (Signed)
Will coordinate f/u with IR for UFE planning.

## 2016-08-31 NOTE — Patient Instructions (Signed)

## 2016-08-31 NOTE — Assessment & Plan Note (Signed)
Endometrial biopsy completed

## 2016-08-31 NOTE — Progress Notes (Signed)
   Subjective:    Patient ID: Mary Mathis is a 41 y.o. female presenting with Biopsy  on 08/31/2016  HPI: Here today for f/u. Previously seen for abdominal pain and menorrhagia related to her fibroid uterus. She has gone to see IR for possible UFE and they have completed her MRI. She is referred back for EMB prior to the procedure.  Review of Systems  Constitutional: Negative for chills and fever.  Respiratory: Negative for shortness of breath.   Cardiovascular: Negative for chest pain.  Gastrointestinal: Negative for abdominal pain, nausea and vomiting.  Genitourinary: Negative for dysuria.  Skin: Negative for rash.      Objective:    BP 125/80   Pulse 83   Ht 5\' 7"  (1.702 m)   Wt (!) 328 lb 12.8 oz (149.1 kg)   LMP 08/17/2016 (Exact Date)   BMI 51.50 kg/m  Physical Exam  Constitutional: She is oriented to person, place, and time. She appears well-developed and well-nourished. No distress.  HENT:  Head: Normocephalic and atraumatic.  Eyes: No scleral icterus.  Neck: Neck supple.  Cardiovascular: Normal rate.   Pulmonary/Chest: Effort normal.  Abdominal: Soft.  Neurological: She is alert and oriented to person, place, and time.  Skin: Skin is warm and dry.  Psychiatric: She has a normal mood and affect.   Procedure: Patient given informed consent, signed copy in the chart, time out was performed. Appropriate time out taken. . The patient was placed in the lithotomy position and the cervix brought into view with sterile speculum.  Portio of cervix cleansed x 2 with betadine swabs.  A tenaculum was placed in the anterior lip of the cervix.  The uterus was sounded for depth of 10 cm. A pipelle was introduced to into the uterus, suction created,  and an endometrial sample was obtained. All equipment was removed and accounted for.  The patient tolerated the procedure well.      Assessment & Plan:   Problem List Items Addressed This Visit      Unprioritized   Menorrhagia - Primary    Endometrial biopsy completed      Relevant Orders   Surgical pathology   Uterine fibroid    Will coordinate f/u with IR for UFE planning.         Total face-to-face time with patient: 15 minutes. Over 50% of encounter was spent on counseling and coordination of care.  Donnamae Jude 08/31/2016 9:45 AM

## 2016-09-03 ENCOUNTER — Other Ambulatory Visit: Payer: Self-pay | Admitting: Family Medicine

## 2016-09-03 DIAGNOSIS — I10 Essential (primary) hypertension: Secondary | ICD-10-CM

## 2016-09-09 ENCOUNTER — Other Ambulatory Visit (HOSPITAL_COMMUNITY): Payer: Self-pay | Admitting: Diagnostic Radiology

## 2016-09-09 DIAGNOSIS — D259 Leiomyoma of uterus, unspecified: Secondary | ICD-10-CM

## 2016-09-16 ENCOUNTER — Other Ambulatory Visit: Payer: Self-pay | Admitting: Family Medicine

## 2016-09-16 DIAGNOSIS — I10 Essential (primary) hypertension: Secondary | ICD-10-CM

## 2016-09-28 ENCOUNTER — Other Ambulatory Visit: Payer: Self-pay | Admitting: Radiology

## 2016-09-29 ENCOUNTER — Ambulatory Visit (HOSPITAL_COMMUNITY)
Admission: RE | Admit: 2016-09-29 | Discharge: 2016-09-29 | Disposition: A | Discharge status: Home / Self Care | Payer: BLUE CROSS/BLUE SHIELD | Source: Ambulatory Visit | Attending: Diagnostic Radiology | Admitting: Diagnostic Radiology

## 2016-09-29 ENCOUNTER — Encounter (HOSPITAL_COMMUNITY): Payer: Self-pay

## 2016-09-29 ENCOUNTER — Observation Stay (HOSPITAL_COMMUNITY)
Admission: RE | Admit: 2016-09-29 | Discharge: 2016-09-30 | Disposition: A | Discharge status: Home / Self Care | Payer: BLUE CROSS/BLUE SHIELD | Source: Ambulatory Visit | Attending: Diagnostic Radiology | Admitting: Diagnostic Radiology

## 2016-09-29 DIAGNOSIS — Z79899 Other long term (current) drug therapy: Secondary | ICD-10-CM | POA: Diagnosis not present

## 2016-09-29 DIAGNOSIS — N920 Excessive and frequent menstruation with regular cycle: Secondary | ICD-10-CM | POA: Diagnosis present

## 2016-09-29 DIAGNOSIS — D259 Leiomyoma of uterus, unspecified: Principal | ICD-10-CM | POA: Insufficient documentation

## 2016-09-29 DIAGNOSIS — I10 Essential (primary) hypertension: Secondary | ICD-10-CM | POA: Insufficient documentation

## 2016-09-29 HISTORY — PX: IR EMBO TUMOR ORGAN ISCHEMIA INFARCT INC GUIDE ROADMAPPING: IMG5449

## 2016-09-29 HISTORY — PX: IR ANGIOGRAM SELECTIVE EACH ADDITIONAL VESSEL: IMG667

## 2016-09-29 HISTORY — PX: IR ANGIOGRAM PELVIS SELECTIVE OR SUPRASELECTIVE: IMG661

## 2016-09-29 HISTORY — PX: IR US GUIDE VASC ACCESS LEFT: IMG2389

## 2016-09-29 LAB — BASIC METABOLIC PANEL
ANION GAP: 8 (ref 5–15)
BUN: 11 mg/dL (ref 6–20)
CHLORIDE: 107 mmol/L (ref 101–111)
CO2: 23 mmol/L (ref 22–32)
Calcium: 9 mg/dL (ref 8.9–10.3)
Creatinine, Ser: 0.88 mg/dL (ref 0.44–1.00)
GFR calc non Af Amer: >60 mL/min (ref >60)
Glucose, Bld: 125 mg/dL — ABNORMAL HIGH (ref 65–99)
POTASSIUM: 3.8 mmol/L (ref 3.5–5.1)
SODIUM: 138 mmol/L (ref 135–145)

## 2016-09-29 LAB — CBC WITH DIFFERENTIAL/PLATELET
Basophils Absolute: 0 10*3/uL (ref 0.0–0.1)
Basophils Relative: 0 %
EOS ABS: 0.4 10*3/uL (ref 0.0–0.7)
Eosinophils Relative: 5 %
HEMATOCRIT: 37.4 % (ref 36.0–46.0)
HEMOGLOBIN: 12.9 g/dL (ref 12.0–15.0)
LYMPHS ABS: 2 10*3/uL (ref 0.7–4.0)
LYMPHS PCT: 25 %
MCH: 29.5 pg (ref 26.0–34.0)
MCHC: 34.5 g/dL (ref 30.0–36.0)
MCV: 85.4 fL (ref 78.0–100.0)
Monocytes Absolute: 0.7 10*3/uL (ref 0.1–1.0)
Monocytes Relative: 9 %
NEUTROS ABS: 4.8 10*3/uL (ref 1.7–7.7)
NEUTROS PCT: 61 %
Platelets: 273 10*3/uL (ref 150–400)
RBC: 4.38 MIL/uL (ref 3.87–5.11)
RDW: 13.3 % (ref 11.5–15.5)
WBC: 7.8 10*3/uL (ref 4.0–10.5)

## 2016-09-29 LAB — HCG, SERUM, QUALITATIVE: PREG SERUM: NEGATIVE

## 2016-09-29 LAB — PROTIME-INR
INR: 0.98
Prothrombin Time: 13 seconds (ref 11.4–15.2)

## 2016-09-29 MED ORDER — DOCUSATE SODIUM 100 MG PO CAPS
100.0000 mg | ORAL_CAPSULE | Freq: Two times a day (BID) | ORAL | Status: DC
  Administered 2016-09-29 – 2016-09-30 (×2): 100 mg via ORAL
  Filled 2016-09-29 (×2): qty 1

## 2016-09-29 MED ORDER — SODIUM CHLORIDE 0.9% FLUSH: 9.0000 mL | INTRAVENOUS | Status: DC | PRN

## 2016-09-29 MED ORDER — NALOXONE HCL 0.4 MG/ML IJ SOLN: 0.4000 mg | INTRAMUSCULAR | Status: DC | PRN

## 2016-09-29 MED ORDER — SODIUM CHLORIDE 0.9 % IV SOLN: 250.0000 mL | INTRAVENOUS | Status: DC | PRN

## 2016-09-29 MED ORDER — DIPHENHYDRAMINE HCL 12.5 MG/5ML PO ELIX: 12.5000 mg | ORAL_SOLUTION | Freq: Four times a day (QID) | ORAL | Status: DC | PRN

## 2016-09-29 MED ORDER — IOPAMIDOL (ISOVUE-300) INJECTION 61%
INTRAVENOUS | Status: AC
  Administered 2016-09-29: 15 mL via INTRAVENOUS
  Filled 2016-09-29: qty 400

## 2016-09-29 MED ORDER — MIDAZOLAM HCL 2 MG/2ML IJ SOLN
INTRAMUSCULAR | Status: AC
  Filled 2016-09-29: qty 6

## 2016-09-29 MED ORDER — PROMETHAZINE HCL 25 MG RE SUPP: 25.0000 mg | Freq: Three times a day (TID) | RECTAL | Status: DC | PRN

## 2016-09-29 MED ORDER — DIPHENHYDRAMINE HCL 50 MG/ML IJ SOLN: 12.5000 mg | Freq: Four times a day (QID) | INTRAMUSCULAR | Status: DC | PRN

## 2016-09-29 MED ORDER — KETOROLAC TROMETHAMINE 30 MG/ML IJ SOLN
30.0000 mg | INTRAMUSCULAR | Status: AC
  Administered 2016-09-29: 30 mg via INTRAVENOUS
  Filled 2016-09-29: qty 1

## 2016-09-29 MED ORDER — ONDANSETRON HCL 4 MG/2ML IJ SOLN: 4.0000 mg | Freq: Four times a day (QID) | INTRAMUSCULAR | Status: DC | PRN

## 2016-09-29 MED ORDER — SODIUM CHLORIDE 0.9 % IV SOLN
INTRAVENOUS | Status: DC
  Administered 2016-09-29: 08:00:00 via INTRAVENOUS

## 2016-09-29 MED ORDER — SODIUM CHLORIDE 0.9% FLUSH: 3.0000 mL | INTRAVENOUS | Status: DC | PRN

## 2016-09-29 MED ORDER — VERAPAMIL HCL 2.5 MG/ML IV SOLN
INTRA_ARTERIAL | Status: AC | PRN
  Administered 2016-09-29: 10:00:00 via INTRA_ARTERIAL

## 2016-09-29 MED ORDER — HEPARIN SODIUM (PORCINE) 1000 UNIT/ML IJ SOLN
INTRAMUSCULAR | Status: AC
  Filled 2016-09-29: qty 1

## 2016-09-29 MED ORDER — ONDANSETRON HCL 4 MG/2ML IJ SOLN
4.0000 mg | Freq: Four times a day (QID) | INTRAMUSCULAR | Status: DC | PRN
  Administered 2016-09-29: 4 mg via INTRAVENOUS
  Filled 2016-09-29: qty 2

## 2016-09-29 MED ORDER — SODIUM CHLORIDE 0.9% FLUSH
3.0000 mL | Freq: Two times a day (BID) | INTRAVENOUS | Status: DC
  Administered 2016-09-29 – 2016-09-30 (×2): 3 mL via INTRAVENOUS

## 2016-09-29 MED ORDER — FENTANYL 40 MCG/ML IV SOLN
INTRAVENOUS | Status: DC
  Administered 2016-09-29: 1000 ug via INTRAVENOUS
  Administered 2016-09-30: 75 ug via INTRAVENOUS
  Filled 2016-09-29: qty 25

## 2016-09-29 MED ORDER — LIDOCAINE HCL 1 % IJ SOLN
INTRAMUSCULAR | Status: AC | PRN
  Administered 2016-09-29: 5 mL

## 2016-09-29 MED ORDER — IOPAMIDOL (ISOVUE-300) INJECTION 61%
100.0000 mL | Freq: Once | INTRAVENOUS | Status: AC | PRN
  Administered 2016-09-29: 15 mL via INTRAVENOUS

## 2016-09-29 MED ORDER — CLINDAMYCIN PHOSPHATE 600 MG/50ML IV SOLN
600.0000 mg | Freq: Once | INTRAVENOUS | Status: AC
  Administered 2016-09-29: 600 mg via INTRAVENOUS
  Filled 2016-09-29: qty 50

## 2016-09-29 MED ORDER — IOPAMIDOL (ISOVUE-300) INJECTION 61%
100.0000 mL | Freq: Once | INTRAVENOUS | Status: AC | PRN
  Administered 2016-09-29: 90 mL via INTRAVENOUS

## 2016-09-29 MED ORDER — FENTANYL 40 MCG/ML IV SOLN: INTRAVENOUS | Status: DC

## 2016-09-29 MED ORDER — FENTANYL CITRATE (PF) 100 MCG/2ML IJ SOLN
INTRAMUSCULAR | Status: AC | PRN
  Administered 2016-09-29 (×4): 50 ug via INTRAVENOUS

## 2016-09-29 MED ORDER — VERAPAMIL HCL 2.5 MG/ML IV SOLN
INTRAVENOUS | Status: AC
  Filled 2016-09-29: qty 2

## 2016-09-29 MED ORDER — DIPHENHYDRAMINE HCL 12.5 MG/5ML PO ELIX
12.5000 mg | ORAL_SOLUTION | Freq: Four times a day (QID) | ORAL | Status: DC | PRN
  Filled 2016-09-29: qty 5

## 2016-09-29 MED ORDER — LIDOCAINE HCL 1 % IJ SOLN
INTRAMUSCULAR | Status: AC
  Filled 2016-09-29: qty 20

## 2016-09-29 MED ORDER — FENTANYL CITRATE (PF) 100 MCG/2ML IJ SOLN
INTRAMUSCULAR | Status: AC
  Filled 2016-09-29: qty 4

## 2016-09-29 MED ORDER — PROMETHAZINE HCL 25 MG PO TABS
25.0000 mg | ORAL_TABLET | Freq: Three times a day (TID) | ORAL | Status: DC | PRN
  Administered 2016-09-30: 25 mg via ORAL
  Filled 2016-09-29: qty 1

## 2016-09-29 MED ORDER — MIDAZOLAM HCL 2 MG/2ML IJ SOLN
INTRAMUSCULAR | Status: AC | PRN
  Administered 2016-09-29 (×4): 1 mg via INTRAVENOUS

## 2016-09-29 MED ORDER — MORPHINE SULFATE 2 MG/ML IV SOLN
INTRAVENOUS | Status: DC
  Administered 2016-09-29: 21 mg via INTRAVENOUS
  Administered 2016-09-29: 11:00:00 via INTRAVENOUS
  Filled 2016-09-29: qty 30

## 2016-09-29 MED ORDER — KETOROLAC TROMETHAMINE 30 MG/ML IJ SOLN
30.0000 mg | Freq: Four times a day (QID) | INTRAMUSCULAR | Status: DC
  Administered 2016-09-29 – 2016-09-30 (×3): 30 mg via INTRAVENOUS
  Filled 2016-09-29 (×3): qty 1

## 2016-09-29 NOTE — Procedures (Signed)
  Pre-operative Diagnosis: Uterine fibroids and menorrhagia       Post-operative Diagnosis: Same   Indications: Symptomatic fibroids.  Procedure: Uterine artery embolization  Findings: Left radial access. Successful particle embolization of bilateral uterine arteries.  Complications: None     EBL: Minimal  Plan: Overnight observation for symptom control.

## 2016-09-29 NOTE — Progress Notes (Signed)
Wasted 8 cc of morphine 2 mg/mL PCA injection in the sink,witnessed by Vincenza Hews

## 2016-09-29 NOTE — H&P (Signed)
Chief Complaint: Patient was seen in consultation today for bilateral uterine artery embolization Referring Physician(s): [name],T  Supervising Physician: Markus Daft  Patient Status: [facility] - Out-pt  TBA  History of Present Illness: Mary Mathis is a 41 y.o. female familiar to IR service from recent consultation with Dr. Anselm Pancoast on 08/11/16 to discuss treatment options for symptomatic uterine fibroids. She was deemed an appropriate candidate for bilateral uterine artery embolization and presents today for the procedure.  Past Medical History:  Diagnosis Date  . Anxiety   . Cholecystitis   . Fibroids   . Hypertension     Past Surgical History:  Procedure Laterality Date  . CESAREAN SECTION     x2  . CHOLECYSTECTOMY  05/04/2011   Procedure: LAPAROSCOPIC CHOLECYSTECTOMY;  Surgeon: Harl Bowie, MD;  Location: [facility];  Service: General;  Laterality: N/A;  . INTRAOPERATIVE CHOLANGIOGRAM  05/04/2011   Procedure: INTRAOPERATIVE CHOLANGIOGRAM;  Surgeon: Harl Bowie, MD;  Location: [facility];  Service: General;  Laterality: N/A;  . IR RADIOLOGIST EVAL & MGMT  08/11/2016  . TUBAL LIGATION     with last c-section    Allergies: Amoxicillin  Medications: Prior to Admission medications   Medication Sig Start Date End Date Taking? Authorizing Provider  amLODipine (NORVASC) 10 MG tablet TAKE 1 TABLET BY MOUTH EVERY DAY 09/16/16  Yes Funches, Josalyn, MD  carvedilol (COREG) 25 MG tablet Take 1 tablet (25 mg total) by mouth 2 (two) times daily with a meal. 07/31/16  Yes Amao, Enobong, MD  lisinopril (PRINIVIL,ZESTRIL) 40 MG tablet Take 1 tablet (40 mg total) by mouth every morning. 07/31/16  Yes Arnoldo Morale, MD  Multiple Vitamin (MULTIVITAMIN WITH MINERALS) TABS tablet Take 1 tablet by mouth daily.   Yes [provider]  busPIRone (BUSPAR) 7.5 MG tablet Take 1 tablet (7.5 mg total) by mouth 2 (two) times daily. 08/13/16   Arnoldo Morale, MD  cloNIDine (CATAPRES) 0.1 MG tablet  Take 1 tablet (0.1 mg total) by mouth at bedtime. 07/31/16   Arnoldo Morale, MD  ibuprofen (ADVIL,MOTRIN) 800 MG tablet Take 1 tablet (800 mg total) by mouth every 8 (eight) hours as needed for moderate pain or cramping (Take with food.). 06/05/16   Alfonse Spruce, FNP     Family History  Problem Relation Age of Onset  . Hypertension Mother   . Diabetes Father   . Hypertension Father     Social History   Social History  . Marital status: Single    Spouse name: N/A  . Number of children: N/A  . Years of education: N/A   Social History Main Topics  . Smoking status: Never Smoker  . Smokeless tobacco: Never Used  . Alcohol use No  . Drug use: No  . Sexual activity: No   Other Topics Concern  . None   Social History Narrative  . None      Review of Systems currently denies fever, headache, chest pain, dyspnea, cough, abdominal/back pain, nausea, vomiting, hematuria, dysuria or blood in stool.  Vital Signs: BP 137/83 (BP Location: Right Arm)   Pulse 73   Temp 98.4 F (36.9 C) (Oral)   Resp 18   LMP 09/18/2016   SpO2 99%   Physical Exam awake, alert. Chest clear to auscultation bilaterally. Heart with regular rate and rhythm. Abdomen obese, soft, positive bowel sounds, nontender. No lower extremity edema. Intact distal/radial pulses.  Mallampati Score:     Imaging: No results found.  Labs:  CBC:  Recent Labs  12/14/15 1640 02/16/16 0602 02/16/16 0620 06/05/16 1223 09/29/16 0745  WBC 8.0 7.4  --  7.1 7.8  HGB 11.2* 12.3 12.6 11.7 12.9  HCT 36.3 37.1 37.0 36.4 37.4  PLT 290 186  --  241 273    COAGS:  Recent Labs  09/29/16 0745  INR 0.98    BMP:  Recent Labs  11/29/15 1308 12/14/15 1640 02/16/16 0620 04/10/16 1110 06/29/16 1511 09/29/16 0745  NA 138 139 141 139 141 138  K 3.5 3.7 3.6 4.4 4.4 3.8  CL 107 107 99* 107 103 107  CO2 24 27  --  25 24 23   GLUCOSE 120* 113* 108* 98 91 125*  BUN 8 8 <3* 7 9 11   CALCIUM 9.0 9.0  --  8.8  8.9 9.0  CREATININE 0.96 0.82 0.90 0.91 0.96 0.88  GFRNONAA >60 >60  --   --  75 >60  GFRAA >60 >60  --   --  86 >60    LIVER FUNCTION TESTS:  Recent Labs  11/29/15 1308 06/29/16 1511  BILITOT 0.3 <0.2  AST 23 25  ALT 23 21  ALKPHOS 75 81  PROT 7.6 7.1  ALBUMIN 3.9 3.8    TUMOR MARKERS: No results for input(s): AFPTM, CEA, CA199, CHROMGRNA in the last 8760 hours.  Assessment and Plan: 41 year old female with history of symptomatic uterine fibroids; seen in consultation by Dr. Anselm Pancoast on 08/11/16 and deemed an appropriate candidate for bilateral uterine artery embolization. She presents today for the procedure. Details/risks of procedure, including but not limited to, internal bleeding, infection, contrast nephropathy, nontarget embolization discussed with patient with her understanding and consent.  Procedure she will be admitted to the hospital for overnight observation for pain control.   Thank you for this interesting consult.  I greatly enjoyed meeting Miki D Kisamore and look forward to participating in their care.  A copy of this report was sent to the requesting provider on this date.  Electronically Signed: D. Rowe Robert, PA-C 09/29/2016, 8:39 AM   I spent a total of 30 minutes  in face to face in clinical consultation, greater than 50% of which was counseling/coordinating care for bilateral uterine artery embolization

## 2016-09-29 NOTE — Progress Notes (Signed)
Patient ID: Ayaana Biondo Kulakowski, female   DOB: Apr 22, 1975, 41 y.o.   MRN: [id number] Patient complaining of pelvic cramping as expected. She states morphine is not helping very much. Had some nausea earlier. Denies vomiting. Has had a few sips of liquids. Has voided since Foley removed earlier today. Vital signs stable, afebrile Puncture site left radial artery clean, dry, nontender, no hematoma, intact radial pulse; mild-to-moderate tenderness central pelvic region Assessment/plan: Symptomatic uterine fibroids, status post successful bilateral uterine artery embolization via left radial artery access earlier today; for overnight observation for pain control; switch PCA morphine to fentanyl; IV hydration; follow up with Dr. Anselm Pancoast in IR clinic in 3-4 weeks.   Rowe Robert, [facility]

## 2016-09-29 NOTE — Progress Notes (Signed)
Witnessed [place] waste 8 mL of 2mg /mL morphine.

## 2016-09-30 ENCOUNTER — Other Ambulatory Visit: Payer: Self-pay | Admitting: Radiology

## 2016-09-30 DIAGNOSIS — D259 Leiomyoma of uterus, unspecified: Secondary | ICD-10-CM

## 2016-09-30 MED ORDER — HYDROCODONE-ACETAMINOPHEN 5-325 MG PO TABS
1.0000 | ORAL_TABLET | ORAL | Status: DC | PRN
  Administered 2016-09-30: 2 via ORAL
  Filled 2016-09-30: qty 2

## 2016-09-30 MED ORDER — AMLODIPINE BESYLATE 10 MG PO TABS
10.0000 mg | ORAL_TABLET | Freq: Every day | ORAL | Status: DC
  Administered 2016-09-30: 10 mg via ORAL
  Filled 2016-09-30: qty 1

## 2016-09-30 MED ORDER — LISINOPRIL 20 MG PO TABS
40.0000 mg | ORAL_TABLET | Freq: Every day | ORAL | Status: DC
Start: 2016-09-30 — End: 2016-09-30
  Administered 2016-09-30: 40 mg via ORAL
  Filled 2016-09-30: qty 2

## 2016-09-30 MED ORDER — CARVEDILOL 25 MG PO TABS
25.0000 mg | ORAL_TABLET | Freq: Two times a day (BID) | ORAL | Status: DC
  Administered 2016-09-30: 25 mg via ORAL
  Filled 2016-09-30: qty 1

## 2016-09-30 NOTE — Discharge Summary (Signed)
Patient ID: Mary Mathis MRN: [id number] DOB/AGE: 1975/10/12 41 y.o.  Admit date: 09/29/2016 Discharge date: 09/30/2016  Supervising Physician: Markus Daft  Patient Status: [facility] - In-pt  Admission Diagnoses: Symptomatic uterine fibroids  Discharge Diagnoses: Symptomatic uterine fibroids, status post bilateral uterine artery embolization via a left radial artery access on 09/29/16 Active Problems:   Menorrhagia  Past Medical History:  Diagnosis Date  . Anxiety   . Cholecystitis   . Fibroids   . Hypertension    Past Surgical History:  Procedure Laterality Date  . CESAREAN SECTION     x2  . CHOLECYSTECTOMY  05/04/2011   Procedure: LAPAROSCOPIC CHOLECYSTECTOMY;  Surgeon: Harl Bowie, MD;  Location: [facility];  Service: General;  Laterality: N/A;  . INTRAOPERATIVE CHOLANGIOGRAM  05/04/2011   Procedure: INTRAOPERATIVE CHOLANGIOGRAM;  Surgeon: Harl Bowie, MD;  Location: [facility];  Service: General;  Laterality: N/A;  . IR ANGIOGRAM PELVIS SELECTIVE OR SUPRASELECTIVE  09/29/2016  . IR ANGIOGRAM PELVIS SELECTIVE OR SUPRASELECTIVE  09/29/2016  . IR ANGIOGRAM SELECTIVE EACH ADDITIONAL VESSEL  09/29/2016  . IR ANGIOGRAM SELECTIVE EACH ADDITIONAL VESSEL  09/29/2016  . IR EMBO TUMOR ORGAN ISCHEMIA INFARCT INC GUIDE ROADMAPPING  09/29/2016  . IR RADIOLOGIST EVAL & MGMT  08/11/2016  . IR US GUIDE VASC ACCESS LEFT  09/29/2016  . TUBAL LIGATION     with last c-section      Discharged Condition: good  Hospital Course: [name] is a 41 year old female with history of symptomatic uterine fibroids. She was seen in consultation by Dr. Anselm Pancoast on 08/11/16 to discuss treatment options and was deemed an appropriate candidate for bilateral uterine artery embolization. She underwent the procedure at [facility] on 09/29/16 without immediate complications. The procedure was performed with IV conscious sedation and via left radial artery access. She was admitted to the hospital afterwards  for overnight observation for pain control. Overnight the patient did fairly well with expected pelvic cramping and occasional nausea.She was placed on PCA fentanyl pump and given antiemetics as needed. On the morning of discharge the patient was fairly stable. Pelvic cramping was minimal. She did have some intermittent mild nausea. She was able to ambulate, void and tolerate solid food without significant difficulty. Findings were discussed with Dr. Anselm Pancoast and she was deemed stable for discharge at this time. She was given prescriptions for Norco 5/325, #30, no refills, 1-2 tablets every 4-6 hours for moderate to severe pain, Phenergan 25 mg, #10, no refills, 1 tablet every 6 hours as needed for nausea, ibuprofen 600 mg, #20, no refills, 1 tablet every 6 hours for the next 5 days, Colace 100 mg, #20, no refills, 1 tablet twice daily as needed for constipation. She will continue her current home medications and follow up with Dr. Kennon Rounds as scheduled. She'll be scheduled for follow-up with Dr. Anselm Pancoast in the IR clinic in 3-4 weeks. She was told to contact our service in the interim with any additional questions or concerns.  Consults: none  Significant Diagnostic Studies:  Results for orders placed or performed during the hospital encounter of 74/12/87  Basic metabolic panel  Result Value Ref Range   Sodium 138 135 - 145 mmol/L   Potassium 3.8 3.5 - 5.1 mmol/L   Chloride 107 101 - 111 mmol/L   CO2 23 22 - 32 mmol/L   Glucose, Bld 125 (H) 65 - 99 mg/dL   BUN 11 6 - 20 mg/dL   Creatinine, Ser 0.88 0.44 - 1.00 mg/dL  Calcium 9.0 8.9 - 10.3 mg/dL   GFR calc non Af Amer >60 >60 mL/min   GFR calc Af Amer >60 >60 mL/min   Anion gap 8 5 - 15  CBC with Differential/Platelet  Result Value Ref Range   WBC 7.8 4.0 - 10.5 K/uL   RBC 4.38 3.87 - 5.11 MIL/uL   Hemoglobin 12.9 12.0 - 15.0 g/dL   HCT 37.4 36.0 - 46.0 %   MCV 85.4 78.0 - 100.0 fL   MCH 29.5 26.0 - 34.0 pg   MCHC 34.5 30.0 - 36.0 g/dL   RDW 13.3  11.5 - 15.5 %   Platelets 273 150 - 400 K/uL   Neutrophils Relative % 61 %   Neutro Abs 4.8 1.7 - 7.7 K/uL   Lymphocytes Relative 25 %   Lymphs Abs 2.0 0.7 - 4.0 K/uL   Monocytes Relative 9 %   Monocytes Absolute 0.7 0.1 - 1.0 K/uL   Eosinophils Relative 5 %   Eosinophils Absolute 0.4 0.0 - 0.7 K/uL   Basophils Relative 0 %   Basophils Absolute 0.0 0.0 - 0.1 K/uL  hCG, serum, qualitative  Result Value Ref Range   Preg, Serum NEGATIVE NEGATIVE  Protime-INR  Result Value Ref Range   Prothrombin Time 13.0 11.4 - 15.2 seconds   INR 0.98      Treatments: Successful bilateral uterine artery embolization via left radial artery access on 09/29/16  Discharge Exam: Blood pressure 121/85, pulse 84, temperature 98.3 F (36.8 C), temperature source Oral, resp. rate 17, last menstrual period 09/18/2016, SpO2 97 %. Patient awake, alert. Chest clear to auscultation bilaterally. Heart with regular rate and rhythm. Abdomen obese, soft, positive bowel sounds, nontender. Lower extremities with no edema. Left radial artery access site soft, clean, dry, nontender, intact pulse.  Disposition: 01-Home or Self Care  Discharge Instructions    Call MD for:  difficulty breathing, headache or visual disturbances    Complete by:  As directed    Call MD for:  extreme fatigue    Complete by:  As directed    Call MD for:  hives    Complete by:  As directed    Call MD for:  persistant dizziness or light-headedness    Complete by:  As directed    Call MD for:  persistant nausea and vomiting    Complete by:  As directed    Call MD for:  redness, tenderness, or signs of infection (pain, swelling, redness, odor or green/yellow discharge around incision site)    Complete by:  As directed    Call MD for:  severe uncontrolled pain    Complete by:  As directed    Call MD for:  temperature >100.4    Complete by:  As directed    Diet - low sodium heart healthy    Complete by:  As directed    Discharge  instructions    Complete by:  As directed    Monitor left wrist access site for any increasing pain ,swelling or numbness   Driving Restrictions    Complete by:  As directed    No driving for 48 hours   Increase activity slowly    Complete by:  As directed    Lifting restrictions    Complete by:  As directed    No heavy lifting for 3-4 days   May shower / Bathe    Complete by:  As directed    May walk up steps    Complete  by:  As directed    Sexual Activity Restrictions    Complete by:  As directed    No sexual intercourse for 1 week     Allergies as of 09/30/2016      Reactions   Amoxicillin Swelling, Rash   Swelling of the ankles and hands  Has patient had a PCN reaction causing immediate rash, facial/tongue/throat swelling, SOB or lightheadedness with hypotension: Yes Has patient had a PCN reaction causing severe rash involving mucus membranes or skin necrosis: No Has patient had a PCN reaction that required hospitalization Yes Has patient had a PCN reaction occurring within the last 10 years: Yes If all of the above answers are "NO", then may proceed with Cephalosporin use.      Medication List    STOP taking these medications   busPIRone 7.5 MG tablet Commonly known as:  BUSPAR   cloNIDine 0.1 MG tablet Commonly known as:  CATAPRES     TAKE these medications   amLODipine 10 MG tablet Commonly known as:  NORVASC TAKE 1 TABLET BY MOUTH EVERY DAY   carvedilol 25 MG tablet Commonly known as:  COREG Take 1 tablet (25 mg total) by mouth 2 (two) times daily with a meal.   ibuprofen 800 MG tablet Commonly known as:  ADVIL,MOTRIN Take 1 tablet (800 mg total) by mouth every 8 (eight) hours as needed for moderate pain or cramping (Take with food.).   lisinopril 40 MG tablet Commonly known as:  PRINIVIL,ZESTRIL Take 1 tablet (40 mg total) by mouth every morning.   multivitamin with minerals Tabs tablet Take 1 tablet by mouth daily.      Follow-up Information     Markus Daft, MD Follow up.   Specialty:  Interventional Radiology Why:  Radiology will call you with follow up appointment with Dr. Anselm Pancoast in the IR clinic in 3-4 weeks. Call [phone] or [phone] with any questions. Contact information: [address] [place] [address] [phone]        Donnamae Jude, MD Follow up.   Specialty:  Obstetrics and Gynecology Why:  Continue follow-up with Dr. Kennon Rounds as scheduled Contact information: [address] [address] [phone]            Electronically Signed: D. Rowe Robert, PA-C 09/30/2016, 11:56 AM   I have spent less than 30 minutes discharging Korene D Riquelme.

## 2016-09-30 NOTE — Discharge Instructions (Signed)
Uterine Artery Embolization for Fibroids, Care After °Refer to this sheet in the next few weeks. These instructions provide you with information on caring for yourself after your procedure. Your health care provider may also give you more specific instructions. Your treatment has been planned according to current medical practices, but problems sometimes occur. Call your health care provider if you have any problems or questions after your procedure. °What can I expect after the procedure? °After your procedure, it is typical to have cramping in the pelvis. You will be given pain medicine to control it. °Follow these instructions at home: °· Only take over-the-counter or prescription medicines for pain, discomfort, or fever as directed by your health care provider. °· Do not take aspirin. It can cause bleeding. °· Follow your health care provider’s advice regarding medicines given to you, diet, activity, and when to begin sexual activity. °· See your health care provider for follow-up care as directed. °Contact a health care provider if: °· You have a fever. °· You have redness, swelling, and pain around your incision site. °· You have pus draining from your incision. °· You have a rash. °Get help right away if: °· You have bleeding from your incision site. °· You have difficulty breathing. °· You have chest pain. °· You have severe abdominal pain. °· You have leg pain. °· You become dizzy and faint. °This information is not intended to replace advice given to you by your health care provider. Make sure you discuss any questions you have with your health care provider. °Document Released: 12/07/2012 Document Revised: 07/25/2015 Document Reviewed: 09/01/2012 °[employer] Interactive Patient Education © 2018 [employer] ° °

## 2016-10-21 ENCOUNTER — Ambulatory Visit
Admission: RE | Admit: 2016-10-21 | Discharge: 2016-10-21 | Disposition: A | Discharge status: Home / Self Care | Payer: BLUE CROSS/BLUE SHIELD | Source: Ambulatory Visit | Attending: Radiology | Admitting: Radiology

## 2016-10-21 DIAGNOSIS — D259 Leiomyoma of uterus, unspecified: Secondary | ICD-10-CM

## 2016-10-21 HISTORY — PX: IR RADIOLOGIST EVAL & MGMT: IMG5224

## 2016-10-21 NOTE — Consult Note (Signed)
Chief Complaint: Patient was seen in consultation today for  Chief Complaint  Patient presents with  . Follow-up    1 mo follow up [place]     at the request of Allred,Darrell K  Referring Physician(s): Darron Doom MD  History of Present Illness: Mary Mathis is a 41 y.o. female with uterine fibroids and menorrhagia. The patient underwent uterine artery embolization procedure on 09/29/2016. Patient's hospital course was unremarkable and she was discharged the day after the procedure. She had significant cramping for a few days after the procedure and had some hot flashes. She said that the cramping and hot flashes resolved in a few days. The patient had a menstrual period a few days after the procedure. The menstrual period lasted 5 days but it was very light. Patient went back to work in less than a week because she was asymptomatic. Overall, the patient is feeling very good. She feels like she has increased energy and decreased distention and bloating in her abdomen. Denies fevers or vaginal discharge. She has not had a second menstrual period since the procedure.   Past Medical History:  Diagnosis Date  . Anxiety   . Cholecystitis   . Fibroids   . Hypertension     Past Surgical History:  Procedure Laterality Date  . CESAREAN SECTION     x2  . CHOLECYSTECTOMY  05/04/2011   Procedure: LAPAROSCOPIC CHOLECYSTECTOMY;  Surgeon: Harl Bowie, MD;  Location: [facility];  Service: General;  Laterality: N/A;  . INTRAOPERATIVE CHOLANGIOGRAM  05/04/2011   Procedure: INTRAOPERATIVE CHOLANGIOGRAM;  Surgeon: Harl Bowie, MD;  Location: [facility];  Service: General;  Laterality: N/A;  . IR ANGIOGRAM PELVIS SELECTIVE OR SUPRASELECTIVE  09/29/2016  . IR ANGIOGRAM PELVIS SELECTIVE OR SUPRASELECTIVE  09/29/2016  . IR ANGIOGRAM SELECTIVE EACH ADDITIONAL VESSEL  09/29/2016  . IR ANGIOGRAM SELECTIVE EACH ADDITIONAL VESSEL  09/29/2016  . IR EMBO TUMOR ORGAN ISCHEMIA INFARCT INC GUIDE  ROADMAPPING  09/29/2016  . IR RADIOLOGIST EVAL & MGMT  08/11/2016  . IR US GUIDE VASC ACCESS LEFT  09/29/2016  . TUBAL LIGATION     with last c-section    Allergies: Amoxicillin  Medications: Prior to Admission medications   Medication Sig Start Date End Date Taking? Authorizing Provider  amLODipine (NORVASC) 10 MG tablet TAKE 1 TABLET BY MOUTH EVERY DAY 09/16/16  Yes Funches, Josalyn, MD  carvedilol (COREG) 25 MG tablet Take 1 tablet (25 mg total) by mouth 2 (two) times daily with a meal. 07/31/16  Yes Amao, Charlane Ferretti, MD  ibuprofen (ADVIL,MOTRIN) 800 MG tablet Take 1 tablet (800 mg total) by mouth every 8 (eight) hours as needed for moderate pain or cramping (Take with food.). 06/05/16  Yes Hairston, Mandesia R, FNP  lisinopril (PRINIVIL,ZESTRIL) 40 MG tablet Take 1 tablet (40 mg total) by mouth every morning. 07/31/16  Yes Arnoldo Morale, MD  Multiple Vitamin (MULTIVITAMIN WITH MINERALS) TABS tablet Take 1 tablet by mouth daily.   Yes [provider]     Family History  Problem Relation Age of Onset  . Hypertension Mother   . Diabetes Father   . Hypertension Father     Social History   Social History  . Marital status: Single    Spouse name: N/A  . Number of children: N/A  . Years of education: N/A   Social History Main Topics  . Smoking status: Never Smoker  . Smokeless tobacco: Never Used  . Alcohol use No  . Drug  use: No  . Sexual activity: No   Other Topics Concern  . Not on file   Social History Narrative  . No narrative on file    Review of Systems  Constitutional: Negative.   Gastrointestinal: Negative.   Genitourinary: Negative.     Vital Signs: BP (!) 106/58   Pulse 72   Temp 98.1 F (36.7 C) (Oral)   Resp 14   Ht 5\' 7"  (1.702 m)   Wt (!) 322 lb (146.1 kg)   LMP 10/01/2016   SpO2 99%   BMI 50.43 kg/m   Physical Exam  Constitutional: No distress.  Abdominal: Soft. She exhibits no distension. There is no tenderness.  Musculoskeletal:  Left  radial artery site was evaluated. Puncture wound is well-healed. No erythema or bruising. Strong left radial pulse.    Imaging: Ir Angiogram Pelvis Selective Or Supraselective  Result Date: 09/29/2016 INDICATION: 41 year old female with uterine fibroids and menorrhagia. EXAM: BILATERAL UTERINE ARTERY EMBOLIZATION MEDICATIONS: Clindamycin 600 mg IV. The antibiotic was administered within 1 hour of the procedure ANESTHESIA/SEDATION: Fentanyl 200 mcg IV; Versed 4.0 mg IV Moderate Sedation Time:  89 minutes The patient was continuously monitored during the procedure by the interventional radiology nurse under my direct supervision. CONTRAST:  90 mL Isovue-300 FLUOROSCOPY TIME:  Fluoroscopy Time: 20 minutes 18 seconds (2512 mGy). COMPLICATIONS: None immediate. PROCEDURE: Informed consent was obtained from the patient following explanation of the procedure, risks, benefits and alternatives. The patient understands, agrees and consents for the procedure. All questions were addressed. A time out was performed prior to the initiation of the procedure. The patient was placed supine on the interventional table. The left radial artery was evaluated with ultrasound. The left radial artery was adequate in size. Pre-procedure Nickolas Madrid test was performed. Patient is a Barbeau Type B and suitable for transradial approach. Left wrist was prepped and draped in sterile fashion. Maximal barrier sterile technique was utilized including caps, mask, sterile gowns, sterile gloves, sterile drape, hand hygiene and skin antiseptic. The skin was anesthetized 1% lidocaine. Using ultrasound guidance, a needle was directed the left radial artery and a wire was easily advanced. 5 French Glide sheath was placed. Cocktail containing 3000 units heparin, 200 mcg of nitroglycerin and 2.5 mg of Verapamil was injected through the sheath after hemodilution. 120 cm Berenstein-type catheter was easily advanced from the left arm into the pelvis with  fluoroscopic guidance. Catheter was advanced into the left internal iliac artery. Arteriograms were performed to identify the left uterine artery orfice. A high-flow microcatheter was advanced into the left uterine artery. One vial of Embospheres (500 - 700 micron) were injected through the microcatheter with fluoroscopic guidance. There was near complete stasis of the left uterine artery following administration of the particles. Microcatheter was removed and angiogram images were obtained through the 5 French catheter. Five French catheter was then used to cannulate the right internal iliac artery. The right uterine artery was identified with contrast angiograms. The microcatheter was advanced into the right uterine artery and angiograms were performed. One vial of Embospheres (500-700 micron) were injected through the microcatheter with fluoroscopic guidance. There was near complete stasis of the right uterine artery at the end of the procedure. The microcatheter was removed. Five French catheter was easily removed. 200 mcg of nitroglycerin was injected through the 5 [place] sheath. Five French sheath was removed using a TR band. There was hemostasis at the end of the procedure. Patient was taken back to Short Stay where the TR band  was removed per protocol. FINDINGS: Large bilateral uterine arteries. Bilateral uterine arteries were embolized with particles. Near complete stasis of the uterine artery following embolization. IMPRESSION: Successful uterine artery embolization procedure. Electronically Signed   By: Markus Daft M.D.   On: 09/29/2016 13:56   Ir Angiogram Pelvis Selective Or Supraselective  Result Date: 09/29/2016 INDICATION: 41 year old female with uterine fibroids and menorrhagia. EXAM: BILATERAL UTERINE ARTERY EMBOLIZATION MEDICATIONS: Clindamycin 600 mg IV. The antibiotic was administered within 1 hour of the procedure ANESTHESIA/SEDATION: Fentanyl 200 mcg IV; Versed 4.0 mg IV Moderate Sedation  Time:  89 minutes The patient was continuously monitored during the procedure by the interventional radiology nurse under my direct supervision. CONTRAST:  90 mL Isovue-300 FLUOROSCOPY TIME:  Fluoroscopy Time: 20 minutes 18 seconds (2512 mGy). COMPLICATIONS: None immediate. PROCEDURE: Informed consent was obtained from the patient following explanation of the procedure, risks, benefits and alternatives. The patient understands, agrees and consents for the procedure. All questions were addressed. A time out was performed prior to the initiation of the procedure. The patient was placed supine on the interventional table. The left radial artery was evaluated with ultrasound. The left radial artery was adequate in size. Pre-procedure Nickolas Madrid test was performed. Patient is a Barbeau Type B and suitable for transradial approach. Left wrist was prepped and draped in sterile fashion. Maximal barrier sterile technique was utilized including caps, mask, sterile gowns, sterile gloves, sterile drape, hand hygiene and skin antiseptic. The skin was anesthetized 1% lidocaine. Using ultrasound guidance, a needle was directed the left radial artery and a wire was easily advanced. 5 French Glide sheath was placed. Cocktail containing 3000 units heparin, 200 mcg of nitroglycerin and 2.5 mg of Verapamil was injected through the sheath after hemodilution. 120 cm Berenstein-type catheter was easily advanced from the left arm into the pelvis with fluoroscopic guidance. Catheter was advanced into the left internal iliac artery. Arteriograms were performed to identify the left uterine artery orfice. A high-flow microcatheter was advanced into the left uterine artery. One vial of Embospheres (500 - 700 micron) were injected through the microcatheter with fluoroscopic guidance. There was near complete stasis of the left uterine artery following administration of the particles. Microcatheter was removed and angiogram images were obtained  through the 5 French catheter. Five French catheter was then used to cannulate the right internal iliac artery. The right uterine artery was identified with contrast angiograms. The microcatheter was advanced into the right uterine artery and angiograms were performed. One vial of Embospheres (500-700 micron) were injected through the microcatheter with fluoroscopic guidance. There was near complete stasis of the right uterine artery at the end of the procedure. The microcatheter was removed. Five French catheter was easily removed. 200 mcg of nitroglycerin was injected through the 5 [place] sheath. Five French sheath was removed using a TR band. There was hemostasis at the end of the procedure. Patient was taken back to Short Stay where the TR band was removed per protocol. FINDINGS: Large bilateral uterine arteries. Bilateral uterine arteries were embolized with particles. Near complete stasis of the uterine artery following embolization. IMPRESSION: Successful uterine artery embolization procedure. Electronically Signed   By: Markus Daft M.D.   On: 09/29/2016 13:56   Ir Angiogram Selective Each Additional Vessel  Result Date: 09/29/2016 INDICATION: 41 year old female with uterine fibroids and menorrhagia. EXAM: BILATERAL UTERINE ARTERY EMBOLIZATION MEDICATIONS: Clindamycin 600 mg IV. The antibiotic was administered within 1 hour of the procedure ANESTHESIA/SEDATION: Fentanyl 200 mcg IV; Versed 4.0 mg IV Moderate  Sedation Time:  89 minutes The patient was continuously monitored during the procedure by the interventional radiology nurse under my direct supervision. CONTRAST:  90 mL Isovue-300 FLUOROSCOPY TIME:  Fluoroscopy Time: 20 minutes 18 seconds (2512 mGy). COMPLICATIONS: None immediate. PROCEDURE: Informed consent was obtained from the patient following explanation of the procedure, risks, benefits and alternatives. The patient understands, agrees and consents for the procedure. All questions were  addressed. A time out was performed prior to the initiation of the procedure. The patient was placed supine on the interventional table. The left radial artery was evaluated with ultrasound. The left radial artery was adequate in size. Pre-procedure Nickolas Madrid test was performed. Patient is a Barbeau Type B and suitable for transradial approach. Left wrist was prepped and draped in sterile fashion. Maximal barrier sterile technique was utilized including caps, mask, sterile gowns, sterile gloves, sterile drape, hand hygiene and skin antiseptic. The skin was anesthetized 1% lidocaine. Using ultrasound guidance, a needle was directed the left radial artery and a wire was easily advanced. 5 French Glide sheath was placed. Cocktail containing 3000 units heparin, 200 mcg of nitroglycerin and 2.5 mg of Verapamil was injected through the sheath after hemodilution. 120 cm Berenstein-type catheter was easily advanced from the left arm into the pelvis with fluoroscopic guidance. Catheter was advanced into the left internal iliac artery. Arteriograms were performed to identify the left uterine artery orfice. A high-flow microcatheter was advanced into the left uterine artery. One vial of Embospheres (500 - 700 micron) were injected through the microcatheter with fluoroscopic guidance. There was near complete stasis of the left uterine artery following administration of the particles. Microcatheter was removed and angiogram images were obtained through the 5 French catheter. Five French catheter was then used to cannulate the right internal iliac artery. The right uterine artery was identified with contrast angiograms. The microcatheter was advanced into the right uterine artery and angiograms were performed. One vial of Embospheres (500-700 micron) were injected through the microcatheter with fluoroscopic guidance. There was near complete stasis of the right uterine artery at the end of the procedure. The microcatheter was  removed. Five French catheter was easily removed. 200 mcg of nitroglycerin was injected through the 5 [place] sheath. Five French sheath was removed using a TR band. There was hemostasis at the end of the procedure. Patient was taken back to Short Stay where the TR band was removed per protocol. FINDINGS: Large bilateral uterine arteries. Bilateral uterine arteries were embolized with particles. Near complete stasis of the uterine artery following embolization. IMPRESSION: Successful uterine artery embolization procedure. Electronically Signed   By: Markus Daft M.D.   On: 09/29/2016 13:56   Ir Angiogram Selective Each Additional Vessel  Result Date: 09/29/2016 Markus Daft, MD     09/29/2016  1:11 PM Pre-operative Diagnosis: Uterine fibroids and menorrhagia    Post-operative Diagnosis: Same Indications: Symptomatic fibroids. Procedure: Uterine artery embolization Findings: Left radial access. Successful particle embolization of bilateral uterine arteries. Complications: None    EBL: Minimal Plan: Overnight observation for symptom control.    Ir US Guide Vasc Access Left  Result Date: 09/29/2016 INDICATION: 41 year old female with uterine fibroids and menorrhagia. EXAM: BILATERAL UTERINE ARTERY EMBOLIZATION MEDICATIONS: Clindamycin 600 mg IV. The antibiotic was administered within 1 hour of the procedure ANESTHESIA/SEDATION: Fentanyl 200 mcg IV; Versed 4.0 mg IV Moderate Sedation Time:  89 minutes The patient was continuously monitored during the procedure by the interventional radiology nurse under my direct supervision. CONTRAST:  90 mL Isovue-300 FLUOROSCOPY  TIME:  Fluoroscopy Time: 20 minutes 18 seconds (2512 mGy). COMPLICATIONS: None immediate. PROCEDURE: Informed consent was obtained from the patient following explanation of the procedure, risks, benefits and alternatives. The patient understands, agrees and consents for the procedure. All questions were addressed. A time out was performed prior to the  initiation of the procedure. The patient was placed supine on the interventional table. The left radial artery was evaluated with ultrasound. The left radial artery was adequate in size. Pre-procedure Nickolas Madrid test was performed. Patient is a Barbeau Type B and suitable for transradial approach. Left wrist was prepped and draped in sterile fashion. Maximal barrier sterile technique was utilized including caps, mask, sterile gowns, sterile gloves, sterile drape, hand hygiene and skin antiseptic. The skin was anesthetized 1% lidocaine. Using ultrasound guidance, a needle was directed the left radial artery and a wire was easily advanced. 5 French Glide sheath was placed. Cocktail containing 3000 units heparin, 200 mcg of nitroglycerin and 2.5 mg of Verapamil was injected through the sheath after hemodilution. 120 cm Berenstein-type catheter was easily advanced from the left arm into the pelvis with fluoroscopic guidance. Catheter was advanced into the left internal iliac artery. Arteriograms were performed to identify the left uterine artery orfice. A high-flow microcatheter was advanced into the left uterine artery. One vial of Embospheres (500 - 700 micron) were injected through the microcatheter with fluoroscopic guidance. There was near complete stasis of the left uterine artery following administration of the particles. Microcatheter was removed and angiogram images were obtained through the 5 French catheter. Five French catheter was then used to cannulate the right internal iliac artery. The right uterine artery was identified with contrast angiograms. The microcatheter was advanced into the right uterine artery and angiograms were performed. One vial of Embospheres (500-700 micron) were injected through the microcatheter with fluoroscopic guidance. There was near complete stasis of the right uterine artery at the end of the procedure. The microcatheter was removed. Five French catheter was easily removed. 200  mcg of nitroglycerin was injected through the 5 [place] sheath. Five French sheath was removed using a TR band. There was hemostasis at the end of the procedure. Patient was taken back to Short Stay where the TR band was removed per protocol. FINDINGS: Large bilateral uterine arteries. Bilateral uterine arteries were embolized with particles. Near complete stasis of the uterine artery following embolization. IMPRESSION: Successful uterine artery embolization procedure. Electronically Signed   By: Markus Daft M.D.   On: 09/29/2016 13:56   Ir Embo Tumor Organ Ischemia Infarct Inc Guide Roadmapping  Result Date: 09/29/2016 INDICATION: 41 year old female with uterine fibroids and menorrhagia. EXAM: BILATERAL UTERINE ARTERY EMBOLIZATION MEDICATIONS: Clindamycin 600 mg IV. The antibiotic was administered within 1 hour of the procedure ANESTHESIA/SEDATION: Fentanyl 200 mcg IV; Versed 4.0 mg IV Moderate Sedation Time:  89 minutes The patient was continuously monitored during the procedure by the interventional radiology nurse under my direct supervision. CONTRAST:  90 mL Isovue-300 FLUOROSCOPY TIME:  Fluoroscopy Time: 20 minutes 18 seconds (2512 mGy). COMPLICATIONS: None immediate. PROCEDURE: Informed consent was obtained from the patient following explanation of the procedure, risks, benefits and alternatives. The patient understands, agrees and consents for the procedure. All questions were addressed. A time out was performed prior to the initiation of the procedure. The patient was placed supine on the interventional table. The left radial artery was evaluated with ultrasound. The left radial artery was adequate in size. Pre-procedure Nickolas Madrid test was performed. Patient is a Charity fundraiser Type B and  suitable for transradial approach. Left wrist was prepped and draped in sterile fashion. Maximal barrier sterile technique was utilized including caps, mask, sterile gowns, sterile gloves, sterile drape, hand hygiene and skin  antiseptic. The skin was anesthetized 1% lidocaine. Using ultrasound guidance, a needle was directed the left radial artery and a wire was easily advanced. 5 French Glide sheath was placed. Cocktail containing 3000 units heparin, 200 mcg of nitroglycerin and 2.5 mg of Verapamil was injected through the sheath after hemodilution. 120 cm Berenstein-type catheter was easily advanced from the left arm into the pelvis with fluoroscopic guidance. Catheter was advanced into the left internal iliac artery. Arteriograms were performed to identify the left uterine artery orfice. A high-flow microcatheter was advanced into the left uterine artery. One vial of Embospheres (500 - 700 micron) were injected through the microcatheter with fluoroscopic guidance. There was near complete stasis of the left uterine artery following administration of the particles. Microcatheter was removed and angiogram images were obtained through the 5 French catheter. Five French catheter was then used to cannulate the right internal iliac artery. The right uterine artery was identified with contrast angiograms. The microcatheter was advanced into the right uterine artery and angiograms were performed. One vial of Embospheres (500-700 micron) were injected through the microcatheter with fluoroscopic guidance. There was near complete stasis of the right uterine artery at the end of the procedure. The microcatheter was removed. Five French catheter was easily removed. 200 mcg of nitroglycerin was injected through the 5 [place] sheath. Five French sheath was removed using a TR band. There was hemostasis at the end of the procedure. Patient was taken back to Short Stay where the TR band was removed per protocol. FINDINGS: Large bilateral uterine arteries. Bilateral uterine arteries were embolized with particles. Near complete stasis of the uterine artery following embolization. IMPRESSION: Successful uterine artery embolization procedure. Electronically  Signed   By: Markus Daft M.D.   On: 09/29/2016 13:56    Labs:  CBC:  Recent Labs  12/14/15 1640 02/16/16 0602 02/16/16 0620 06/05/16 1223 09/29/16 0745  WBC 8.0 7.4  --  7.1 7.8  HGB 11.2* 12.3 12.6 11.7 12.9  HCT 36.3 37.1 37.0 36.4 37.4  PLT 290 186  --  241 273    COAGS:  Recent Labs  09/29/16 0745  INR 0.98    BMP:  Recent Labs  11/29/15 1308 12/14/15 1640 02/16/16 0620 04/10/16 1110 06/29/16 1511 09/29/16 0745  NA 138 139 141 139 141 138  K 3.5 3.7 3.6 4.4 4.4 3.8  CL 107 107 99* 107 103 107  CO2 24 27  --  25 24 23   GLUCOSE 120* 113* 108* 98 91 125*  BUN 8 8 <3* 7 9 11   CALCIUM 9.0 9.0  --  8.8 8.9 9.0  CREATININE 0.96 0.82 0.90 0.91 0.96 0.88  GFRNONAA >60 >60  --   --  75 >60  GFRAA >60 >60  --   --  86 >60    LIVER FUNCTION TESTS:  Recent Labs  11/29/15 1308 06/29/16 1511  BILITOT 0.3 <0.2  AST 23 25  ALT 23 21  ALKPHOS 75 81  PROT 7.6 7.1  ALBUMIN 3.9 3.8    TUMOR MARKERS: No results for input(s): AFPTM, CEA, CA199, CHROMGRNA in the last 8760 hours.  Assessment and Plan:  41 year old with uterine fibroids and history of menorrhagia. Patient underwent a successful uterine artery embolization procedure on 09/29/2016. She recovered very quickly following the embolization procedure and  is very happy with the early results. Patient has no concerns at this time. Plan to follow-up with the patient in 2-3 months by telephone. Plan for follow-up MRI 6 months after the procedure to assess the uterus and posttreatment changes. Patient will contact us if she has any questions or concerns in the interim.    Electronically Signed: Carylon Perches 10/21/2016, 11:19 AM   I spent a total of    15 Minutes in face to face in clinical consultation, greater than 50% of which was counseling/coordinating care for uterine fibroids.

## 2016-10-23 ENCOUNTER — Encounter (HOSPITAL_COMMUNITY): Payer: Self-pay | Admitting: Diagnostic Radiology

## 2016-11-03 ENCOUNTER — Ambulatory Visit: Payer: BLUE CROSS/BLUE SHIELD | Admitting: Family Medicine

## 2016-11-23 ENCOUNTER — Other Ambulatory Visit: Payer: Self-pay | Admitting: Family Medicine

## 2016-11-23 DIAGNOSIS — I10 Essential (primary) hypertension: Secondary | ICD-10-CM

## 2016-12-14 ENCOUNTER — Encounter: Payer: Self-pay | Admitting: Diagnostic Radiology

## 2016-12-23 ENCOUNTER — Encounter (HOSPITAL_COMMUNITY): Payer: Self-pay

## 2016-12-23 ENCOUNTER — Emergency Department (HOSPITAL_COMMUNITY)
Admission: EM | Admit: 2016-12-23 | Discharge: 2016-12-24 | Disposition: A | Discharge status: Home / Self Care | Payer: BLUE CROSS/BLUE SHIELD | Attending: Emergency Medicine | Admitting: Emergency Medicine

## 2016-12-23 DIAGNOSIS — R002 Palpitations: Secondary | ICD-10-CM | POA: Diagnosis not present

## 2016-12-23 DIAGNOSIS — Z79899 Other long term (current) drug therapy: Secondary | ICD-10-CM | POA: Insufficient documentation

## 2016-12-23 DIAGNOSIS — I10 Essential (primary) hypertension: Secondary | ICD-10-CM | POA: Diagnosis not present

## 2016-12-23 NOTE — ED Notes (Signed)
Pt complains of fluttering in her chest, arms, and shoulders for one month, she said tonight it was worse especially when she laid down Pt also states that when this happens she has blurry vision

## 2016-12-24 LAB — I-STAT CHEM 8, ED
BUN: 8 mg/dL (ref 6–20)
CALCIUM ION: 1.17 mmol/L (ref 1.15–1.40)
Chloride: 104 mmol/L (ref 101–111)
Creatinine, Ser: 0.8 mg/dL (ref 0.44–1.00)
Glucose, Bld: 102 mg/dL — ABNORMAL HIGH (ref 65–99)
HEMATOCRIT: 40 % (ref 36.0–46.0)
HEMOGLOBIN: 13.6 g/dL (ref 12.0–15.0)
Potassium: 3.6 mmol/L (ref 3.5–5.1)
SODIUM: 141 mmol/L (ref 135–145)
TCO2: 25 mmol/L (ref 22–32)

## 2016-12-24 LAB — CBC WITH DIFFERENTIAL/PLATELET
BASOS PCT: 0 %
Basophils Absolute: 0 10*3/uL (ref 0.0–0.1)
EOS ABS: 0.3 10*3/uL (ref 0.0–0.7)
EOS PCT: 4 %
HCT: 39.3 % (ref 36.0–46.0)
HEMOGLOBIN: 12.9 g/dL (ref 12.0–15.0)
Lymphocytes Relative: 28 %
Lymphs Abs: 2.3 10*3/uL (ref 0.7–4.0)
MCH: 29.6 pg (ref 26.0–34.0)
MCHC: 32.8 g/dL (ref 30.0–36.0)
MCV: 90.1 fL (ref 78.0–100.0)
MONOS PCT: 12 %
Monocytes Absolute: 0.9 10*3/uL (ref 0.1–1.0)
NEUTROS PCT: 56 %
Neutro Abs: 4.7 10*3/uL (ref 1.7–7.7)
PLATELETS: 292 10*3/uL (ref 150–400)
RBC: 4.36 MIL/uL (ref 3.87–5.11)
RDW: 13.2 % (ref 11.5–15.5)
WBC: 8.2 10*3/uL (ref 4.0–10.5)

## 2016-12-24 LAB — I-STAT BETA HCG BLOOD, ED (MC, WL, AP ONLY): I-stat hCG, quantitative: <5 m[IU]/mL (ref <5)

## 2016-12-24 LAB — D-DIMER, QUANTITATIVE: D-Dimer, Quant: 0.36 ug/mL-FEU (ref 0.00–0.50)

## 2016-12-24 LAB — I-STAT TROPONIN, ED: TROPONIN I, POC: 0 ng/mL (ref 0.00–0.08)

## 2016-12-24 NOTE — ED Notes (Signed)
Pt is asymptomatic at this time. States that what she is experiencing is a fluttering sensation that starts in her chest and travels to her arms and then legs. Episodes last minutes and she stated that when this happens she becomes weak and looses dexterity in hands. She also stated that the episodes make her vision blurry but returns when the episode is over.

## 2016-12-24 NOTE — ED Provider Notes (Signed)
[facility] DEPT Provider Note   CSN: [id number] Arrival date & time: 12/23/16  2231     History   Chief Complaint Chief Complaint  Patient presents with  . Palpitations    HPI Mary Mathis is a 41 y.o. female.  The history is provided by the patient.  Palpitations   This is a recurrent problem. Episode onset: more than a month ago started before July. The problem occurs hourly. The problem has not changed since onset.The problem is associated with an unknown factor. Pertinent negatives include no diaphoresis, no fever, no malaise/fatigue, no numbness, no chest pain, no chest pressure, no claudication, no exertional chest pressure, no irregular heartbeat, no near-syncope, no orthopnea, no PND, no syncope, no abdominal pain, no nausea, no vomiting, no headaches, no back pain, no leg pain, no lower extremity edema, no dizziness, no weakness, no cough, no hemoptysis, no shortness of breath and no sputum production. She has tried nothing for the symptoms. The treatment provided no relief. There are no known risk factors. Her past medical history does not include hyperthyroidism.    Past Medical History:  Diagnosis Date  . Anxiety   . Cholecystitis   . Fibroids   . Hypertension     Patient Active Problem List   Diagnosis Date Noted  . Uterine fibroid 06/29/2016  . Anxiety 03/10/2016  . ASCUS of cervix with negative high risk HPV 02/11/2016  . Dysmenorrhea 01/20/2016  . Menorrhagia 01/10/2016  . Benign paroxysmal positional vertigo 01/10/2016  . Essential hypertension 07/06/2014  . Obesity 07/06/2014  . Pedal edema 07/06/2014    Past Surgical History:  Procedure Laterality Date  . CESAREAN SECTION     x2  . CHOLECYSTECTOMY  05/04/2011   Procedure: LAPAROSCOPIC CHOLECYSTECTOMY;  Surgeon: Harl Bowie, MD;  Location: [facility];  Service: General;  Laterality: N/A;  . INTRAOPERATIVE CHOLANGIOGRAM  05/04/2011   Procedure: INTRAOPERATIVE  CHOLANGIOGRAM;  Surgeon: Harl Bowie, MD;  Location: [facility];  Service: General;  Laterality: N/A;  . IR ANGIOGRAM PELVIS SELECTIVE OR SUPRASELECTIVE  09/29/2016  . IR ANGIOGRAM PELVIS SELECTIVE OR SUPRASELECTIVE  09/29/2016  . IR ANGIOGRAM SELECTIVE EACH ADDITIONAL VESSEL  09/29/2016  . IR ANGIOGRAM SELECTIVE EACH ADDITIONAL VESSEL  09/29/2016  . IR EMBO TUMOR ORGAN ISCHEMIA INFARCT INC GUIDE ROADMAPPING  09/29/2016  . IR RADIOLOGIST EVAL & MGMT  08/11/2016  . IR RADIOLOGIST EVAL & MGMT  10/21/2016  . IR US GUIDE VASC ACCESS LEFT  09/29/2016  . TUBAL LIGATION     with last c-section    OB History    Gravida Para Term Preterm AB Living   4 2 2   2 2    SAB TAB Ectopic Multiple Live Births                   Home Medications    Prior to Admission medications   Medication Sig Start Date End Date Taking? Authorizing Provider  amLODipine (NORVASC) 10 MG tablet TAKE 1 TABLET BY MOUTH EVERY DAY 09/16/16  Yes Funches, Josalyn, MD  carvedilol (COREG) 25 MG tablet Take 1 tablet (25 mg total) by mouth 2 (two) times daily with a meal. 07/31/16  Yes Amao, Enobong, MD  lisinopril (PRINIVIL,ZESTRIL) 40 MG tablet Take 1 tablet (40 mg total) by mouth every morning. 07/31/16  Yes Arnoldo Morale, MD  Multiple Vitamin (MULTIVITAMIN WITH MINERALS) TABS tablet Take 1 tablet by mouth daily.   Yes [provider]  carvedilol (COREG) 25  MG tablet TAKE 1 TABLET BY MOUTH TWICE A DAY WITH A MEAL Patient not taking: Reported on 12/24/2016 11/24/16   Arnoldo Morale, MD    Family History Family History  Problem Relation Age of Onset  . Hypertension Mother   . Diabetes Father   . Hypertension Father     Social History Social History  Substance Use Topics  . Smoking status: Never Smoker  . Smokeless tobacco: Never Used  . Alcohol use No     Allergies   Amoxicillin   Review of Systems Review of Systems  Constitutional: Negative for diaphoresis, fever and malaise/fatigue.  Respiratory: Negative  for cough, hemoptysis, sputum production and shortness of breath.   Cardiovascular: Positive for palpitations. Negative for chest pain, orthopnea, claudication, leg swelling, syncope, PND and near-syncope.  Gastrointestinal: Negative for abdominal pain, nausea and vomiting.  Musculoskeletal: Negative for back pain.  Neurological: Negative for dizziness, weakness, numbness and headaches.  All other systems reviewed and are negative.    Physical Exam Updated Vital Signs BP 117/77   Pulse 74   Temp 98.6 F (37 C) (Oral)   Resp (!) 25   Ht 5\' 7"  (1.702 m)   Wt (!) 147.4 kg (325 lb)   LMP 12/21/2016   SpO2 99%   BMI 50.90 kg/m   Physical Exam  Constitutional: She is oriented to person, place, and time. She appears well-developed and well-nourished. No distress.  HENT:  Head: Normocephalic and atraumatic.  Mouth/Throat: No oropharyngeal exudate.  Eyes: Pupils are equal, round, and reactive to light. Conjunctivae are normal.  Neck: Normal range of motion. Neck supple.  Cardiovascular: Normal rate, regular rhythm, normal heart sounds and intact distal pulses.   Pulmonary/Chest: Effort normal and breath sounds normal. She has no wheezes. She has no rales.  Abdominal: Soft. Bowel sounds are normal. She exhibits no mass. There is no tenderness. There is no rebound and no guarding.  Musculoskeletal: Normal range of motion.  Neurological: She is alert and oriented to person, place, and time. She displays normal reflexes.  Skin: Skin is warm and dry. Capillary refill takes less than 2 seconds.  Psychiatric: She has a normal mood and affect.     ED Treatments / Results  Labs (all labs ordered are listed, but only abnormal results are displayed)  Results for orders placed or performed during the hospital encounter of 12/23/16  CBC with Differential/Platelet  Result Value Ref Range   WBC 8.2 4.0 - 10.5 K/uL   RBC 4.36 3.87 - 5.11 MIL/uL   Hemoglobin 12.9 12.0 - 15.0 g/dL   HCT 39.3  36.0 - 46.0 %   MCV 90.1 78.0 - 100.0 fL   MCH 29.6 26.0 - 34.0 pg   MCHC 32.8 30.0 - 36.0 g/dL   RDW 13.2 11.5 - 15.5 %   Platelets 292 150 - 400 K/uL   Neutrophils Relative % 56 %   Neutro Abs 4.7 1.7 - 7.7 K/uL   Lymphocytes Relative 28 %   Lymphs Abs 2.3 0.7 - 4.0 K/uL   Monocytes Relative 12 %   Monocytes Absolute 0.9 0.1 - 1.0 K/uL   Eosinophils Relative 4 %   Eosinophils Absolute 0.3 0.0 - 0.7 K/uL   Basophils Relative 0 %   Basophils Absolute 0.0 0.0 - 0.1 K/uL  D-dimer, quantitative (not at [facility])  Result Value Ref Range   D-Dimer, Quant 0.36 0.00 - 0.50 ug/mL-FEU  I-Stat Chem 8, ED  Result Value Ref Range   Sodium 141  135 - 145 mmol/L   Potassium 3.6 3.5 - 5.1 mmol/L   Chloride 104 101 - 111 mmol/L   BUN 8 6 - 20 mg/dL   Creatinine, Ser 0.80 0.44 - 1.00 mg/dL   Glucose, Bld 102 (H) 65 - 99 mg/dL   Calcium, Ion 1.17 1.15 - 1.40 mmol/L   TCO2 25 22 - 32 mmol/L   Hemoglobin 13.6 12.0 - 15.0 g/dL   HCT 40.0 36.0 - 46.0 %  I-stat troponin, ED  Result Value Ref Range   Troponin i, poc 0.00 0.00 - 0.08 ng/mL   Comment 3          I-Stat Beta hCG blood, ED ([facility], [facility], [facility] only)  Result Value Ref Range   I-stat hCG, quantitative <5.0 <5 mIU/mL   Comment 3           No results found. EKG  EKG Interpretation  Date/Time:  Thursday December 24 2016 01:00:43 EDT Ventricular Rate:  80 PR Interval:    QRS Duration: 98 QT Interval:  400 QTC Calculation: 462 R Axis:   63 Text Interpretation:  Sinus rhythm Confirmed by Randal Buba, Aprile Dickenson ([id number]) on 12/24/2016 1:26:40 AM       Procedures Procedures (including critical care time)    Final Clinical Impressions(s) / ED Diagnoses   Final diagnoses:  Palpitations   I was very candid with the patient that we may not find the source of the ongoing problem as it has been Strict return precautions given for  Shortness of breath, swelling or the lips or tongue, chest pain, dyspnea on exertion, new weakness or numbness changes in  vision or speech,  Inability to tolerate liquids or food, changes in voice cough, altered mental status or any concerns. No signs of systemic illness or infection. The patient is nontoxic-appearing on exam and vital signs are within normal limits.    I have reviewed the triage vital signs and the nursing notes. Pertinent labs &imaging results that were available during my care of the patient were reviewed by me and considered in my medical decision making (see chart for details).  After history, exam, and medical workup I feel the patient has been appropriately medically screened and is safe for discharge home. Pertinent diagnoses were discussed with the patient. Patient was given return precautions.     Oswin Johal, MD 12/24/16 [id number]

## 2017-01-03 ENCOUNTER — Other Ambulatory Visit: Payer: Self-pay | Admitting: Family Medicine

## 2017-01-03 DIAGNOSIS — I10 Essential (primary) hypertension: Secondary | ICD-10-CM

## 2017-02-08 ENCOUNTER — Other Ambulatory Visit: Payer: Self-pay | Admitting: Internal Medicine

## 2017-02-08 DIAGNOSIS — I10 Essential (primary) hypertension: Secondary | ICD-10-CM

## 2017-02-11 ENCOUNTER — Encounter: Payer: Self-pay | Admitting: Family Medicine

## 2017-02-11 ENCOUNTER — Ambulatory Visit: Payer: BLUE CROSS/BLUE SHIELD | Attending: Family Medicine | Admitting: Family Medicine

## 2017-02-11 VITALS — BP 116/74 | HR 86 | Temp 98.3°F | Ht 67.0 in | Wt 326.8 lb

## 2017-02-11 DIAGNOSIS — Z79899 Other long term (current) drug therapy: Secondary | ICD-10-CM | POA: Insufficient documentation

## 2017-02-11 DIAGNOSIS — D259 Leiomyoma of uterus, unspecified: Secondary | ICD-10-CM | POA: Diagnosis not present

## 2017-02-11 DIAGNOSIS — R0789 Other chest pain: Secondary | ICD-10-CM

## 2017-02-11 DIAGNOSIS — F411 Generalized anxiety disorder: Secondary | ICD-10-CM | POA: Insufficient documentation

## 2017-02-11 DIAGNOSIS — R7303 Prediabetes: Secondary | ICD-10-CM | POA: Insufficient documentation

## 2017-02-11 DIAGNOSIS — Z9049 Acquired absence of other specified parts of digestive tract: Secondary | ICD-10-CM | POA: Diagnosis not present

## 2017-02-11 DIAGNOSIS — Z880 Allergy status to penicillin: Secondary | ICD-10-CM | POA: Insufficient documentation

## 2017-02-11 DIAGNOSIS — I10 Essential (primary) hypertension: Secondary | ICD-10-CM

## 2017-02-11 DIAGNOSIS — Z131 Encounter for screening for diabetes mellitus: Secondary | ICD-10-CM | POA: Diagnosis not present

## 2017-02-11 DIAGNOSIS — R232 Flushing: Secondary | ICD-10-CM | POA: Diagnosis not present

## 2017-02-11 LAB — POCT GLYCOSYLATED HEMOGLOBIN (HGB A1C): HEMOGLOBIN A1C: 5.7

## 2017-02-11 MED ORDER — CARVEDILOL 25 MG PO TABS: 25.0000 mg | ORAL_TABLET | Freq: Two times a day (BID) | ORAL | 1 refills | Status: DC

## 2017-02-11 MED ORDER — CLONIDINE HCL 0.1 MG PO TABS: 0.1000 mg | ORAL_TABLET | Freq: Every day | ORAL | 1 refills | Status: DC

## 2017-02-11 MED ORDER — AMLODIPINE BESYLATE 10 MG PO TABS: 10.0000 mg | ORAL_TABLET | Freq: Every day | ORAL | 1 refills | Status: DC

## 2017-02-11 NOTE — Progress Notes (Signed)
Subjective:  Patient ID: Mary Mathis, female    DOB: [birth year]  Age: 41 y.o. MRN: [id number]  CC: Hospitalization Follow-up   HPI Mary Mathis  is a 41 year old female with a history of hypertension, generalized anxiety who presents today for a follow-up of visit. She underwent bilateral uterine artery embolization on 09/29/16 due to uterine fibroids and has a follow-up MRI to assess for improvement.  She has been compliant with her antihypertensives and endorses tolerating her medications with no adverse effect.  She complains of a sensation in her chest which radiates to her shoulders and spreads to her hands and her thighs with associated tingling in her hands and sometimes blurry vision which she has had for a while but has increased in frequency lately  up to 4 times a day. Symptoms are relieved by taking hydrocodone even though she denies chest pain but then further characterizes it as chest tightness. Symptoms have occurred mostly at night and sitting up brings about improvement in symptoms but she has also noticed it while sitting at her desk at work as well. She had an ED visit 2 months ago for palpitations and similar symptoms and workup was unrevealing.  Denies abdominal pain, reflux. She was previously treated for hot flashes with clonidine which she discontinued; she was also treated with BuSpar for anxiety which was also discontinued because she thought she did not need it. She denies anxiety, depression.  Past Medical History:  Diagnosis Date  . Anxiety   . Cholecystitis   . Fibroids   . Hypertension     Past Surgical History:  Procedure Laterality Date  . CESAREAN SECTION     x2  . CHOLECYSTECTOMY  05/04/2011   Procedure: LAPAROSCOPIC CHOLECYSTECTOMY;  Surgeon: Harl Bowie, MD;  Location: [facility];  Service: General;  Laterality: N/A;  . INTRAOPERATIVE CHOLANGIOGRAM  05/04/2011   Procedure: INTRAOPERATIVE CHOLANGIOGRAM;  Surgeon: Harl Bowie,  MD;  Location: [facility];  Service: General;  Laterality: N/A;  . IR ANGIOGRAM PELVIS SELECTIVE OR SUPRASELECTIVE  09/29/2016  . IR ANGIOGRAM PELVIS SELECTIVE OR SUPRASELECTIVE  09/29/2016  . IR ANGIOGRAM SELECTIVE EACH ADDITIONAL VESSEL  09/29/2016  . IR ANGIOGRAM SELECTIVE EACH ADDITIONAL VESSEL  09/29/2016  . IR EMBO TUMOR ORGAN ISCHEMIA INFARCT INC GUIDE ROADMAPPING  09/29/2016  . IR RADIOLOGIST EVAL & MGMT  08/11/2016  . IR RADIOLOGIST EVAL & MGMT  10/21/2016  . IR US GUIDE VASC ACCESS LEFT  09/29/2016  . TUBAL LIGATION     with last c-section    Allergies  Allergen Reactions  . Amoxicillin Swelling and Rash    Swelling of the ankles and hands  Has patient had a PCN reaction causing immediate rash, facial/tongue/throat swelling, SOB or lightheadedness with hypotension: Yes Has patient had a PCN reaction causing severe rash involving mucus membranes or skin necrosis: No Has patient had a PCN reaction that required hospitalization Yes Has patient had a PCN reaction occurring within the last 10 years: Yes If all of the above answers are "NO", then may proceed with Cephalosporin use.      Outpatient Medications Prior to Visit  Medication Sig Dispense Refill  . lisinopril (PRINIVIL,ZESTRIL) 40 MG tablet TAKE 1 TABLET BY MOUTH EVERY DAY IN THE MORNING 7 tablet 0  . Multiple Vitamin (MULTIVITAMIN WITH MINERALS) TABS tablet Take 1 tablet by mouth daily.    Marland Kitchen amLODipine (NORVASC) 10 MG tablet TAKE 1 TABLET BY MOUTH EVERY DAY 90 tablet 0  . carvedilol (  COREG) 25 MG tablet Take 1 tablet (25 mg total) by mouth 2 (two) times daily with a meal. 60 tablet 3  . carvedilol (COREG) 25 MG tablet TAKE 1 TABLET BY MOUTH TWICE A DAY WITH A MEAL (Patient not taking: Reported on 12/24/2016) 60 tablet 3   No facility-administered medications prior to visit.     ROS Review of Systems  Constitutional: Negative for activity change, appetite change and fatigue.  HENT: Negative for congestion, sinus pressure and  sore throat.   Eyes: Negative for visual disturbance.  Respiratory: Negative for cough, chest tightness, shortness of breath and wheezing.   Cardiovascular: Negative for chest pain and palpitations.  Gastrointestinal: Negative for abdominal distention, abdominal pain and constipation.  Endocrine: Negative for polydipsia.  Genitourinary: Negative for dysuria and frequency.  Musculoskeletal: Negative for arthralgias and back pain.  Skin: Negative for rash.  Neurological: Negative for tremors, light-headedness and numbness.  Hematological: Does not bruise/bleed easily.  Psychiatric/Behavioral: Negative for agitation and behavioral problems.    Objective:  BP 116/74   Pulse 86   Temp 98.3 F (36.8 C) (Oral)   Ht 5\' 7"  (1.702 m)   Wt (!) 326 lb 12.8 oz (148.2 kg)   SpO2 97%   BMI 51.18 kg/m   BP/Weight 02/11/2017 12/24/2016 9/89/2119  Systolic BP 417 408 144  Diastolic BP 74 77 58  Wt. (Lbs) 326.8 325 322  BMI 51.18 50.9 50.43      Physical Exam  Constitutional: She is oriented to person, place, and time. She appears well-developed and well-nourished.  Cardiovascular: Normal rate, normal heart sounds and intact distal pulses.  No murmur heard. Pulmonary/Chest: Effort normal and breath sounds normal. She has no wheezes. She has no rales. She exhibits no tenderness.  Abdominal: Soft. Bowel sounds are normal. She exhibits no distension and no mass. There is no tenderness.  Musculoskeletal: Normal range of motion.  Neurological: She is alert and oriented to person, place, and time.  Skin: Skin is warm and dry.  Psychiatric: She has a normal mood and affect.     Assessment & Plan:   1. Essential hypertension Controlled Low-sodium diet - carvedilol (COREG) 25 MG tablet; Take 1 tablet (25 mg total) by mouth 2 (two) times daily with a meal.  Dispense: 180 tablet; Refill: 1 - amLODipine (NORVASC) 10 MG tablet; Take 1 tablet (10 mg total) by mouth daily.  Dispense: 90 tablet;  Refill: 1  2. Sensation of chest tightness Workup so far unrevealing including recent ED visit Will workup to exclude H. pylori gastritis versus GERD especially since symptoms improve at night when she sits up - H. pylori breath test  3. Flushing - H. pylori breath test  4. Diabetes mellitus screening A1c of 5.7 indicating prediabetes - POCT glycosylated hemoglobin (Hb A1C)   Meds ordered this encounter  Medications  . carvedilol (COREG) 25 MG tablet    Sig: Take 1 tablet (25 mg total) by mouth 2 (two) times daily with a meal.    Dispense:  180 tablet    Refill:  1    Discontinue previous dose  . amLODipine (NORVASC) 10 MG tablet    Sig: Take 1 tablet (10 mg total) by mouth daily.    Dispense:  90 tablet    Refill:  1  . cloNIDine (CATAPRES) 0.1 MG tablet    Sig: Take 1 tablet (0.1 mg total) by mouth at bedtime. For insomnia and hot flashes    Dispense:  30 tablet  Refill:  1    Follow-up: Return in about 3 months (around 05/12/2017) for Follow-up on hypertension.   Arnoldo Morale MD

## 2017-02-12 LAB — H. PYLORI BREATH TEST: H PYLORI BREATH TEST: NEGATIVE

## 2017-02-16 ENCOUNTER — Telehealth: Payer: Self-pay

## 2017-02-16 NOTE — Telephone Encounter (Signed)
Pt was called and informed of lab results. 

## 2017-02-26 ENCOUNTER — Encounter: Payer: Self-pay | Admitting: Family Medicine

## 2017-02-26 ENCOUNTER — Ambulatory Visit: Payer: BLUE CROSS/BLUE SHIELD | Attending: Family Medicine | Admitting: Family Medicine

## 2017-02-26 VITALS — BP 122/86 | HR 91 | Temp 98.8°F | Resp 18 | Ht 67.0 in | Wt 325.0 lb

## 2017-02-26 DIAGNOSIS — I10 Essential (primary) hypertension: Secondary | ICD-10-CM | POA: Diagnosis not present

## 2017-02-26 DIAGNOSIS — F411 Generalized anxiety disorder: Secondary | ICD-10-CM | POA: Diagnosis not present

## 2017-02-26 DIAGNOSIS — K219 Gastro-esophageal reflux disease without esophagitis: Secondary | ICD-10-CM | POA: Insufficient documentation

## 2017-02-26 DIAGNOSIS — R202 Paresthesia of skin: Secondary | ICD-10-CM | POA: Diagnosis not present

## 2017-02-26 DIAGNOSIS — Z79899 Other long term (current) drug therapy: Secondary | ICD-10-CM | POA: Insufficient documentation

## 2017-02-26 MED ORDER — LISINOPRIL 40 MG PO TABS: ORAL_TABLET | ORAL | 6 refills | Status: DC

## 2017-02-26 MED ORDER — OMEPRAZOLE 20 MG PO CPDR: 20.0000 mg | DELAYED_RELEASE_CAPSULE | Freq: Every day | ORAL | 3 refills | Status: DC

## 2017-02-26 NOTE — Patient Instructions (Signed)

## 2017-02-26 NOTE — Progress Notes (Signed)
Subjective:  Patient ID: Mary Mathis, female    DOB: [birth year]  Age: 41 y.o. MRN: [id number]  CC: Medication Management   HPI Camika D Segoviano is a 41 year old female with a history of hypertension, generalized anxiety who presents today complaining of a sensation of heaviness in her chest which she experienced last night at about 7 PM (roughly 1 hour after her dinner) which radiated to her shoulders and her arms and lower extremities with associated tremors of her hands and was unrelieved by taking ibuprofen. She had to get up from a sitting position and walk around for symptoms to resolve and she is requesting a neurology referral.  At her last visit she complained of a sensation in her chest which radiated to her shoulders and spread to her hands and her thighs with associated tingling in her hands and sometimes blurry vision. Symptoms were relieved by taking hydrocodone even though she denied chest pain but then further characterized it as chest tightness. Symptoms had occurred mostly at night and sitting up brought about improvement in symptoms but she has also noticed it while sitting at her desk at work as well as while driving.  She had been placed on anxiolytics in the past which she discontinued because she stated she did not have anxiety. H. pylori breath test was negative. She denies associated wheezing, shortness of breath or chest pains.  Past Medical History:  Diagnosis Date  . Anxiety   . Cholecystitis   . Fibroids   . Hypertension     Past Surgical History:  Procedure Laterality Date  . CESAREAN SECTION     x2  . CHOLECYSTECTOMY  05/04/2011   Procedure: LAPAROSCOPIC CHOLECYSTECTOMY;  Surgeon: Harl Bowie, MD;  Location: [facility];  Service: General;  Laterality: N/A;  . INTRAOPERATIVE CHOLANGIOGRAM  05/04/2011   Procedure: INTRAOPERATIVE CHOLANGIOGRAM;  Surgeon: Harl Bowie, MD;  Location: [facility];  Service: General;  Laterality: N/A;  . IR ANGIOGRAM  PELVIS SELECTIVE OR SUPRASELECTIVE  09/29/2016  . IR ANGIOGRAM PELVIS SELECTIVE OR SUPRASELECTIVE  09/29/2016  . IR ANGIOGRAM SELECTIVE EACH ADDITIONAL VESSEL  09/29/2016  . IR ANGIOGRAM SELECTIVE EACH ADDITIONAL VESSEL  09/29/2016  . IR EMBO TUMOR ORGAN ISCHEMIA INFARCT INC GUIDE ROADMAPPING  09/29/2016  . IR RADIOLOGIST EVAL & MGMT  08/11/2016  . IR RADIOLOGIST EVAL & MGMT  10/21/2016  . IR US GUIDE VASC ACCESS LEFT  09/29/2016  . TUBAL LIGATION     with last c-section    Allergies  Allergen Reactions  . Amoxicillin Swelling and Rash    Swelling of the ankles and hands  Has patient had a PCN reaction causing immediate rash, facial/tongue/throat swelling, SOB or lightheadedness with hypotension: Yes Has patient had a PCN reaction causing severe rash involving mucus membranes or skin necrosis: No Has patient had a PCN reaction that required hospitalization Yes Has patient had a PCN reaction occurring within the last 10 years: Yes If all of the above answers are "NO", then may proceed with Cephalosporin use.      Outpatient Medications Prior to Visit  Medication Sig Dispense Refill  . amLODipine (NORVASC) 10 MG tablet Take 1 tablet (10 mg total) by mouth daily. 90 tablet 1  . carvedilol (COREG) 25 MG tablet Take 1 tablet (25 mg total) by mouth 2 (two) times daily with a meal. 180 tablet 1  . cloNIDine (CATAPRES) 0.1 MG tablet Take 1 tablet (0.1 mg total) by mouth at bedtime. For insomnia and hot  flashes 30 tablet 1  . Multiple Vitamin (MULTIVITAMIN WITH MINERALS) TABS tablet Take 1 tablet by mouth daily.    Marland Kitchen lisinopril (PRINIVIL,ZESTRIL) 40 MG tablet TAKE 1 TABLET BY MOUTH EVERY DAY IN THE MORNING 7 tablet 0  . carvedilol (COREG) 25 MG tablet TAKE 1 TABLET BY MOUTH TWICE A DAY WITH A MEAL (Patient not taking: Reported on 12/24/2016) 60 tablet 3   No facility-administered medications prior to visit.     ROS Review of Systems  Constitutional: Negative for activity change, appetite change  and fatigue.  HENT: Negative for congestion, sinus pressure and sore throat.   Eyes: Negative for visual disturbance.  Respiratory: Negative for cough, chest tightness, shortness of breath and wheezing.   Cardiovascular: Negative for chest pain and palpitations.  Gastrointestinal: Negative for abdominal distention, abdominal pain and constipation.  Endocrine: Negative for polydipsia.  Genitourinary: Negative for dysuria and frequency.  Musculoskeletal: Negative for arthralgias and back pain.  Skin: Negative for rash.  Neurological: Negative for tremors, light-headedness and numbness.  Hematological: Does not bruise/bleed easily.  Psychiatric/Behavioral: Negative for agitation and behavioral problems.    Objective:  BP 122/86 (BP Location: Left Arm, Patient Position: Sitting, Cuff Size: Large)   Pulse 91   Temp 98.8 F (37.1 C) (Oral)   Resp 18   Ht 5\' 7"  (1.702 m)   Wt (!) 325 lb (147.4 kg)   SpO2 98%   BMI 50.90 kg/m   BP/Weight 02/26/2017 02/11/2017 09/60/4540  Systolic BP 981 191 478  Diastolic BP 86 74 77  Wt. (Lbs) 325 326.8 325  BMI 50.9 51.18 50.9      Physical Exam  Constitutional: She is oriented to person, place, and time. She appears well-developed and well-nourished.  Cardiovascular: Normal rate, normal heart sounds and intact distal pulses.  No murmur heard. Pulmonary/Chest: Effort normal and breath sounds normal. She has no wheezes. She has no rales. She exhibits no tenderness.  Abdominal: Soft. Bowel sounds are normal. She exhibits no distension and no mass. There is no tenderness.  Musculoskeletal: Normal range of motion.  Neurological: She is alert and oriented to person, place, and time.  Skin: Skin is warm and dry.  Psychiatric: She has a normal mood and affect.     Assessment & Plan:   1. Essential hypertension Controlled Continue antihypertensive Low sodium, DASH diet - lisinopril (PRINIVIL,ZESTRIL) 40 MG tablet; TAKE 1 TABLET BY MOUTH EVERY  DAY IN THE MORNING  Dispense: 30 tablet; Refill: 6  2. Gastroesophageal reflux disease without esophagitis Nonspecific chest symptoms H. pylori negative We will place on PPI - omeprazole (PRILOSEC) 20 MG capsule; Take 1 capsule (20 mg total) by mouth daily.  Dispense: 30 capsule; Refill: 3  3. Paresthesia Patient has complained of various symptoms which have pointed towards anxiety vs GERD but she denies suffering from anxiety and declines taking anxiolytics Requesting neurology referral which I have placed. - Ambulatory referral to Neurology   Meds ordered this encounter  Medications  . omeprazole (PRILOSEC) 20 MG capsule    Sig: Take 1 capsule (20 mg total) by mouth daily.    Dispense:  30 capsule    Refill:  3  . lisinopril (PRINIVIL,ZESTRIL) 40 MG tablet    Sig: TAKE 1 TABLET BY MOUTH EVERY DAY IN THE MORNING    Dispense:  30 tablet    Refill:  6    Follow-up: Return for follow up of chronic medical condition, keep previously scheduled appointment.   Arnoldo Morale MD

## 2017-03-16 ENCOUNTER — Ambulatory Visit: Payer: BLUE CROSS/BLUE SHIELD | Admitting: Diagnostic Neuroimaging

## 2017-03-16 ENCOUNTER — Encounter: Payer: Self-pay | Admitting: Diagnostic Neuroimaging

## 2017-03-16 VITALS — BP 133/91 | HR 79 | Ht 67.0 in | Wt 331.2 lb

## 2017-03-16 DIAGNOSIS — R531 Weakness: Secondary | ICD-10-CM

## 2017-03-16 DIAGNOSIS — H538 Other visual disturbances: Secondary | ICD-10-CM

## 2017-03-16 DIAGNOSIS — R209 Unspecified disturbances of skin sensation: Secondary | ICD-10-CM

## 2017-03-16 NOTE — Patient Instructions (Signed)
Thank you for coming to see Mary Mathis at [facility]. I hope we have been able to provide you high quality care today.  You may receive a patient satisfaction survey over the next few weeks. We would appreciate your feedback and comments so that we may continue to improve ourselves and the health of our patients.  - check MRI brain and EEG - check B12 level   ~~~~~~~~~~~~~~~~~~~~~~~~~~~~~~~~~~~~~~~~~~~~~~~~~~~~~~~~~~~~~~~~~  DR. Amra Shukla'S GUIDE TO HAPPY AND HEALTHY LIVING These are some of my general health and wellness recommendations. Some of them may apply to you better than others. Please use common sense as you try these suggestions and feel free to ask me any questions.   ACTIVITY/FITNESS Mental, social, emotional and physical stimulation are very important for brain and body health. Try learning a new activity (arts, music, language, sports, games).  Keep moving your body to the best of your abilities. You can do this at home, inside or outside, the park, community center, gym or anywhere you like. Consider a physical therapist or personal trainer to get started. Fitness trackers, smart-watches or  smart-phones can help as well.   NUTRITION Eat more plants: colorful vegetables, nuts, seeds and berries.  Eat less sugar, salt, preservatives and processed foods.  Avoid toxins such as cigarettes and alcohol.  Drink water when you are thirsty. Warm water with a slice of lemon is an excellent morning drink to start the day.  Consider these websites for more information The Nutrition Source (https://www.henry-hernandez.biz/) Precision Nutrition (WindowBlog.ch)   RELAXATION Consider practicing mindfulness meditation or other relaxation techniques such as deep breathing, prayer, yoga, tai chi, massage. See website mindful.org or the apps Headspace or Calm to help get started.   SLEEP Try to get at least 7-8+ hours sleep per  day. Regular exercise and reduced caffeine will help you sleep better. Practice good sleep hygeine techniques. See website sleep.org for more information.   PLANNING Prepare estate planning, living will, healthcare POA documents. Sometimes this is best planned with the help of an attorney. Theconversationproject.org and agingwithdignity.org are excellent resources.

## 2017-03-16 NOTE — Progress Notes (Signed)
[facility]  PATIENT: Mary Mathis DOB: 1975/06/27  REFERRING CLINICIAN: Raquel James, MD HISTORY FROM: patient REASON FOR VISIT: new consult    HISTORICAL  CHIEF COMPLAINT:  Chief Complaint  Patient presents with  . NP  Dr. Arnoldo Morale  . other paresthesia    these sensations start in chest then down to shoulders, head and then bottom of feet., given med for anxiety, the refuls    HISTORY OF PRESENT ILLNESS:   42 year old female here for evaluation of abnormal spells.  Patient reports new onset intermittent abnormal sensations in her chest, stomach, hands and feet.  She describes a moving sensation in her chest and abdomen.  Sometimes she has chest pain.  Sometimes weakness in her body, blurred vision, uneasy sensation.  She has a hard time describing her symptoms.  She feels like something "inflammatory" is in her body.  Symptoms started around September 2017 initially with nausea and stomach pain, ultimately diagnosed with fibroids and treated.  Symptoms have increased since October 2018.  In the past patient was empirically treated for possible anxiety symptoms with BuSpar, but this did not help her symptoms.  She was recommended to see psychiatry but she declined.  Patient denies any current overt sources of stress, anxiety or depression.  No prior traumatic experiences.  No history of depression or anxiety.   REVIEW OF SYSTEMS: Full 14 system review of systems performed and negative with exception of: Blurred vision sleepiness decreased energy.  ALLERGIES: Allergies  Allergen Reactions  . Amoxicillin Swelling and Rash    Swelling of the ankles and hands  Has patient had a PCN reaction causing immediate rash, facial/tongue/throat swelling, SOB or lightheadedness with hypotension: Yes Has patient had a PCN reaction causing severe rash involving mucus membranes or skin necrosis: No Has patient had a PCN reaction that required hospitalization Yes Has  patient had a PCN reaction occurring within the last 10 years: Yes If all of the above answers are "NO", then may proceed with Cephalosporin use.     HOME MEDICATIONS: Outpatient Medications Prior to Visit  Medication Sig Dispense Refill  . amLODipine (NORVASC) 10 MG tablet Take 1 tablet (10 mg total) by mouth daily. 90 tablet 1  . carvedilol (COREG) 25 MG tablet Take 1 tablet (25 mg total) by mouth 2 (two) times daily with a meal. 180 tablet 1  . cloNIDine (CATAPRES) 0.1 MG tablet Take 1 tablet (0.1 mg total) by mouth at bedtime. For insomnia and hot flashes 30 tablet 1  . lisinopril (PRINIVIL,ZESTRIL) 40 MG tablet TAKE 1 TABLET BY MOUTH EVERY DAY IN THE MORNING 30 tablet 6  . Multiple Vitamin (MULTIVITAMIN WITH MINERALS) TABS tablet Take 1 tablet by mouth daily.    Marland Kitchen omeprazole (PRILOSEC) 20 MG capsule Take 1 capsule (20 mg total) by mouth daily. 30 capsule 3   No facility-administered medications prior to visit.     PAST MEDICAL HISTORY: Past Medical History:  Diagnosis Date  . Anxiety   . Cholecystitis   . Fibroids   . Hypertension     PAST SURGICAL HISTORY: Past Surgical History:  Procedure Laterality Date  . CESAREAN SECTION     x2  . CHOLECYSTECTOMY  05/04/2011   Procedure: LAPAROSCOPIC CHOLECYSTECTOMY;  Surgeon: Harl Bowie, MD;  Location: [facility];  Service: General;  Laterality: N/A;  . fiboroids  08/2016   surgery for fibroids  . INTRAOPERATIVE CHOLANGIOGRAM  05/04/2011   Procedure: INTRAOPERATIVE CHOLANGIOGRAM;  Surgeon: Harl Bowie, MD;  Location: [facility];  Service: General;  Laterality: N/A;  . IR ANGIOGRAM PELVIS SELECTIVE OR SUPRASELECTIVE  09/29/2016  . IR ANGIOGRAM PELVIS SELECTIVE OR SUPRASELECTIVE  09/29/2016  . IR ANGIOGRAM SELECTIVE EACH ADDITIONAL VESSEL  09/29/2016  . IR ANGIOGRAM SELECTIVE EACH ADDITIONAL VESSEL  09/29/2016  . IR EMBO TUMOR ORGAN ISCHEMIA INFARCT INC GUIDE ROADMAPPING  09/29/2016  . IR RADIOLOGIST EVAL & MGMT  08/11/2016  . IR  RADIOLOGIST EVAL & MGMT  10/21/2016  . IR US GUIDE VASC ACCESS LEFT  09/29/2016  . TUBAL LIGATION     with last c-section    FAMILY HISTORY: Family History  Problem Relation Age of Onset  . Hypertension Mother   . Diabetes Father   . Hypertension Father     SOCIAL HISTORY:  Social History   Socioeconomic History  . Marital status: Single    Spouse name: Not on file  . Number of children: Not on file  . Years of education: Not on file  . Highest education level: Not on file  Social Needs  . Financial resource strain: Not on file  . Food insecurity - worry: Not on file  . Food insecurity - inability: Not on file  . Transportation needs - medical: Not on file  . Transportation needs - non-medical: Not on file  Occupational History  . Not on file  Tobacco Use  . Smoking status: Never Smoker  . Smokeless tobacco: Never Used  Substance and Sexual Activity  . Alcohol use: No  . Drug use: No  . Sexual activity: No    Birth control/protection: None, Surgical  Other Topics Concern  . Not on file  Social History Narrative  . Not on file     PHYSICAL EXAM  GENERAL EXAM/CONSTITUTIONAL: Vitals:  Vitals:   03/16/17 1517  BP: (!) 133/91  Pulse: 79  Weight: (!) 331 lb 3.2 oz (150.2 kg)  Height: 5\' 7"  (1.702 m)     Body mass index is 51.87 kg/m.  No exam data present  Patient is in no distress; well developed, nourished and groomed; neck is supple  CARDIOVASCULAR:  Examination of carotid arteries is normal; no carotid bruits  Regular rate and rhythm, no murmurs  Examination of peripheral vascular system by observation and palpation is normal  EYES:  Ophthalmoscopic exam of optic discs and posterior segments is normal; no papilledema or hemorrhages  MUSCULOSKELETAL:  Gait, strength, tone, movements noted in Neurologic exam below  NEUROLOGIC: MENTAL STATUS:  No flowsheet data found.  awake, alert, oriented to person, place and time  recent and remote  memory intact  normal attention and concentration  language fluent, comprehension intact, naming intact,   fund of knowledge appropriate  CRANIAL NERVE:   2nd - no papilledema on fundoscopic exam  2nd, 3rd, 4th, 6th - pupils equal and reactive to light, visual fields full to confrontation, extraocular muscles intact, no nystagmus  5th - facial sensation symmetric  7th - facial strength symmetric  8th - hearing intact  9th - palate elevates symmetrically, uvula midline  11th - shoulder shrug symmetric  12th - tongue protrusion midline  MOTOR:   normal bulk and tone, full strength in the BUE, BLE  SENSORY:   normal and symmetric to light touch, temperature, vibration  COORDINATION:   finger-nose-finger, fine finger movements normal  REFLEXES:   deep tendon reflexes present and symmetric  GAIT/STATION:   narrow based gait; romberg is negative    DIAGNOSTIC DATA (LABS, IMAGING, TESTING) - I  reviewed patient records, labs, notes, testing and imaging myself where available.  Lab Results  Component Value Date   WBC 8.2 12/24/2016   HGB 13.6 12/24/2016   HCT 40.0 12/24/2016   MCV 90.1 12/24/2016   PLT 292 12/24/2016      Component Value Date/Time   NA 141 12/24/2016 0106   NA 141 06/29/2016 1511   K 3.6 12/24/2016 0106   CL 104 12/24/2016 0106   CO2 23 09/29/2016 0745   GLUCOSE 102 (H) 12/24/2016 0106   BUN 8 12/24/2016 0106   BUN 9 06/29/2016 1511   CREATININE 0.80 12/24/2016 0106   CREATININE 0.91 04/10/2016 1110   CALCIUM 9.0 09/29/2016 0745   PROT 7.1 06/29/2016 1511   ALBUMIN 3.8 06/29/2016 1511   AST 25 06/29/2016 1511   ALT 21 06/29/2016 1511   ALKPHOS 81 06/29/2016 1511   BILITOT <0.2 06/29/2016 1511   GFRNONAA >60 09/29/2016 0745   GFRAA >60 09/29/2016 0745   No results found for: CHOL, HDL, LDLCALC, LDLDIRECT, TRIG, CHOLHDL Lab Results  Component Value Date   HGBA1C 5.7 02/11/2017   No results found for: VITAMINB12 Lab Results    Component Value Date   TSH 2.950 11/25/2015    12/14/15 CT head  - No acute intracranial abnormality.     ASSESSMENT AND PLAN  42 y.o. year old female here with intermittent abnl sensations since 2017, worse in Oct 2018. Neuro exam unremarkable.  Ddx: CNS autoimmune, inflamm, demyelinating , atypical seizures, metabolic, anxiety, conversion reaction  1. Weakness   2. Blurred vision   3. Abnormal sensation of upper extremity   4. Abnormal sensation of lower extremity      PLAN: - check MRI brain and EEG to rule other causes  Orders Placed This Encounter  Procedures  . MR BRAIN W WO CONTRAST  . Vitamin B12  . EEG adult   Return in about 6 months (around 09/13/2017).    Penni Bombard, MD 7/84/6962, 9:52 PM Certified in Neurology, Neurophysiology and Neuroimaging  [facility] [address] [place], [place] [address] [phone]

## 2017-03-17 ENCOUNTER — Telehealth: Payer: Self-pay | Admitting: Diagnostic Neuroimaging

## 2017-03-17 LAB — VITAMIN B12: VITAMIN B 12: 293 pg/mL (ref 232–1245)

## 2017-03-17 MED ORDER — ALPRAZOLAM 0.5 MG PO TABS: 0.5000 mg | ORAL_TABLET | ORAL | 0 refills | Status: DC | PRN

## 2017-03-17 NOTE — Telephone Encounter (Signed)
Patient is schedule to have her MRI done on Wednesday 03/24/17 on the [facility] mobile unit. She informed me that she is claustrophobic and would like something to calm her nerves. Please advise.

## 2017-03-17 NOTE — Telephone Encounter (Signed)
Fax confirmation received [facility] [phone], xanax for MRI.

## 2017-03-17 NOTE — Telephone Encounter (Signed)
Ok. Will rx xanax. -VRP

## 2017-03-18 ENCOUNTER — Telehealth: Payer: Self-pay | Admitting: *Deleted

## 2017-03-18 NOTE — Telephone Encounter (Signed)
She's having her MRI at the our [place] mobile unit.

## 2017-03-18 NOTE — Telephone Encounter (Signed)
-----   Message from Penni Bombard, MD sent at 03/17/2017  4:34 PM EST ----- Normal labs. Please call patient. -VRP

## 2017-03-18 NOTE — Telephone Encounter (Signed)
Spoke to pt and relayed that her labs were normal (B12).  She asked about her dates for her tests.  Confirmed dates and times for these.  She is taking xanax for claustrophobia, ? [facility] mobile?  Will verify with Raquel Sarna about location and she will call her back.

## 2017-03-18 NOTE — Telephone Encounter (Signed)
Spoke to pt and yes it is the [facility] mobile unit.  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  She verbalized udnerstanding.

## 2017-03-22 ENCOUNTER — Other Ambulatory Visit (HOSPITAL_COMMUNITY): Payer: Self-pay | Admitting: Diagnostic Radiology

## 2017-03-22 DIAGNOSIS — D219 Benign neoplasm of connective and other soft tissue, unspecified: Secondary | ICD-10-CM

## 2017-03-24 ENCOUNTER — Ambulatory Visit: Payer: BLUE CROSS/BLUE SHIELD

## 2017-03-24 DIAGNOSIS — H538 Other visual disturbances: Secondary | ICD-10-CM

## 2017-03-24 DIAGNOSIS — R531 Weakness: Secondary | ICD-10-CM

## 2017-03-24 DIAGNOSIS — R209 Unspecified disturbances of skin sensation: Secondary | ICD-10-CM | POA: Diagnosis not present

## 2017-03-24 MED ORDER — GADOPENTETATE DIMEGLUMINE 469.01 MG/ML IV SOLN: 20.0000 mL | Freq: Once | INTRAVENOUS | Status: AC | PRN

## 2017-03-25 ENCOUNTER — Ambulatory Visit: Payer: BLUE CROSS/BLUE SHIELD

## 2017-03-29 ENCOUNTER — Telehealth: Payer: Self-pay | Admitting: *Deleted

## 2017-03-29 NOTE — Telephone Encounter (Signed)
-----   Message from Penni Bombard, MD sent at 03/28/2017  7:56 PM EST ----- Unremarkable imaging results. Please call patient. Continue current plan. -VRP

## 2017-03-29 NOTE — Telephone Encounter (Signed)
LMVM for pt to call me back.   Ok to relay that her MRI normal. Continue current plan.

## 2017-03-30 NOTE — Telephone Encounter (Signed)
LMVM for pt to return call for MRI results.  

## 2017-03-30 NOTE — Telephone Encounter (Signed)
Pt returned call and I relayed that he MRI normal study.  She verbalized understanding.

## 2017-04-01 ENCOUNTER — Ambulatory Visit (INDEPENDENT_AMBULATORY_CARE_PROVIDER_SITE_OTHER): Payer: BLUE CROSS/BLUE SHIELD | Admitting: Diagnostic Neuroimaging

## 2017-04-01 DIAGNOSIS — R209 Unspecified disturbances of skin sensation: Secondary | ICD-10-CM

## 2017-04-01 DIAGNOSIS — R2990 Unspecified symptoms and signs involving the nervous system: Secondary | ICD-10-CM | POA: Diagnosis not present

## 2017-04-01 DIAGNOSIS — H538 Other visual disturbances: Secondary | ICD-10-CM

## 2017-04-01 DIAGNOSIS — R531 Weakness: Secondary | ICD-10-CM

## 2017-04-02 ENCOUNTER — Ambulatory Visit (HOSPITAL_COMMUNITY): Payer: BLUE CROSS/BLUE SHIELD

## 2017-04-06 ENCOUNTER — Other Ambulatory Visit: Payer: BLUE CROSS/BLUE SHIELD

## 2017-04-12 ENCOUNTER — Ambulatory Visit (HOSPITAL_COMMUNITY)
Admission: RE | Admit: 2017-04-12 | Discharge: 2017-04-12 | Disposition: A | Discharge status: Home / Self Care | Payer: BLUE CROSS/BLUE SHIELD | Source: Ambulatory Visit | Attending: Diagnostic Radiology | Admitting: Diagnostic Radiology

## 2017-04-12 DIAGNOSIS — D219 Benign neoplasm of connective and other soft tissue, unspecified: Secondary | ICD-10-CM | POA: Diagnosis not present

## 2017-04-12 LAB — POCT I-STAT CREATININE: Creatinine, Ser: 1.1 mg/dL — ABNORMAL HIGH (ref 0.44–1.00)

## 2017-04-12 MED ORDER — GADOBENATE DIMEGLUMINE 529 MG/ML IV SOLN
20.0000 mL | Freq: Once | INTRAVENOUS | Status: AC
  Administered 2017-04-12: 20 mL via INTRAVENOUS

## 2017-04-12 NOTE — Procedures (Signed)
   [facility]  EEG (ELECTROENCEPHALOGRAM) REPORT   STUDY DATE: 04/01/17 PATIENT NAME: Mary Mathis DOB: [birth year] MRN: [id number]  ORDERING CLINICIAN: penum   TECHNOLOGIST: Laretta Alstrom  TECHNIQUE: Electroencephalogram was recorded utilizing standard 10-20 system of lead placement and reformatted into average and bipolar montages.  RECORDING TIME: 24 minutes  ACTIVATION: hyperventilation and photic stimulation  CLINICAL INFORMATION: 42 year old female with abnormal spells.  FINDINGS: Posterior dominant background rhythms, which attenuate with eye opening, ranging 9-10 hertz and 15-20 microvolts. No focal, lateralizing, epileptiform activity or seizures are seen. Patient recorded in the awake and drowsy state. EKG channel shows regular rhythm of 70-80 beats per minute.   IMPRESSION:   Normal EEG in the awake and drowsy states.    INTERPRETING PHYSICIAN:  Penni Bombard, MD Certified in Neurology, Neurophysiology and Neuroimaging  [facility] [address] [place], [place] [address] [phone]

## 2017-04-13 ENCOUNTER — Telehealth: Payer: Self-pay | Admitting: *Deleted

## 2017-04-13 NOTE — Telephone Encounter (Signed)
Spoke with patient and informed her that her EEG was normal. She had already received results of lab and MRI. Advised she call prior to 6 month FU with any questions, concerns. She verbalized understanding, appreciation for call.

## 2017-05-04 ENCOUNTER — Ambulatory Visit
Admission: RE | Admit: 2017-05-04 | Discharge: 2017-05-04 | Disposition: A | Discharge status: Home / Self Care | Payer: BLUE CROSS/BLUE SHIELD | Source: Ambulatory Visit | Attending: Diagnostic Radiology | Admitting: Diagnostic Radiology

## 2017-05-04 ENCOUNTER — Encounter: Payer: Self-pay | Admitting: *Deleted

## 2017-05-04 DIAGNOSIS — D219 Benign neoplasm of connective and other soft tissue, unspecified: Secondary | ICD-10-CM

## 2017-05-04 HISTORY — PX: IR RADIOLOGIST EVAL & MGMT: IMG5224

## 2017-05-04 NOTE — Progress Notes (Signed)
Chief Complaint: Patient was seen in consultation today for  Chief Complaint  Patient presents with  . Follow-up    [place], 09/29/2016   at the request of Linard Daft  Referring Physician(s): Darron Doom  History of Present Illness: Mary Mathis is a 43 y.o. female with history of uterine fibroids causing dysmenorrhea and menorrhagia. Patient underwent uterine artery embolization procedure on 09/29/2016. Her menstrual periods continue to last approximately 5 days but they are lighter than they were prior to the procedure. She occasionally will have a heavy day of bleeding but this is not persistent. She no longer complains of cramping or pain during her menstrual period. She occasionally notices a small amount of brown discharge from her vagina. The vaginal discharge is intermittent. She denies any pelvic pain and she has not been sexually active. Occasionally the discharge is foul-smelling. She denies any fevers or chills. Patient has been dealing with vague chest and abdominal pain. She has recently started using Hemp oil and says that the immediate results have been excellent. She's been on this treatment for approximately one week.  Past Medical History:  Diagnosis Date  . Anxiety   . Cholecystitis   . Fibroids   . Hypertension     Past Surgical History:  Procedure Laterality Date  . CESAREAN SECTION     x2  . CHOLECYSTECTOMY  05/04/2011   Procedure: LAPAROSCOPIC CHOLECYSTECTOMY;  Surgeon: Harl Bowie, MD;  Location: [facility];  Service: General;  Laterality: N/A;  . fiboroids  08/2016   surgery for fibroids  . INTRAOPERATIVE CHOLANGIOGRAM  05/04/2011   Procedure: INTRAOPERATIVE CHOLANGIOGRAM;  Surgeon: Harl Bowie, MD;  Location: [facility];  Service: General;  Laterality: N/A;  . IR ANGIOGRAM PELVIS SELECTIVE OR SUPRASELECTIVE  09/29/2016  . IR ANGIOGRAM PELVIS SELECTIVE OR SUPRASELECTIVE  09/29/2016  . IR ANGIOGRAM SELECTIVE EACH ADDITIONAL VESSEL  09/29/2016  . IR  ANGIOGRAM SELECTIVE EACH ADDITIONAL VESSEL  09/29/2016  . IR EMBO TUMOR ORGAN ISCHEMIA INFARCT INC GUIDE ROADMAPPING  09/29/2016  . IR RADIOLOGIST EVAL & MGMT  08/11/2016  . IR RADIOLOGIST EVAL & MGMT  10/21/2016  . IR US GUIDE VASC ACCESS LEFT  09/29/2016  . TUBAL LIGATION     with last c-section    Allergies: Amoxicillin  Medications: Prior to Admission medications   Medication Sig Start Date End Date Taking? Authorizing Provider  ALPRAZolam Duanne Moron) 0.5 MG tablet Take 1 tablet (0.5 mg total) by mouth as needed for anxiety (for sedation before MRI scan; take 1 hour before scan; may repeat 15 min before scan). 03/17/17  Yes Penumalli, Earlean Polka, MD  amLODipine (NORVASC) 10 MG tablet Take 1 tablet (10 mg total) by mouth daily. 02/11/17  Yes Charlott Rakes, MD  carvedilol (COREG) 25 MG tablet Take 1 tablet (25 mg total) by mouth 2 (two) times daily with a meal. 02/11/17  Yes Newlin, Enobong, MD  cloNIDine (CATAPRES) 0.1 MG tablet Take 1 tablet (0.1 mg total) by mouth at bedtime. For insomnia and hot flashes 02/11/17  Yes Newlin, Enobong, MD  lisinopril (PRINIVIL,ZESTRIL) 40 MG tablet TAKE 1 TABLET BY MOUTH EVERY DAY IN THE MORNING 02/26/17  Yes Charlott Rakes, MD  Multiple Vitamin (MULTIVITAMIN WITH MINERALS) TABS tablet Take 1 tablet by mouth daily.   Yes [provider]  omeprazole (PRILOSEC) 20 MG capsule Take 1 capsule (20 mg total) by mouth daily. 02/26/17  Yes Charlott Rakes, MD     Family History  Problem Relation Age of Onset  .  Hypertension Mother   . Diabetes Father   . Hypertension Father     Social History   Socioeconomic History  . Marital status: Single    Spouse name: Not on file  . Number of children: Not on file  . Years of education: Not on file  . Highest education level: Not on file  Social Needs  . Financial resource strain: Not on file  . Food insecurity - worry: Not on file  . Food insecurity - inability: Not on file  . Transportation needs -  medical: Not on file  . Transportation needs - non-medical: Not on file  Occupational History  . Not on file  Tobacco Use  . Smoking status: Never Smoker  . Smokeless tobacco: Never Used  Substance and Sexual Activity  . Alcohol use: No  . Drug use: No  . Sexual activity: No    Birth control/protection: None, Surgical  Other Topics Concern  . Not on file  Social History Narrative  . Not on file      Review of Systems  Constitutional: Negative for chills and fever.  Cardiovascular: Positive for chest pain.  Genitourinary: Positive for vaginal discharge. Negative for pelvic pain and vaginal pain.    Vital Signs: BP 114/67   Pulse 82   Temp 98.3 F (36.8 C) (Oral)   Resp 14   Ht 5\' 7"  (1.702 m)   Wt (!) 326 lb (147.9 kg)   LMP 04/26/2017   SpO2 99%   BMI 51.06 kg/m    Physical Exam:  Not performed.  No acute distress  Imaging: Mr Pelvis W Wo Contrast  Result Date: 04/12/2017 CLINICAL DATA:  Followup of uterine artery embolization. Procedure approximately 7 months ago. EXAM: MRI PELVIS WITHOUT AND WITH CONTRAST TECHNIQUE: Multiplanar multisequence MR imaging of the pelvis was performed both before and after administration of intravenous contrast. CONTRAST:  42mL MULTIHANCE GADOBENATE DIMEGLUMINE 529 MG/ML IV SOLN COMPARISON:  08/26/2016 FINDINGS: Urinary Tract: No distal hydroureter.  Normal urinary bladder. Bowel:  Normal pelvic bowel loops. Vascular/Lymphatic:  No pelvic aneurysm or sidewall adenopathy. Reproductive: Uterus: Measures 13.9 x 7.1 x 6.4 cm (volume = 330 cm^3). Compare for480 cc on the prior Decreased size of uterine fibroids. Index right-sided intramural fundal lesion measures 4.5 x 3.7 x 5.1 cm. Compare 5.1 x 5.2 x 4.8 cm on the prior. No post-contrast enhancement. Anterior left-sided uterine body lesion is intramural and measures 4.3 x 4.3 x 4.6 cm. Compare 5.2 x 5.0 by 6.7 cm on the prior exam. No post-contrast enhancement. Normal endometrium. Right ovary:  Normal in morphology for age, with a dominant follicle within. Example image 6/series 6. Left ovary:  Normal for age, including on image 4/series 6. Other:  Trace free pelvic fluid is likely physiologic. Musculoskeletal:  Disc desiccation at the lumbosacral junction. IMPRESSION: 1. Response to therapy of uterine artery embolization, with decreased size and absent enhancement of uterine fibroids. 2.  No acute pelvic process. Electronically Signed   By: Abigail Miyamoto M.D.   On: 04/12/2017 15:25    Labs:  CBC: Recent Labs    06/05/16 1223 09/29/16 0745 12/24/16 0040 12/24/16 0106  WBC 7.1 7.8 8.2  --   HGB 11.7 12.9 12.9 13.6  HCT 36.4 37.4 39.3 40.0  PLT 241 273 292  --     COAGS: Recent Labs    09/29/16 0745  INR 0.98    BMP: Recent Labs    06/29/16 1511 09/29/16 0745 12/24/16 0106 04/12/17 1341  NA 141 138 141  --   K 4.4 3.8 3.6  --   CL 103 107 104  --   CO2 24 23  --   --   GLUCOSE 91 125* 102*  --   BUN 9 11 8   --   CALCIUM 8.9 9.0  --   --   CREATININE 0.96 0.88 0.80 1.10*  GFRNONAA 75 >60  --   --   GFRAA 86 >60  --   --     LIVER FUNCTION TESTS: Recent Labs    06/29/16 1511  BILITOT <0.2  AST 25  ALT 21  ALKPHOS 81  PROT 7.1  ALBUMIN 3.8    TUMOR MARKERS: No results for input(s): AFPTM, CEA, CA199, CHROMGRNA in the last 8760 hours.  Assessment and Plan:  42 year old female with history of menorrhagia and dysmenorrhea secondary to uterine fibroids. Patient has had a good outcome from the uterine artery embolization procedure. Overall, her menstrual bleeding is much lighter than it was prior to the procedure and her pain and cramping has resolved. MRI confirms that the uterine fibroids have been successfully treated and is no enhancement within the fibroids. In addition, the uterus has decreased in size.   Patient has occasional vaginal discharge. MRI demonstrates a treated fibroid along the posterior fundus which is protruding into the endometrial  cavity. I suspect that the occasional vaginal discharge could be related to involution of this uterine fibroid. I explained to the patient that the vaginal discharge could be related to this involuting uterine fibroid and hopefully this will eventually resolve. Patient has no signs of a uterine infection at this time. She denies fevers, chills or pelvic pain. However, if the vaginal discharge gets worse or suspicious for infection, then she should contact us or her gynecologist. She was planning to contact her gynecologist for a routine follow-up.   No further treatment for the uterine fibroids at this time. Patient will follow-up as needed.  Thank you for this interesting consult.  I greatly enjoyed meeting Mary Mathis and look forward to participating in their care.  A copy of this report was sent to the requesting provider on this date.  Electronically Signed: Burman Riis 05/04/2017, 11:37 AM   I spent a total of    15 Minutes in face to face in clinical consultation, greater than 50% of which was counseling/coordinating care for uterine fibroids.   Patient ID: Mary Mathis, female   DOB: [birth year], 42 y.o.   MRN: [id number]

## 2017-05-13 ENCOUNTER — Encounter: Payer: Self-pay | Admitting: Family Medicine

## 2017-05-13 ENCOUNTER — Ambulatory Visit: Payer: BLUE CROSS/BLUE SHIELD | Attending: Family Medicine | Admitting: Family Medicine

## 2017-05-13 VITALS — BP 129/77 | HR 75 | Temp 98.3°F | Ht 67.0 in | Wt 335.4 lb

## 2017-05-13 DIAGNOSIS — Z79899 Other long term (current) drug therapy: Secondary | ICD-10-CM | POA: Insufficient documentation

## 2017-05-13 DIAGNOSIS — Z9889 Other specified postprocedural states: Secondary | ICD-10-CM | POA: Diagnosis not present

## 2017-05-13 DIAGNOSIS — Z9049 Acquired absence of other specified parts of digestive tract: Secondary | ICD-10-CM | POA: Insufficient documentation

## 2017-05-13 DIAGNOSIS — F411 Generalized anxiety disorder: Secondary | ICD-10-CM | POA: Diagnosis not present

## 2017-05-13 DIAGNOSIS — Z880 Allergy status to penicillin: Secondary | ICD-10-CM | POA: Insufficient documentation

## 2017-05-13 DIAGNOSIS — I10 Essential (primary) hypertension: Secondary | ICD-10-CM | POA: Diagnosis not present

## 2017-05-13 NOTE — Patient Instructions (Signed)

## 2017-05-13 NOTE — Progress Notes (Signed)
Subjective:  Patient ID: Mary Mathis, female    DOB: 1975-12-19  Age: 42 y.o. MRN: [id number]  CC: Hypertension   HPI [name]  is a 42 year old female with a history of hypertension, generalized anxiety who presents today for a follow-up visit. At her last visit she had been complaining of a sensation of heaviness in her chest which she experienced at night which radiated to her shoulders and her arms and lower extremities with associated tremors of her hands and was unrelieved by taking ibuprofen.  She had requested neurology referral and remains of MRI of the brain and EEG were unremarkable. Review of testing for Helicobacter pylori was negative. She informs me she decided to try Hemp oil (states it is the version without marijuana) and this has brought about relief of her symptoms including improved sleep.  She states whenever she lies on her stomach she does have the symptoms along with fluttering which radiate up into her chest but otherwise she feels fine. Doing well on her antihypertensive and denies adverse effects.  Past Medical History:  Diagnosis Date  . Anxiety   . Cholecystitis   . Fibroids   . Hypertension     Past Surgical History:  Procedure Laterality Date  . CESAREAN SECTION     x2  . CHOLECYSTECTOMY  05/04/2011   Procedure: LAPAROSCOPIC CHOLECYSTECTOMY;  Surgeon: Harl Bowie, MD;  Location: [facility];  Service: General;  Laterality: N/A;  . fiboroids  08/2016   surgery for fibroids  . INTRAOPERATIVE CHOLANGIOGRAM  05/04/2011   Procedure: INTRAOPERATIVE CHOLANGIOGRAM;  Surgeon: Harl Bowie, MD;  Location: [facility];  Service: General;  Laterality: N/A;  . IR ANGIOGRAM PELVIS SELECTIVE OR SUPRASELECTIVE  09/29/2016  . IR ANGIOGRAM PELVIS SELECTIVE OR SUPRASELECTIVE  09/29/2016  . IR ANGIOGRAM SELECTIVE EACH ADDITIONAL VESSEL  09/29/2016  . IR ANGIOGRAM SELECTIVE EACH ADDITIONAL VESSEL  09/29/2016  . IR EMBO TUMOR ORGAN ISCHEMIA INFARCT INC GUIDE  ROADMAPPING  09/29/2016  . IR RADIOLOGIST EVAL & MGMT  08/11/2016  . IR RADIOLOGIST EVAL & MGMT  10/21/2016  . IR RADIOLOGIST EVAL & MGMT  05/04/2017  . IR US GUIDE VASC ACCESS LEFT  09/29/2016  . TUBAL LIGATION     with last c-section    Allergies  Allergen Reactions  . Amoxicillin Swelling and Rash    Swelling of the ankles and hands  Has patient had a PCN reaction causing immediate rash, facial/tongue/throat swelling, SOB or lightheadedness with hypotension: Yes Has patient had a PCN reaction causing severe rash involving mucus membranes or skin necrosis: No Has patient had a PCN reaction that required hospitalization Yes Has patient had a PCN reaction occurring within the last 10 years: Yes If all of the above answers are "NO", then may proceed with Cephalosporin use.      Outpatient Medications Prior to Visit  Medication Sig Dispense Refill  . amLODipine (NORVASC) 10 MG tablet Take 1 tablet (10 mg total) by mouth daily. 90 tablet 1  . carvedilol (COREG) 25 MG tablet Take 1 tablet (25 mg total) by mouth 2 (two) times daily with a meal. 180 tablet 1  . cloNIDine (CATAPRES) 0.1 MG tablet Take 1 tablet (0.1 mg total) by mouth at bedtime. For insomnia and hot flashes 30 tablet 1  . lisinopril (PRINIVIL,ZESTRIL) 40 MG tablet TAKE 1 TABLET BY MOUTH EVERY DAY IN THE MORNING 30 tablet 6  . Multiple Vitamin (MULTIVITAMIN WITH MINERALS) TABS tablet Take 1 tablet by mouth  daily.    Marland Kitchen ALPRAZolam (XANAX) 0.5 MG tablet Take 1 tablet (0.5 mg total) by mouth as needed for anxiety (for sedation before MRI scan; take 1 hour before scan; may repeat 15 min before scan). (Patient not taking: Reported on 05/13/2017) 3 tablet 0  . omeprazole (PRILOSEC) 20 MG capsule Take 1 capsule (20 mg total) by mouth daily. (Patient not taking: Reported on 05/13/2017) 30 capsule 3   Facility-Administered Medications Prior to Visit  Medication Dose Route Frequency Provider Last Rate Last Dose  . gadopentetate dimeglumine  (MAGNEVIST) injection 20 mL  20 mL Intravenous Once PRN Penumalli, Vikram R, MD        ROS Review of Systems  Constitutional: Negative for activity change, appetite change and fatigue.  HENT: Negative for congestion, sinus pressure and sore throat.   Eyes: Negative for visual disturbance.  Respiratory: Negative for cough, chest tightness, shortness of breath and wheezing.   Cardiovascular: Negative for chest pain and palpitations.  Gastrointestinal: Negative for abdominal distention, abdominal pain and constipation.  Endocrine: Negative for polydipsia.  Genitourinary: Negative for dysuria and frequency.  Musculoskeletal: Negative for arthralgias and back pain.  Skin: Negative for rash.  Neurological: Negative for tremors, light-headedness and numbness.  Hematological: Does not bruise/bleed easily.  Psychiatric/Behavioral: Negative for agitation and behavioral problems.    Objective:  BP 129/77   Pulse 75   Temp 98.3 F (36.8 C) (Oral)   Ht 5\' 7"  (1.702 m)   Wt (!) 335 lb 6.4 oz (152.1 kg)   LMP 04/26/2017   SpO2 97%   BMI 52.53 kg/m   BP/Weight 05/13/2017 05/04/2017 2/40/9735  Systolic BP 329 924 268  Diastolic BP 77 67 91  Wt. (Lbs) 335.4 326 331.2  BMI 52.53 51.06 51.87      Physical Exam  Constitutional: She is oriented to person, place, and time. She appears well-developed and well-nourished.  Cardiovascular: Normal rate, normal heart sounds and intact distal pulses.  No murmur heard. Pulmonary/Chest: Effort normal and breath sounds normal. She has no wheezes. She has no rales. She exhibits no tenderness.  Abdominal: Soft. Bowel sounds are normal. She exhibits no distension and no mass. There is no tenderness.  Musculoskeletal: Normal range of motion.  Neurological: She is alert and oriented to person, place, and time.  Skin: Skin is warm and dry.  Psychiatric: She has a normal mood and affect.     Assessment & Plan:   1. Essential  hypertension Controlled Continue lisinopril and carvedilol Counseled on blood pressure goal of less than 130/80, low-sodium, DASH diet, medication compliance, 150 minutes of moderate intensity exercise per week. Discussed medication compliance, adverse effects.    No orders of the defined types were placed in this encounter.   Follow-up: Return in about 3 months (around 08/13/2017) for follow up of Hypertension.   Charlott Rakes MD

## 2017-08-10 ENCOUNTER — Encounter: Payer: Self-pay | Admitting: Family Medicine

## 2017-08-10 ENCOUNTER — Ambulatory Visit: Payer: Self-pay | Attending: Family Medicine | Admitting: Family Medicine

## 2017-08-10 VITALS — BP 131/85 | HR 73 | Temp 97.8°F | Ht 67.0 in | Wt 339.8 lb

## 2017-08-10 DIAGNOSIS — R7303 Prediabetes: Secondary | ICD-10-CM

## 2017-08-10 DIAGNOSIS — E66813 Obesity, class 3: Secondary | ICD-10-CM

## 2017-08-10 DIAGNOSIS — Z6841 Body Mass Index (BMI) 40.0 and over, adult: Secondary | ICD-10-CM | POA: Insufficient documentation

## 2017-08-10 DIAGNOSIS — Z79899 Other long term (current) drug therapy: Secondary | ICD-10-CM | POA: Insufficient documentation

## 2017-08-10 DIAGNOSIS — Z131 Encounter for screening for diabetes mellitus: Secondary | ICD-10-CM

## 2017-08-10 DIAGNOSIS — F419 Anxiety disorder, unspecified: Secondary | ICD-10-CM | POA: Insufficient documentation

## 2017-08-10 DIAGNOSIS — I10 Essential (primary) hypertension: Secondary | ICD-10-CM

## 2017-08-10 DIAGNOSIS — F411 Generalized anxiety disorder: Secondary | ICD-10-CM | POA: Insufficient documentation

## 2017-08-10 LAB — POCT GLYCOSYLATED HEMOGLOBIN (HGB A1C): HbA1c, POC (prediabetic range): 5.7 % (ref 5.7–6.4)

## 2017-08-10 MED ORDER — LISINOPRIL 40 MG PO TABS: ORAL_TABLET | ORAL | 1 refills | Status: DC

## 2017-08-10 MED ORDER — CARVEDILOL 25 MG PO TABS: 25.0000 mg | ORAL_TABLET | Freq: Two times a day (BID) | ORAL | 1 refills | Status: DC

## 2017-08-10 MED ORDER — AMLODIPINE BESYLATE 10 MG PO TABS: 10.0000 mg | ORAL_TABLET | Freq: Every day | ORAL | 1 refills | Status: DC

## 2017-08-10 MED ORDER — LIRAGLUTIDE 18 MG/3ML ~~LOC~~ SOPN: 0.6000 mg | PEN_INJECTOR | Freq: Every day | SUBCUTANEOUS | 6 refills | Status: DC

## 2017-08-10 MED FILL — **VICTOZA 18 MG/3 ML INJECT: 18 | 30 days supply | Qty: 3 | Fill #0

## 2017-08-10 MED FILL — CARVEDILOL 25 MG TABLET: 25 | 30 days supply | Qty: 60 | Fill #0

## 2017-08-10 MED FILL — AMLODIPINE BESYLATE 10 MG T: 10 | 30 days supply | Qty: 30 | Fill #0

## 2017-08-10 NOTE — Progress Notes (Signed)
Subjective:  Patient ID: Mary Mathis, female    DOB: 1975-03-03  Age: 42 y.o. MRN: [id number]  CC: Hypertension   HPI Mary Mathis  is a 42 year old female with a history of hypertension, generalized anxiety who presents today for a follow-up visit.  She has been compliant with her antihypertensive and denies adverse effects of her medications. She is surprised that she has been unable to lose weight and has been fact gained 15 pounds in the last 6 months.  Previously tried [employer] but had no success with it.  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  H. pylori breath test performed in the recent past was negative. She does have a history of prediabetes with an A1c of 5.7.    Past Medical History:  Diagnosis Date  . Anxiety   . Cholecystitis   . Fibroids   . Hypertension     Past Surgical History:  Procedure Laterality Date  . CESAREAN SECTION     x2  . CHOLECYSTECTOMY  05/04/2011   Procedure: LAPAROSCOPIC CHOLECYSTECTOMY;  Surgeon: Harl Bowie, MD;  Location: [facility];  Service: General;  Laterality: N/A;  . fiboroids  08/2016   surgery for fibroids  . INTRAOPERATIVE CHOLANGIOGRAM  05/04/2011   Procedure: INTRAOPERATIVE CHOLANGIOGRAM;  Surgeon: Harl Bowie, MD;  Location: [facility];  Service: General;  Laterality: N/A;  . IR ANGIOGRAM PELVIS SELECTIVE OR SUPRASELECTIVE  09/29/2016  . IR ANGIOGRAM PELVIS SELECTIVE OR SUPRASELECTIVE  09/29/2016  . IR ANGIOGRAM SELECTIVE EACH ADDITIONAL VESSEL  09/29/2016  . IR ANGIOGRAM SELECTIVE EACH ADDITIONAL VESSEL  09/29/2016  . IR EMBO TUMOR ORGAN ISCHEMIA INFARCT INC GUIDE ROADMAPPING  09/29/2016  . IR RADIOLOGIST EVAL & MGMT  08/11/2016  . IR RADIOLOGIST EVAL & MGMT  10/21/2016  . IR RADIOLOGIST EVAL & MGMT  05/04/2017  . IR US GUIDE VASC ACCESS LEFT  09/29/2016  . TUBAL LIGATION     with last c-section     Allergies  Allergen Reactions  . Amoxicillin Swelling and Rash    Swelling of the ankles and hands  Has patient had a PCN reaction causing immediate rash, facial/tongue/throat swelling, SOB or lightheadedness with hypotension: Yes Has patient had a PCN reaction causing severe rash involving mucus membranes or skin necrosis: No Has patient had a PCN reaction that required hospitalization Yes Has patient had a PCN reaction occurring within the last 10 years: Yes If all of the above answers are "NO", then may proceed with Cephalosporin use.      Outpatient Medications Prior to Visit  Medication Sig Dispense Refill  . cloNIDine (CATAPRES) 0.1 MG tablet Take 1 tablet (0.1 mg total) by mouth at bedtime. For insomnia and hot flashes 30 tablet 1  . Multiple Vitamin (MULTIVITAMIN WITH MINERALS) TABS tablet Take 1 tablet by mouth daily.    Marland Kitchen amLODipine (NORVASC) 10 MG tablet Take 1 tablet (10 mg total) by mouth daily. 90 tablet 1  . carvedilol (COREG) 25 MG tablet Take 1 tablet (25 mg total) by mouth 2 (two) times daily with a meal. 180 tablet 1  . lisinopril (PRINIVIL,ZESTRIL) 40 MG tablet TAKE 1 TABLET BY MOUTH EVERY DAY IN THE MORNING 30 tablet 6  . ALPRAZolam (XANAX) 0.5 MG tablet Take 1 tablet (0.5 mg total) by mouth as needed for anxiety (for sedation before MRI scan; take 1 hour before scan;  may repeat 15 min before scan). (Patient not taking: Reported on 05/13/2017) 3 tablet 0   Facility-Administered Medications Prior to Visit  Medication Dose Route Frequency Provider Last Rate Last Dose  . gadopentetate dimeglumine (MAGNEVIST) injection 20 mL  20 mL Intravenous Once PRN Penumalli, Vikram R, MD        ROS Review of Systems  Constitutional: Negative for activity change, appetite change and fatigue.  HENT: Negative for congestion, sinus pressure and sore throat.   Eyes: Negative for visual disturbance.  Respiratory: Negative for cough, chest tightness, shortness of breath and  wheezing.   Cardiovascular: Negative for chest pain and palpitations.  Gastrointestinal: Negative for abdominal distention, abdominal pain and constipation.  Endocrine: Negative for polydipsia.  Genitourinary: Negative for dysuria and frequency.  Musculoskeletal: Negative for arthralgias and back pain.  Skin: Negative for rash.  Neurological: Negative for tremors, light-headedness and numbness.  Hematological: Does not bruise/bleed easily.  Psychiatric/Behavioral: Negative for agitation and behavioral problems.    Objective:  BP 131/85   Pulse 73   Temp 97.8 F (36.6 C) (Oral)   Ht 5' 7" (1.702 m)   Wt (!) 339 lb 12.8 oz (154.1 kg)   SpO2 100%   BMI 53.22 kg/m   BP/Weight 08/10/2017 1/61/0960 06/04/4096  Systolic BP 119 147 829  Diastolic BP 85 77 67  Wt. (Lbs) 339.8 335.4 326  BMI 53.22 52.53 51.06      Physical Exam  Constitutional: She is oriented to person, place, and time. She appears well-developed and well-nourished.  Cardiovascular: Normal rate, normal heart sounds and intact distal pulses.  No murmur heard. Pulmonary/Chest: Effort normal and breath sounds normal. She has no wheezes. She has no rales. She exhibits no tenderness.  Abdominal: Soft. Bowel sounds are normal. She exhibits no distension and no mass. There is no tenderness.  Musculoskeletal: Normal range of motion.  Neurological: She is alert and oriented to person, place, and time.  Skin: Skin is warm and dry.  Psychiatric: She has a normal mood and affect.   CMP Latest Ref Rng & Units 04/12/2017 12/24/2016 09/29/2016  Glucose 65 - 99 mg/dL - 102(H) 125(H)  BUN 6 - 20 mg/dL - 8 11  Creatinine 0.44 - 1.00 mg/dL 1.10(H) 0.80 0.88  Sodium 135 - 145 mmol/L - 141 138  Potassium 3.5 - 5.1 mmol/L - 3.6 3.8  Chloride 101 - 111 mmol/L - 104 107  CO2 22 - 32 mmol/L - - 23  Calcium 8.9 - 10.3 mg/dL - - 9.0  Total Protein 6.0 - 8.5 g/dL - - -  Total Bilirubin 0.0 - 1.2 mg/dL - - -  Alkaline Phos 39 - 117 IU/L -  - -  AST 0 - 40 IU/L - - -  ALT 0 - 32 IU/L - - -      Lab Results  Component Value Date   HGBA1C 5.7 08/10/2017    Assessment & Plan:   1. Essential hypertension Controlled Counseled on blood pressure goal of less than 130/80, low-sodium, DASH diet, medication compliance, 150 minutes of moderate intensity exercise per week. Discussed medication compliance, adverse effects. - amLODipine (NORVASC) 10 MG tablet; Take 1 tablet (10 mg total) by mouth daily.  Dispense: 90 tablet; Refill: 1 - carvedilol (COREG) 25 MG tablet; Take 1 tablet (25 mg total) by mouth 2 (two) times daily with a meal.  Dispense: 180 tablet; Refill: 1 - lisinopril (PRINIVIL,ZESTRIL) 40 MG tablet; TAKE 1 TABLET BY MOUTH EVERY DAY IN THE MORNING  Dispense: 90 tablet; Refill: 1 - CMP14+EGFR; Future - Lipid panel; Future  2. Prediabetes A1c of 5.7 Commence Victoza which will also help with weight loss - POCT glycosylated hemoglobin (Hb A1C) - liraglutide (VICTOZA) 18 MG/3ML SOPN; Inject 0.1 mLs (0.6 mg total) into the skin daily.  Dispense: 3 mL; Refill: 6  3. Class 3 severe obesity due to excess calories with serious comorbidity and body mass index (BMI) of 45.0 to 49.9 in adult [facility]) Obesity with hypertension at risk for other medical conditions Counseled on 150 minutes of moderate intensity exercise along with reducing portion sizes - liraglutide (VICTOZA) 18 MG/3ML SOPN; Inject 0.1 mLs (0.6 mg total) into the skin daily.  Dispense: 3 mL; Refill: 6   Meds ordered this encounter  Medications  . amLODipine (NORVASC) 10 MG tablet    Sig: Take 1 tablet (10 mg total) by mouth daily.    Dispense:  90 tablet    Refill:  1  . carvedilol (COREG) 25 MG tablet    Sig: Take 1 tablet (25 mg total) by mouth 2 (two) times daily with a meal.    Dispense:  180 tablet    Refill:  1    Discontinue previous dose  . lisinopril (PRINIVIL,ZESTRIL) 40 MG tablet    Sig: TAKE 1 TABLET BY MOUTH EVERY DAY IN THE MORNING     Dispense:  90 tablet    Refill:  1  . liraglutide (VICTOZA) 18 MG/3ML SOPN    Sig: Inject 0.1 mLs (0.6 mg total) into the skin daily.    Dispense:  3 mL    Refill:  6    Follow-up: Return in about 6 months (around 02/09/2018) for Follow-up of chronic medical conditions.   Charlott Rakes MD

## 2017-08-10 NOTE — Patient Instructions (Signed)
Prediabetes Eating Plan Prediabetes-also called impaired glucose tolerance or impaired fasting glucose-is a condition that causes blood sugar (blood glucose) levels to be higher than normal. Following a healthy diet can help to keep prediabetes under control. It can also help to lower the risk of type 2 diabetes and heart disease, which are increased in people who have prediabetes. Along with regular exercise, a healthy diet:  Promotes weight loss.  Helps to control blood sugar levels.  Helps to improve the way that the body uses insulin.  What do I need to know about this eating plan?  Use the glycemic index (GI) to plan your meals. The index tells you how quickly a food will raise your blood sugar. Choose low-GI foods. These foods take a longer time to raise blood sugar.  Pay close attention to the amount of carbohydrates in the food that you eat. Carbohydrates increase blood sugar levels.  Keep track of how many calories you take in. Eating the right amount of calories will help you to achieve a healthy weight. Losing about 7 percent of your starting weight can help to prevent type 2 diabetes.  You may want to follow a Mediterranean diet. This diet includes a lot of vegetables, lean meats or fish, whole grains, fruits, and healthy oils and fats. What foods can I eat? Grains Whole grains, such as whole-wheat or whole-grain breads, crackers, cereals, and pasta. Unsweetened oatmeal. Bulgur. Barley. Quinoa. Brown rice. Corn or whole-wheat flour tortillas or taco shells. Vegetables Lettuce. Spinach. Peas. Beets. Cauliflower. Cabbage. Broccoli. Carrots. Tomatoes. Squash. Eggplant. Herbs. Peppers. Onions. Cucumbers. Brussels sprouts. Fruits Berries. Bananas. Apples. Oranges. Grapes. Papaya. Mango. Pomegranate. Kiwi. Grapefruit. Cherries. Meats and Other Protein Sources Seafood. Lean meats, such as chicken and turkey or lean cuts of pork and beef. Tofu. Eggs. Nuts. Beans. Dairy Low-fat or  fat-free dairy products, such as yogurt, cottage cheese, and cheese. Beverages Water. Tea. Coffee. Sugar-free or diet soda. Seltzer water. Milk. Milk alternatives, such as soy or almond milk. Condiments Mustard. Relish. Low-fat, low-sugar ketchup. Low-fat, low-sugar barbecue sauce. Low-fat or fat-free mayonnaise. Sweets and Desserts Sugar-free or low-fat pudding. Sugar-free or low-fat ice cream and other frozen treats. Fats and Oils Avocado. Walnuts. Olive oil. The items listed above may not be a complete list of recommended foods or beverages. Contact your dietitian for more options. What foods are not recommended? Grains Refined white flour and flour products, such as bread, pasta, snack foods, and cereals. Beverages Sweetened drinks, such as sweet iced tea and soda. Sweets and Desserts Baked goods, such as cake, cupcakes, pastries, cookies, and cheesecake. The items listed above may not be a complete list of foods and beverages to avoid. Contact your dietitian for more information. This information is not intended to replace advice given to you by your health care provider. Make sure you discuss any questions you have with your health care provider. Document Released: 07/03/2014 Document Revised: 07/25/2015 Document Reviewed: 03/14/2014 Elsevier Interactive Patient Education  2017 [employer]  

## 2017-08-12 ENCOUNTER — Other Ambulatory Visit: Payer: Self-pay | Admitting: Family Medicine

## 2017-08-12 DIAGNOSIS — I10 Essential (primary) hypertension: Secondary | ICD-10-CM

## 2017-08-13 ENCOUNTER — Ambulatory Visit: Payer: Self-pay | Attending: Family Medicine

## 2017-08-13 DIAGNOSIS — I10 Essential (primary) hypertension: Secondary | ICD-10-CM | POA: Insufficient documentation

## 2017-08-13 NOTE — Progress Notes (Signed)
Patient here for lab visit only 

## 2017-08-14 ENCOUNTER — Other Ambulatory Visit: Payer: Self-pay | Admitting: Family Medicine

## 2017-08-14 DIAGNOSIS — I10 Essential (primary) hypertension: Secondary | ICD-10-CM

## 2017-08-14 LAB — CMP14+EGFR
ALBUMIN: 4.4 g/dL (ref 3.5–5.5)
ALK PHOS: 84 IU/L (ref 39–117)
ALT: 22 IU/L (ref 0–32)
AST: 20 IU/L (ref 0–40)
Albumin/Globulin Ratio: 1.6 (ref 1.2–2.2)
BILIRUBIN TOTAL: 0.3 mg/dL (ref 0.0–1.2)
BUN / CREAT RATIO: 13 (ref 9–23)
BUN: 11 mg/dL (ref 6–24)
CHLORIDE: 104 mmol/L (ref 96–106)
CO2: 21 mmol/L (ref 20–29)
Calcium: 9.2 mg/dL (ref 8.7–10.2)
Creatinine, Ser: 0.87 mg/dL (ref 0.57–1.00)
GFR calc Af Amer: 96 mL/min/{1.73_m2} (ref >59)
GFR calc non Af Amer: 84 mL/min/{1.73_m2} (ref >59)
GLOBULIN, TOTAL: 2.7 g/dL (ref 1.5–4.5)
Glucose: 82 mg/dL (ref 65–99)
Potassium: 4.3 mmol/L (ref 3.5–5.2)
SODIUM: 140 mmol/L (ref 134–144)
Total Protein: 7.1 g/dL (ref 6.0–8.5)

## 2017-08-14 LAB — LIPID PANEL
CHOLESTEROL TOTAL: 187 mg/dL (ref 100–199)
Chol/HDL Ratio: 3.2 ratio (ref 0.0–4.4)
HDL: 59 mg/dL (ref >39)
LDL Calculated: 111 mg/dL — ABNORMAL HIGH (ref 0–99)
Triglycerides: 84 mg/dL (ref 0–149)
VLDL CHOLESTEROL CAL: 17 mg/dL (ref 5–40)

## 2017-08-20 ENCOUNTER — Telehealth: Payer: Self-pay

## 2017-08-20 NOTE — Telephone Encounter (Signed)
Patient was called and informed of lab results. 

## 2017-08-20 NOTE — Telephone Encounter (Signed)
-----   Message from Charlott Rakes, MD sent at 08/16/2017  5:29 PM EDT ----- Please inform the patient that labs are normal. Thank you.

## 2017-09-13 ENCOUNTER — Ambulatory Visit: Payer: BLUE CROSS/BLUE SHIELD | Admitting: Diagnostic Neuroimaging

## 2017-09-14 ENCOUNTER — Encounter: Payer: Self-pay | Admitting: Diagnostic Neuroimaging

## 2017-09-17 MED FILL — **VICTOZA 18 MG/3 ML INJECT: 18 | 28 days supply | Qty: 3 | Fill #1

## 2017-10-06 ENCOUNTER — Other Ambulatory Visit: Payer: Self-pay

## 2017-10-06 ENCOUNTER — Encounter (HOSPITAL_COMMUNITY): Payer: Self-pay | Admitting: Emergency Medicine

## 2017-10-06 DIAGNOSIS — I10 Essential (primary) hypertension: Secondary | ICD-10-CM | POA: Insufficient documentation

## 2017-10-06 DIAGNOSIS — R110 Nausea: Secondary | ICD-10-CM | POA: Insufficient documentation

## 2017-10-06 DIAGNOSIS — R05 Cough: Secondary | ICD-10-CM | POA: Insufficient documentation

## 2017-10-06 DIAGNOSIS — Z79899 Other long term (current) drug therapy: Secondary | ICD-10-CM | POA: Insufficient documentation

## 2017-10-06 DIAGNOSIS — R6883 Chills (without fever): Secondary | ICD-10-CM | POA: Insufficient documentation

## 2017-10-06 NOTE — ED Triage Notes (Signed)
Pt from home with c/o chills and nausea. Pt states chills have come and gone for for "months" Pt state her PCP can not figure out what is causing them. Pt states she began feeling this way today when she began feeling "jittery". Pt has normal neuro assessment.

## 2017-10-07 ENCOUNTER — Emergency Department (HOSPITAL_COMMUNITY): Payer: Self-pay

## 2017-10-07 ENCOUNTER — Emergency Department (HOSPITAL_COMMUNITY)
Admission: EM | Admit: 2017-10-07 | Discharge: 2017-10-07 | Disposition: A | Discharge status: Home / Self Care | Payer: Self-pay | Attending: Emergency Medicine | Admitting: Emergency Medicine

## 2017-10-07 DIAGNOSIS — R110 Nausea: Secondary | ICD-10-CM

## 2017-10-07 DIAGNOSIS — R05 Cough: Secondary | ICD-10-CM

## 2017-10-07 DIAGNOSIS — R059 Cough, unspecified: Secondary | ICD-10-CM

## 2017-10-07 LAB — URINALYSIS, ROUTINE W REFLEX MICROSCOPIC
BILIRUBIN URINE: NEGATIVE
Glucose, UA: NEGATIVE mg/dL
Hgb urine dipstick: NEGATIVE
KETONES UR: NEGATIVE mg/dL
Leukocytes, UA: NEGATIVE
Nitrite: NEGATIVE
PH: 6 (ref 5.0–8.0)
PROTEIN: NEGATIVE mg/dL
Specific Gravity, Urine: 1.023 (ref 1.005–1.030)

## 2017-10-07 LAB — CBC
HEMATOCRIT: 38.8 % (ref 36.0–46.0)
HEMOGLOBIN: 12.9 g/dL (ref 12.0–15.0)
MCH: 30.5 pg (ref 26.0–34.0)
MCHC: 33.2 g/dL (ref 30.0–36.0)
MCV: 91.7 fL (ref 78.0–100.0)
Platelets: 257 10*3/uL (ref 150–400)
RBC: 4.23 MIL/uL (ref 3.87–5.11)
RDW: 12.8 % (ref 11.5–15.5)
WBC: 7.9 10*3/uL (ref 4.0–10.5)

## 2017-10-07 LAB — COMPREHENSIVE METABOLIC PANEL
ALT: 20 U/L (ref 0–44)
AST: 22 U/L (ref 15–41)
Albumin: 3.6 g/dL (ref 3.5–5.0)
Alkaline Phosphatase: 70 U/L (ref 38–126)
Anion gap: 8 (ref 5–15)
BUN: 12 mg/dL (ref 6–20)
CHLORIDE: 109 mmol/L (ref 98–111)
CO2: 25 mmol/L (ref 22–32)
Calcium: 8.9 mg/dL (ref 8.9–10.3)
Creatinine, Ser: 0.98 mg/dL (ref 0.44–1.00)
GFR calc Af Amer: >60 mL/min (ref >60)
Glucose, Bld: 103 mg/dL — ABNORMAL HIGH (ref 70–99)
POTASSIUM: 3.7 mmol/L (ref 3.5–5.1)
SODIUM: 142 mmol/L (ref 135–145)
Total Bilirubin: 0.4 mg/dL (ref 0.3–1.2)
Total Protein: 7.4 g/dL (ref 6.5–8.1)

## 2017-10-07 LAB — I-STAT BETA HCG BLOOD, ED (MC, WL, AP ONLY): I-stat hCG, quantitative: <5 m[IU]/mL (ref <5)

## 2017-10-07 LAB — LIPASE, BLOOD: LIPASE: 78 U/L — AB (ref 11–51)

## 2017-10-07 MED ORDER — ONDANSETRON 4 MG PO TBDP
4.0000 mg | ORAL_TABLET | Freq: Once | ORAL | Status: AC
  Administered 2017-10-07: 4 mg via ORAL
  Filled 2017-10-07: qty 1

## 2017-10-07 MED ORDER — ONDANSETRON HCL 4 MG PO TABS: 4.0000 mg | ORAL_TABLET | Freq: Three times a day (TID) | ORAL | 0 refills | Status: DC | PRN

## 2017-10-07 NOTE — ED Provider Notes (Signed)
[facility] DEPT Provider Note   CSN: [id number] Arrival date & time: 10/06/17  2235  Time see 02:00 AM   History   Chief Complaint Chief Complaint  Patient presents with  . Nausea    HPI Mary Mathis is a 42 y.o. female.  HPI patient states about 5 PM today she started getting nauseated.  She drank 2 bottles of water and was cooking for her family and continued to be nauseated.  She states shortly after that she started feeling jittery and shaking on her outside and having cold chills.  She states she had a sharp pain in her left upper chest for 1 second while sitting in the waiting room waiting to be seen.  She denies shortness of breath.  She states both her arm felt weak when she was getting dressed to come to the ED.  She states she has been having similar symptoms for the past 2 years.  She states she gets cold chills inside her body and today she had cold chills outside of her body.  The nausea is new.  She states she has been evaluated by neurology, seen in the ED several times and saw her gynecologist and was found to have fibroids.  She states no one can figure out what is wrong with her.  She also describes some cough that started earlier today and some shortness of breath earlier today without sore throat or rhinorrhea.  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  She also states that she had some fluttering in her upper abdomen.  She states she still has some nausea.  PCP Charlott Rakes, MD   Past Medical History:  Diagnosis Date  . Anxiety   . Cholecystitis   . Fibroids   . Hypertension     Patient Active Problem List   Diagnosis Date Noted  . Uterine fibroid 06/29/2016  . Anxiety 03/10/2016  . ASCUS of cervix with negative high risk HPV 02/11/2016  . Dysmenorrhea 01/20/2016  . Menorrhagia 01/10/2016  . Benign paroxysmal positional vertigo 01/10/2016  . Essential  hypertension 07/06/2014  . Obesity 07/06/2014  . Pedal edema 07/06/2014    Past Surgical History:  Procedure Laterality Date  . CESAREAN SECTION     x2  . CHOLECYSTECTOMY  05/04/2011   Procedure: LAPAROSCOPIC CHOLECYSTECTOMY;  Surgeon: Harl Bowie, MD;  Location: [facility];  Service: General;  Laterality: N/A;  . fiboroids  08/2016   surgery for fibroids  . INTRAOPERATIVE CHOLANGIOGRAM  05/04/2011   Procedure: INTRAOPERATIVE CHOLANGIOGRAM;  Surgeon: Harl Bowie, MD;  Location: [facility];  Service: General;  Laterality: N/A;  . IR ANGIOGRAM PELVIS SELECTIVE OR SUPRASELECTIVE  09/29/2016  . IR ANGIOGRAM PELVIS SELECTIVE OR SUPRASELECTIVE  09/29/2016  . IR ANGIOGRAM SELECTIVE EACH ADDITIONAL VESSEL  09/29/2016  . IR ANGIOGRAM SELECTIVE EACH ADDITIONAL VESSEL  09/29/2016  . IR EMBO TUMOR ORGAN ISCHEMIA INFARCT INC GUIDE ROADMAPPING  09/29/2016  . IR RADIOLOGIST EVAL & MGMT  08/11/2016  . IR RADIOLOGIST EVAL & MGMT  10/21/2016  . IR RADIOLOGIST EVAL & MGMT  05/04/2017  . IR US GUIDE VASC ACCESS LEFT  09/29/2016  . TUBAL LIGATION     with last c-section     OB History    Gravida  4   Para  2   Term  2   Preterm      AB  2   Living  2     SAB      TAB      Ectopic      Multiple      Live Births               Home Medications    Prior to Admission medications   Medication Sig Start Date End Date Taking? Authorizing Provider  amLODipine (NORVASC) 10 MG tablet Take 1 tablet (10 mg total) by mouth daily. 08/10/17  Yes Charlott Rakes, MD  carvedilol (COREG) 25 MG tablet Take 1 tablet (25 mg total) by mouth 2 (two) times daily with a meal. 08/10/17  Yes Newlin, Enobong, MD  liraglutide (VICTOZA) 18 MG/3ML SOPN Inject 0.1 mLs (0.6 mg total) into the skin daily. 08/10/17  Yes Newlin, Charlane Ferretti, MD  lisinopril (PRINIVIL,ZESTRIL) 40 MG tablet TAKE 1 TABLET BY MOUTH EVERY DAY IN THE MORNING 08/10/17  Yes Charlott Rakes, MD  Multiple Vitamin (MULTIVITAMIN WITH MINERALS) TABS  tablet Take 1 tablet by mouth daily.   Yes [provider]  ALPRAZolam Duanne Moron) 0.5 MG tablet Take 1 tablet (0.5 mg total) by mouth as needed for anxiety (for sedation before MRI scan; take 1 hour before scan; may repeat 15 min before scan). Patient not taking: Reported on 05/13/2017 03/17/17   Penumalli, Earlean Polka, MD  cloNIDine (CATAPRES) 0.1 MG tablet Take 1 tablet (0.1 mg total) by mouth at bedtime. For insomnia and hot flashes Patient not taking: Reported on 10/07/2017 02/11/17   Charlott Rakes, MD  ondansetron (ZOFRAN) 4 MG tablet Take 1 tablet (4 mg total) by mouth every 8 (eight) hours as needed for nausea or vomiting. 10/07/17   Rolland Porter, MD    Family History Family History  Problem Relation Age of Onset  . Hypertension Mother   . Diabetes Father   . Hypertension Father     Social History Social History   Tobacco Use  . Smoking status: Never Smoker  . Smokeless tobacco: Never Used  Substance Use Topics  . Alcohol use: No  . Drug use: No  employed   Allergies   Amoxicillin   Review of Systems Review of Systems  All other systems reviewed and are negative.    Physical Exam Updated Vital Signs BP 123/79   Pulse 86   Temp 98.1 F (36.7 C) (Oral)   Resp 15   Ht 5\' 7"  (1.702 m)   Wt (!) 152.2 kg   SpO2 99%   BMI 52.56 kg/m   Vital signs normal    Physical Exam  Constitutional: She is oriented to person, place, and time. She appears well-developed and well-nourished.  Non-toxic appearance. She does not appear ill. No distress.  HENT:  Head: Normocephalic and atraumatic.  Right Ear: External ear normal.  Left Ear: External ear normal.  Nose: Nose normal. No mucosal edema or rhinorrhea.  Mouth/Throat: Oropharynx is clear and moist and mucous membranes are normal. No dental abscesses or uvula swelling.  Eyes: Pupils are equal, round, and reactive to light. Conjunctivae and EOM are normal.  Neck: Normal range of motion and full passive range of motion  without pain. Neck supple.  Cardiovascular: Normal rate, regular rhythm and normal heart sounds. Exam reveals no gallop and no friction rub.  No murmur heard. Pulmonary/Chest: Effort normal and breath sounds normal. No respiratory distress. She has no wheezes. She has no rhonchi. She has no rales. She exhibits no tenderness and no crepitus.  Abdominal: Soft. Normal appearance and bowel sounds are normal. She  exhibits no distension. There is no tenderness. There is no rebound and no guarding.  Musculoskeletal: Normal range of motion. She exhibits no edema or tenderness.  Moves all extremities well.   Neurological: She is alert and oriented to person, place, and time. She has normal strength. No cranial nerve deficit.  Patient has no motor weakness.  Skin: Skin is warm, dry and intact. No rash noted. No erythema. No pallor.  Psychiatric: She has a normal mood and affect. Her speech is normal and behavior is normal. Her mood appears not anxious.  Nursing note and vitals reviewed.    ED Treatments / Results  Labs (all labs ordered are listed, but only abnormal results are displayed) Results for orders placed or performed during the hospital encounter of 10/07/17  Lipase, blood  Result Value Ref Range   Lipase 78 (H) 11 - 51 U/L  Comprehensive metabolic panel  Result Value Ref Range   Sodium 142 135 - 145 mmol/L   Potassium 3.7 3.5 - 5.1 mmol/L   Chloride 109 98 - 111 mmol/L   CO2 25 22 - 32 mmol/L   Glucose, Bld 103 (H) 70 - 99 mg/dL   BUN 12 6 - 20 mg/dL   Creatinine, Ser 0.98 0.44 - 1.00 mg/dL   Calcium 8.9 8.9 - 10.3 mg/dL   Total Protein 7.4 6.5 - 8.1 g/dL   Albumin 3.6 3.5 - 5.0 g/dL   AST 22 15 - 41 U/L   ALT 20 0 - 44 U/L   Alkaline Phosphatase 70 38 - 126 U/L   Total Bilirubin 0.4 0.3 - 1.2 mg/dL   GFR calc non Af Amer >60 >60 mL/min   GFR calc Af Amer >60 >60 mL/min   Anion gap 8 5 - 15  CBC  Result Value Ref Range   WBC 7.9 4.0 - 10.5 K/uL   RBC 4.23 3.87 - 5.11  MIL/uL   Hemoglobin 12.9 12.0 - 15.0 g/dL   HCT 38.8 36.0 - 46.0 %   MCV 91.7 78.0 - 100.0 fL   MCH 30.5 26.0 - 34.0 pg   MCHC 33.2 30.0 - 36.0 g/dL   RDW 12.8 11.5 - 15.5 %   Platelets 257 150 - 400 K/uL  Urinalysis, Routine w reflex microscopic  Result Value Ref Range   Color, Urine YELLOW YELLOW   APPearance HAZY (A) CLEAR   Specific Gravity, Urine 1.023 1.005 - 1.030   pH 6.0 5.0 - 8.0   Glucose, UA NEGATIVE NEGATIVE mg/dL   Hgb urine dipstick NEGATIVE NEGATIVE   Bilirubin Urine NEGATIVE NEGATIVE   Ketones, ur NEGATIVE NEGATIVE mg/dL   Protein, ur NEGATIVE NEGATIVE mg/dL   Nitrite NEGATIVE NEGATIVE   Leukocytes, UA NEGATIVE NEGATIVE   RBC / HPF 0-5 0 - 5 RBC/hpf   WBC, UA 0-5 0 - 5 WBC/hpf   Bacteria, UA RARE (A) NONE SEEN   Squamous Epithelial / LPF 11-20 0 - 5   Mucus PRESENT   I-Stat beta hCG blood, ED  Result Value Ref Range   I-stat hCG, quantitative <5.0 <5 mIU/mL   Comment 3           Laboratory interpretation all normal except insignificantly elevation of the lipase    EKG None  Radiology Dg Chest 2 View  Result Date: 10/07/2017 CLINICAL DATA:  Acute onset of cough and chills. EXAM: CHEST - 2 VIEW COMPARISON:  Chest radiograph performed 02/16/2016 FINDINGS: The lungs are well-aerated and clear. There is no evidence of  focal opacification, pleural effusion or pneumothorax. The heart is normal in size; the mediastinal contour is within normal limits. No acute osseous abnormalities are seen. Clips are noted within the right upper quadrant, reflecting prior cholecystectomy. IMPRESSION: No acute cardiopulmonary process seen. Electronically Signed   By: Garald Balding M.D.   On: 10/07/2017 03:04    Procedures Procedures (including critical care time)  Medications Ordered in ED Medications  ondansetron (ZOFRAN-ODT) disintegrating tablet 4 mg (has no administration in time range)     Initial Impression / Assessment and Plan / ED Course  I have reviewed the  triage vital signs and the nursing notes.  Pertinent labs & imaging results that were available during my care of the patient were reviewed by me and considered in my medical decision making (see chart for details).    Chest x-ray was done due to her complaints of coughing and some shortness of breath earlier today.  Laboratory testing was basically normal, I reexamined her abdomen at time of discharge and it remains and tender, she states sometimes she gets a "fluttering" in her upper abdomen.  Patient was discharged home with nausea medication.  When she arrived in the ED she had a borderline elevated temperature at 99 however the repeat was 98.  We discussed getting a thermometer and writing down her temperature so she can show her doctor and determine if there is any trends.  At this point I do not feel like I have any thing else to offer the patient as far as further evaluation for her symptoms.   Final Clinical Impressions(s) / ED Diagnoses   Final diagnoses:  Cough  Nausea    ED Discharge Orders         Ordered    ondansetron (ZOFRAN) 4 MG tablet  Every 8 hours PRN     10/07/17 0557         Plan discharge  Rolland Porter, MD, Barbette Or, MD 10/07/17 [phone]

## 2017-10-07 NOTE — Discharge Instructions (Addendum)
You had a mild elevation of your temperature in the 99 range and when he arrived in the ED however it resolved without any specific treatment.  Your laboratory testing did not reveal any abnormality, your chest x-ray was also normal.  You may have a early bronchitis, however we do not treat it unless you have had symptoms for at least a week to 10 days.  Use the Zofran for your nausea.  You can take over-the-counter Mucinex DM for coughing if needed.  Recheck if you get a high fever, struggle to breathe, or you feel worse.  Otherwise that your primary care doctor know that you came to the ED, and that you need to be reevaluated for your continued symptoms.

## 2017-11-12 MED FILL — !VICTOZA 18MG/3ML INJECT: 18 | 28 days supply | Qty: 3 | Fill #2

## 2017-11-12 MED FILL — CARVEDILOL 25 MG TABLET: 25 | 30 days supply | Qty: 60 | Fill #1

## 2017-11-12 MED FILL — AMLODIPINE BESYLATE 10 MG T: 10 | 30 days supply | Qty: 30 | Fill #1

## 2017-11-12 MED FILL — LISINOPRIL 40 MG TABLET: 40 | 30 days supply | Qty: 30 | Fill #0

## 2017-11-24 ENCOUNTER — Telehealth: Payer: Self-pay | Admitting: Family Medicine

## 2017-11-24 NOTE — Telephone Encounter (Signed)
Patient called regarding her Liraglutide. She is experiencing itching on her arm (rashes) She would like this medication to be substituted. Please follow up with patient.

## 2017-11-25 NOTE — Telephone Encounter (Signed)
Will route to PCP for review. 

## 2017-11-25 NOTE — Telephone Encounter (Signed)
Victoza was started to assist with weight loss and also due to prediabetes. She can discontinue it if she has a rash; metformin will be the other alternative but weight loss will be minimal compared to Victoza.

## 2017-11-26 ENCOUNTER — Other Ambulatory Visit: Payer: Self-pay

## 2017-11-26 MED ORDER — METFORMIN HCL 500 MG PO TABS: 500.0000 mg | ORAL_TABLET | Freq: Two times a day (BID) | ORAL | 3 refills | Status: DC

## 2017-11-26 MED FILL — metFORMIN HCL 500 MG TABS: 500 | 30 days supply | Qty: 60 | Fill #0

## 2017-11-26 NOTE — Telephone Encounter (Signed)
Patient was called and states that she would like to try the metformin.

## 2017-11-26 NOTE — Telephone Encounter (Signed)
Rx for metformin sent to pharmacy.

## 2017-12-13 MED FILL — LISINOPRIL 40 MG TABLET: 40 | 30 days supply | Qty: 30 | Fill #1

## 2017-12-13 MED FILL — CARVEDILOL 25 MG TABLET: 25 | 30 days supply | Qty: 60 | Fill #2

## 2017-12-13 MED FILL — AMLODIPINE BESYLATE 10 MG T: 10 | 30 days supply | Qty: 30 | Fill #2

## 2018-01-13 MED FILL — LISINOPRIL 40 MG TABLET: 40 | 30 days supply | Qty: 30 | Fill #2

## 2018-01-13 MED FILL — CARVEDILOL 25 MG TABLET: 25 | 30 days supply | Qty: 60 | Fill #3

## 2018-01-13 MED FILL — AMLODIPINE BESYLATE 10 MG T: 10 | 30 days supply | Qty: 30 | Fill #3

## 2018-02-21 IMAGING — CT CT ANGIO CHEST
2 of 6 series · 18 of 36 positions shown · IV contrast (Omni 300)
Comparison: None

CLINICAL DATA: Lightheadedness.  Syncope.

EXAM:
CT ANGIOGRAPHY CHEST WITH CONTRAST
TECHNIQUE: Multidetector CT imaging of the chest was performed using the
standard protocol during bolus administration of intravenous
contrast. Multiplanar CT image reconstructions and MIPs were
obtained to evaluate the vascular anatomy.
CONTRAST:  100 cc of Isovue 370

[Series 7: pe thins · axial · 0.63mm/px · z∈[+1331,+1571]mm · 17 of 270 slices shown]
[im 15/270  lung]
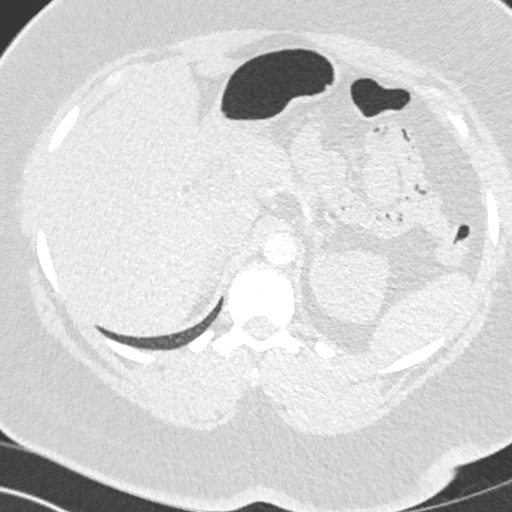
[im 30/270  mediastinal]
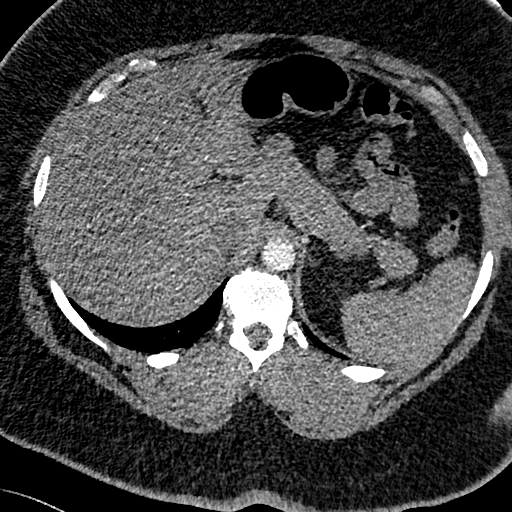
[im 45/270  lung]
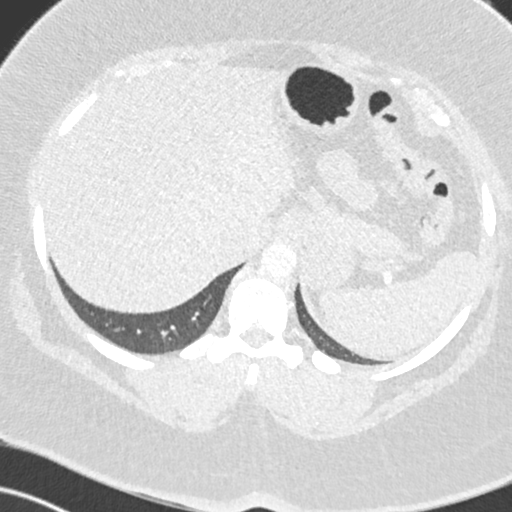
[im 60/270  mediastinal]
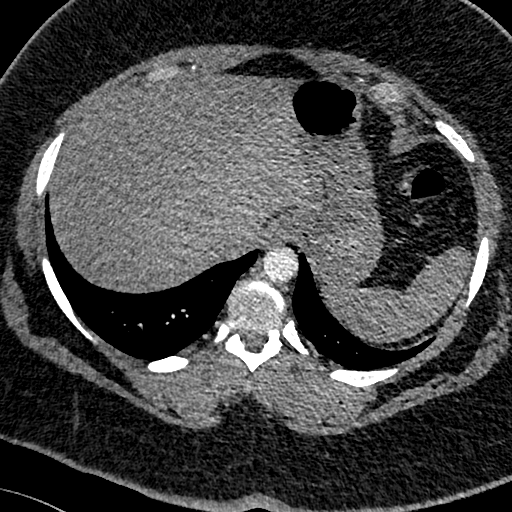
[im 75/270  lung]
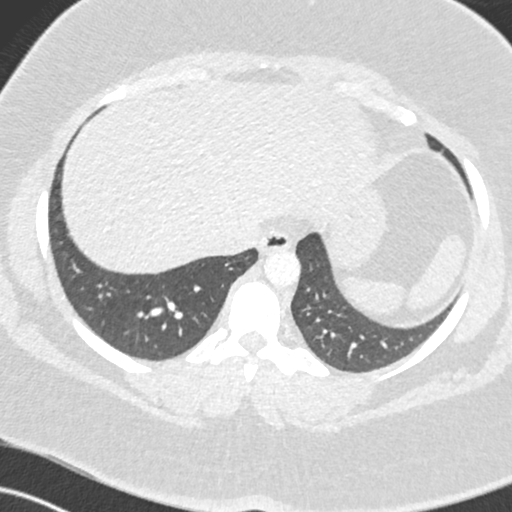
[im 90/270  mediastinal]
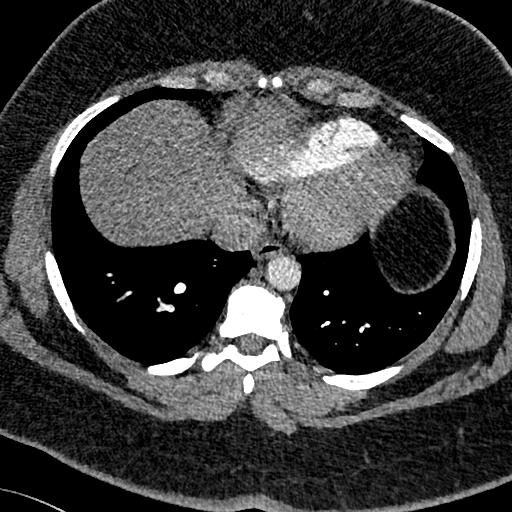
[im 105/270  lung]
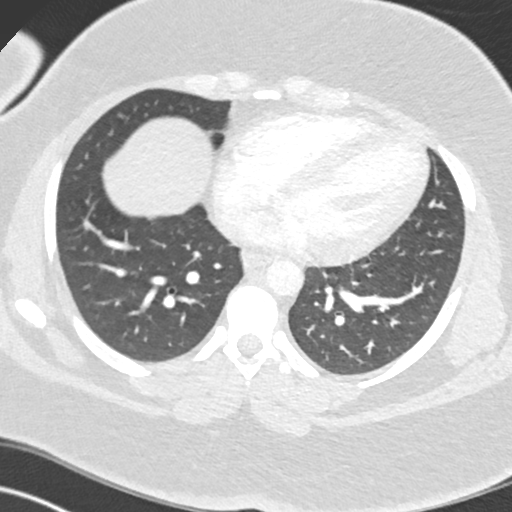
[im 120/270  mediastinal]
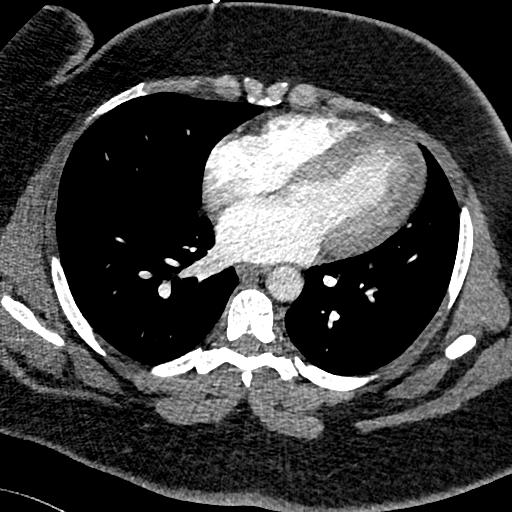
[im 135/270  lung]
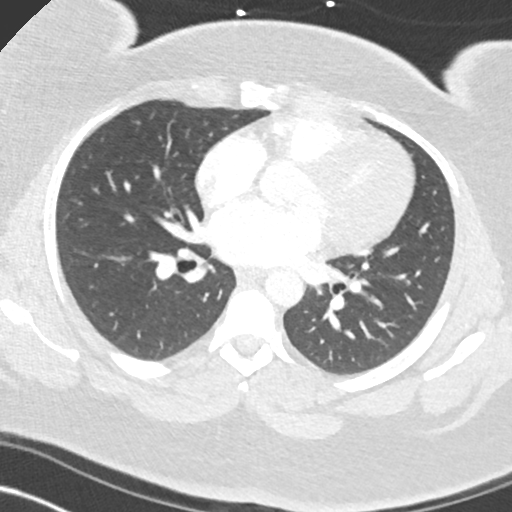
[im 150/270  mediastinal]
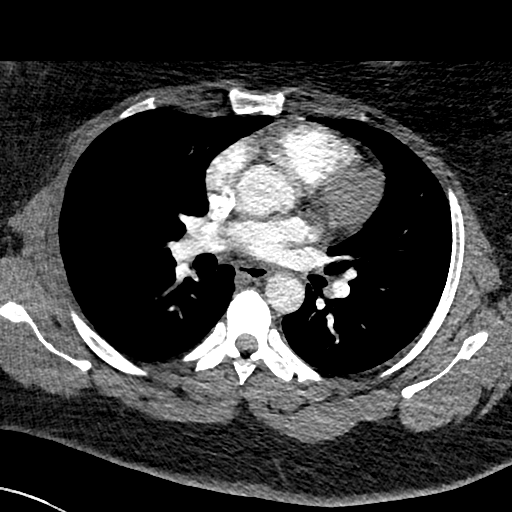
[im 165/270  lung]
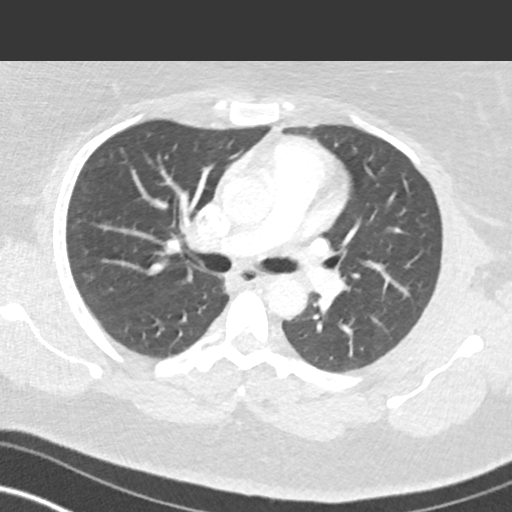
[im 180/270  mediastinal]
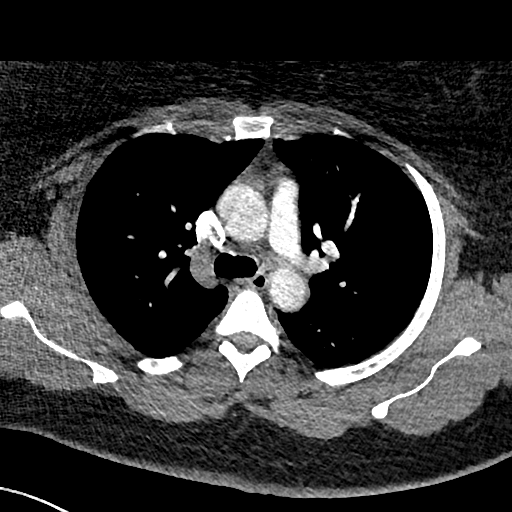
[im 195/270  lung]
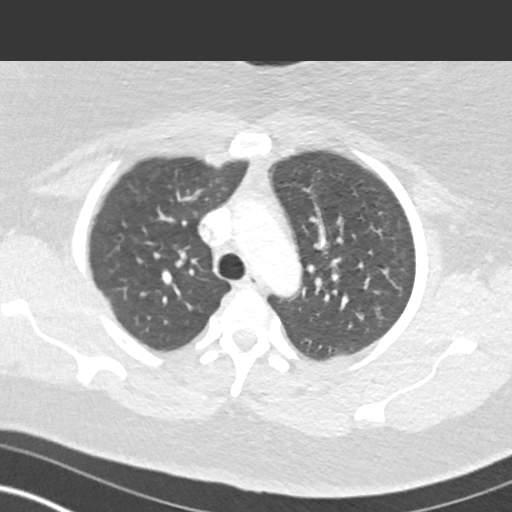
[im 210/270  mediastinal]
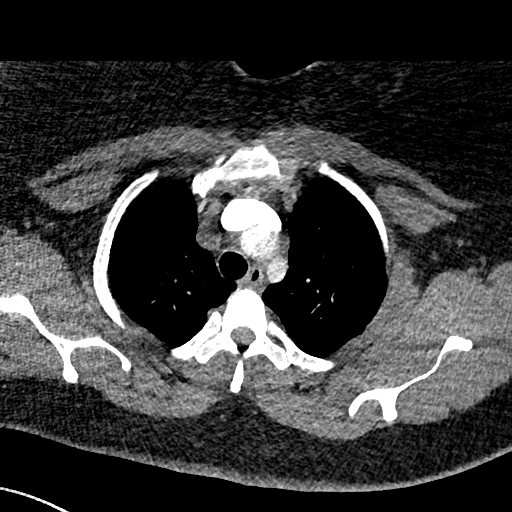
[im 225/270  lung]
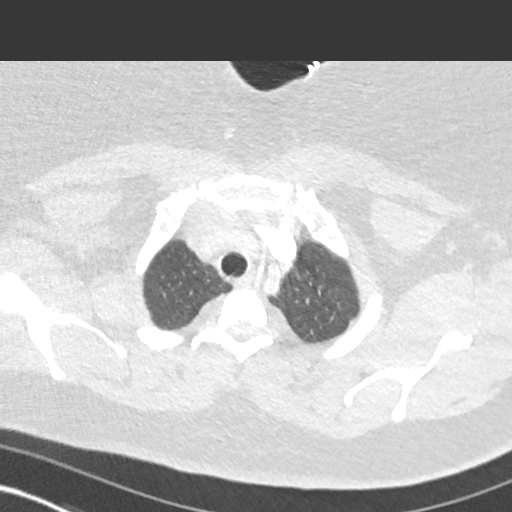
[im 240/270  mediastinal]
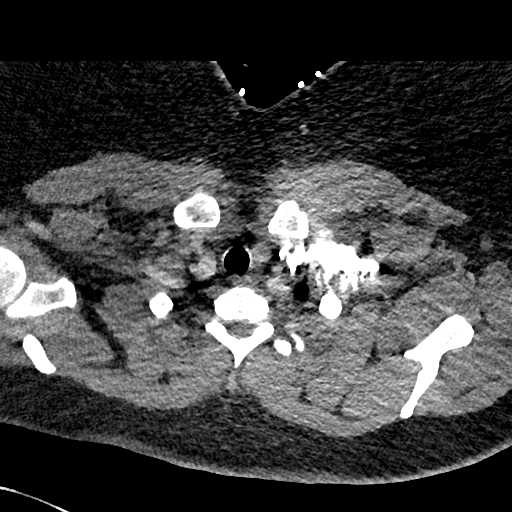
[im 255/270  lung]
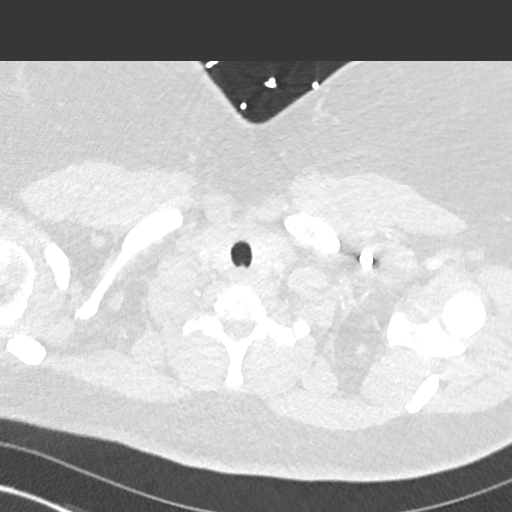

[Series 8: pe 2mm cor · coronal · 0.53mm/px · 1 of 151 slices shown]
[im 76/151  mediastinal]
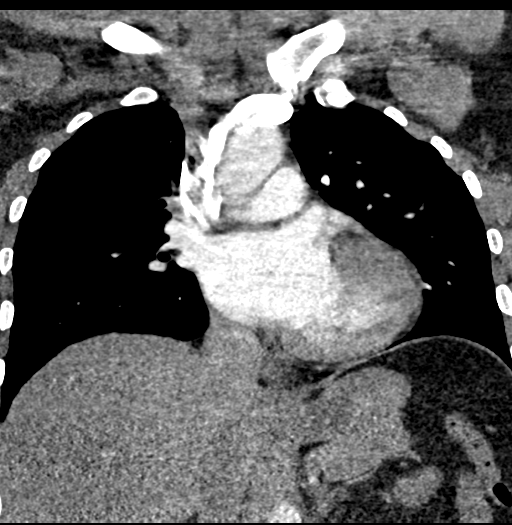

[18 of 36 positions shown; findings below may reference images not displayed]

FINDINGS: Cardiovascular: Satisfactory opacification of the pulmonary arteries
to the segmental level. No evidence of pulmonary embolism. Normal
heart size. No pericardial effusion.

Mediastinum/Nodes: The trachea appears patent and is midline. Normal
appearance of the esophagus. No enlarged mediastinal or hilar
adenopathy.

Lungs/Pleura: No pleural effusion. No airspace consolidation. No
atelectasis. No suspicious pulmonary nodule or mass.

Upper Abdomen: The adrenal glands are normal. The visualized
portions of the liver and spleen are normal. Previous
cholecystectomy.

Musculoskeletal: No chest wall abnormality. No acute or significant
osseous findings.

Review of the MIP images confirms the above findings.
IMPRESSION: 1. No acute pulmonary embolus.
2. No acute cardiopulmonary abnormalities identified.

## 2018-02-21 MED FILL — CARVEDILOL 25 MG TABLET: 25 | 30 days supply | Qty: 60 | Fill #4

## 2018-02-21 MED FILL — LISINOPRIL 40 MG TABLET: 40 | 30 days supply | Qty: 30 | Fill #3

## 2018-02-21 MED FILL — AMLODIPINE BESYLATE 10 MG T: 10 | 30 days supply | Qty: 30 | Fill #4

## 2018-04-21 ENCOUNTER — Encounter (HOSPITAL_COMMUNITY): Payer: Self-pay

## 2018-04-21 ENCOUNTER — Other Ambulatory Visit: Payer: Self-pay

## 2018-04-21 ENCOUNTER — Emergency Department (HOSPITAL_COMMUNITY)
Admission: EM | Admit: 2018-04-21 | Discharge: 2018-04-21 | Disposition: A | Discharge status: Home / Self Care | Payer: Self-pay | Attending: Emergency Medicine | Admitting: Emergency Medicine

## 2018-04-21 DIAGNOSIS — I10 Essential (primary) hypertension: Secondary | ICD-10-CM | POA: Insufficient documentation

## 2018-04-21 DIAGNOSIS — K219 Gastro-esophageal reflux disease without esophagitis: Secondary | ICD-10-CM | POA: Insufficient documentation

## 2018-04-21 DIAGNOSIS — R450 Nervousness: Secondary | ICD-10-CM | POA: Insufficient documentation

## 2018-04-21 DIAGNOSIS — Z79899 Other long term (current) drug therapy: Secondary | ICD-10-CM | POA: Insufficient documentation

## 2018-04-21 LAB — CBC
HEMATOCRIT: 40.1 % (ref 36.0–46.0)
Hemoglobin: 12.9 g/dL (ref 12.0–15.0)
MCH: 30.6 pg (ref 26.0–34.0)
MCHC: 32.2 g/dL (ref 30.0–36.0)
MCV: 95 fL (ref 80.0–100.0)
Platelets: 255 10*3/uL (ref 150–400)
RBC: 4.22 MIL/uL (ref 3.87–5.11)
RDW: 12.4 % (ref 11.5–15.5)
WBC: 8.2 10*3/uL (ref 4.0–10.5)
nRBC: 0 % (ref 0.0–0.2)

## 2018-04-21 LAB — COMPREHENSIVE METABOLIC PANEL
ALK PHOS: 81 U/L (ref 38–126)
ALT: 18 U/L (ref 0–44)
AST: 20 U/L (ref 15–41)
Albumin: 3.8 g/dL (ref 3.5–5.0)
Anion gap: 5 (ref 5–15)
BILIRUBIN TOTAL: 0.4 mg/dL (ref 0.3–1.2)
BUN: 8 mg/dL (ref 6–20)
CALCIUM: 8.6 mg/dL — AB (ref 8.9–10.3)
CHLORIDE: 105 mmol/L (ref 98–111)
CO2: 26 mmol/L (ref 22–32)
CREATININE: 0.88 mg/dL (ref 0.44–1.00)
GFR calc Af Amer: >60 mL/min (ref >60)
Glucose, Bld: 108 mg/dL — ABNORMAL HIGH (ref 70–99)
Potassium: 4 mmol/L (ref 3.5–5.1)
SODIUM: 136 mmol/L (ref 135–145)
Total Protein: 7.5 g/dL (ref 6.5–8.1)

## 2018-04-21 LAB — LIPASE, BLOOD: LIPASE: 47 U/L (ref 11–51)

## 2018-04-21 MED ORDER — LISINOPRIL 20 MG PO TABS
40.0000 mg | ORAL_TABLET | Freq: Once | ORAL | Status: AC
  Administered 2018-04-21: 40 mg via ORAL
  Filled 2018-04-21: qty 2

## 2018-04-21 MED ORDER — CARVEDILOL 25 MG PO TABS
25.0000 mg | ORAL_TABLET | Freq: Once | ORAL | Status: AC
  Administered 2018-04-21: 25 mg via ORAL
  Filled 2018-04-21: qty 1

## 2018-04-21 MED ORDER — AMLODIPINE BESYLATE 5 MG PO TABS
10.0000 mg | ORAL_TABLET | Freq: Once | ORAL | Status: AC
  Administered 2018-04-21: 10 mg via ORAL
  Filled 2018-04-21: qty 2

## 2018-04-21 MED ORDER — OMEPRAZOLE 20 MG PO CPDR: 20.0000 mg | DELAYED_RELEASE_CAPSULE | Freq: Every day | ORAL | 0 refills | Status: DC

## 2018-04-21 MED FILL — LISINOPRIL 40 MG TABLET: 40 | 30 days supply | Qty: 30 | Fill #4

## 2018-04-21 MED FILL — CARVEDILOL 25 MG TABLET: 25 | 30 days supply | Qty: 60 | Fill #5

## 2018-04-21 MED FILL — OMEPRAZOLE 20 MG CAPSULE DR: 20 | 14 days supply | Qty: 14 | Fill #0

## 2018-04-21 MED FILL — AMLODIPINE BESYLATE 10 MG T: 10 | 30 days supply | Qty: 30 | Fill #5

## 2018-04-21 NOTE — ED Notes (Signed)
Delay of ekg PT changing

## 2018-04-21 NOTE — ED Notes (Signed)
Pt reports hx of HTN, reports that she did not take BP medication this am. Pt denies pain at this time. Pt is breathing unlabored and appears comfortable and calm.

## 2018-04-21 NOTE — ED Provider Notes (Signed)
[facility] DEPT Provider Note   CSN: [id number] Arrival date & time: 04/21/18  0908    History   Chief Complaint Chief Complaint  Patient presents with  . Shortness of Breath    HPI Mary Mathis is a 43 y.o. female.     HPI Patient presents with jitteriness and shortness of breath.  Has been going for last 2 years.  States is been worked up for it but no cause has been found.  States she had another episode last night.  States it tends to be worse at night.  States she feels a fluttering this in her upper abdomen that goes to her chest.  States last night she had shortness of breath with it.  Previous cholecystectomy.  States she has had fibroids treated and states that did not change the pain at all.  Has reportedly seen neurology and another specialist for it.  Continues to have episodes.  Has had no chest pain. Past Medical History:  Diagnosis Date  . Anxiety   . Cholecystitis   . Fibroids   . Hypertension     Patient Active Problem List   Diagnosis Date Noted  . Uterine fibroid 06/29/2016  . Anxiety 03/10/2016  . ASCUS of cervix with negative high risk HPV 02/11/2016  . Dysmenorrhea 01/20/2016  . Menorrhagia 01/10/2016  . Benign paroxysmal positional vertigo 01/10/2016  . Essential hypertension 07/06/2014  . Obesity 07/06/2014  . Pedal edema 07/06/2014    Past Surgical History:  Procedure Laterality Date  . CESAREAN SECTION     x2  . CHOLECYSTECTOMY  05/04/2011   Procedure: LAPAROSCOPIC CHOLECYSTECTOMY;  Surgeon: Harl Bowie, MD;  Location: [facility];  Service: General;  Laterality: N/A;  . fiboroids  08/2016   surgery for fibroids  . INTRAOPERATIVE CHOLANGIOGRAM  05/04/2011   Procedure: INTRAOPERATIVE CHOLANGIOGRAM;  Surgeon: Harl Bowie, MD;  Location: [facility];  Service: General;  Laterality: N/A;  . IR ANGIOGRAM PELVIS SELECTIVE OR SUPRASELECTIVE  09/29/2016  . IR ANGIOGRAM PELVIS SELECTIVE OR SUPRASELECTIVE   09/29/2016  . IR ANGIOGRAM SELECTIVE EACH ADDITIONAL VESSEL  09/29/2016  . IR ANGIOGRAM SELECTIVE EACH ADDITIONAL VESSEL  09/29/2016  . IR EMBO TUMOR ORGAN ISCHEMIA INFARCT INC GUIDE ROADMAPPING  09/29/2016  . IR RADIOLOGIST EVAL & MGMT  08/11/2016  . IR RADIOLOGIST EVAL & MGMT  10/21/2016  . IR RADIOLOGIST EVAL & MGMT  05/04/2017  . IR US GUIDE VASC ACCESS LEFT  09/29/2016  . TUBAL LIGATION     with last c-section     OB History    Gravida  4   Para  2   Term  2   Preterm      AB  2   Living  2     SAB      TAB      Ectopic      Multiple      Live Births               Home Medications    Prior to Admission medications   Medication Sig Start Date End Date Taking? Authorizing Provider  amLODipine (NORVASC) 10 MG tablet Take 1 tablet (10 mg total) by mouth daily. 08/10/17  Yes Charlott Rakes, MD  carvedilol (COREG) 25 MG tablet Take 1 tablet (25 mg total) by mouth 2 (two) times daily with a meal. 08/10/17  Yes Newlin, Enobong, MD  lisinopril (PRINIVIL,ZESTRIL) 40 MG tablet TAKE 1 TABLET BY MOUTH EVERY DAY IN  THE MORNING Patient taking differently: Take 40 mg by mouth daily. TAKE 1 TABLET BY MOUTH EVERY DAY IN THE MORNING 08/10/17  Yes Charlott Rakes, MD  ALPRAZolam (XANAX) 0.5 MG tablet Take 1 tablet (0.5 mg total) by mouth as needed for anxiety (for sedation before MRI scan; take 1 hour before scan; may repeat 15 min before scan). Patient not taking: Reported on 05/13/2017 03/17/17   Penumalli, Earlean Polka, MD  cloNIDine (CATAPRES) 0.1 MG tablet Take 1 tablet (0.1 mg total) by mouth at bedtime. For insomnia and hot flashes Patient not taking: Reported on 10/07/2017 02/11/17   Charlott Rakes, MD  metFORMIN (GLUCOPHAGE) 500 MG tablet Take 1 tablet (500 mg total) by mouth 2 (two) times daily with a meal. Patient not taking: Reported on 04/21/2018 11/26/17   Charlott Rakes, MD  Multiple Vitamin (MULTIVITAMIN WITH MINERALS) TABS tablet Take 1 tablet by mouth daily.    [provider]  omeprazole (PRILOSEC) 20 MG capsule Take 1 capsule (20 mg total) by mouth daily. 04/21/18   Davonna Belling, MD  ondansetron (ZOFRAN) 4 MG tablet Take 1 tablet (4 mg total) by mouth every 8 (eight) hours as needed for nausea or vomiting. Patient not taking: Reported on 04/21/2018 10/07/17   Rolland Porter, MD    Family History Family History  Problem Relation Age of Onset  . Hypertension Mother   . Diabetes Father   . Hypertension Father     Social History Social History   Tobacco Use  . Smoking status: Never Smoker  . Smokeless tobacco: Never Used  Substance Use Topics  . Alcohol use: No  . Drug use: No     Allergies   Amoxicillin   Review of Systems Review of Systems  Constitutional: Negative for appetite change.  HENT: Negative for congestion.   Respiratory: Positive for shortness of breath.   Cardiovascular: Negative for chest pain and palpitations.  Gastrointestinal: Positive for abdominal pain.  Genitourinary: Negative for flank pain.  Musculoskeletal: Negative for back pain.  Skin: Negative for rash.  Neurological: Negative for weakness.  Psychiatric/Behavioral: Negative for confusion.     Physical Exam Updated Vital Signs BP (!) 177/109 (BP Location: Right Arm)   Pulse 69   Temp 98.1 F (36.7 C) (Oral)   Resp 20   Ht 5\' 7"  (1.702 m)   Wt (!) 147.9 kg   SpO2 100%   BMI 51.06 kg/m   Physical Exam Constitutional:      Comments: Patient is obese  HENT:     Head: Atraumatic.  Neck:     Musculoskeletal: Neck supple.  Cardiovascular:     Rate and Rhythm: Regular rhythm.  Pulmonary:     Breath sounds: Normal breath sounds.  Chest:     Chest wall: No tenderness.  Abdominal:     Tenderness: There is no abdominal tenderness.  Musculoskeletal:     Right lower leg: No edema.     Left lower leg: No edema.  Skin:    General: Skin is warm.     Capillary Refill: Capillary refill takes less than 2 seconds.  Neurological:     General: No  focal deficit present.     Mental Status: She is alert.      ED Treatments / Results  Labs (all labs ordered are listed, but only abnormal results are displayed) Labs Reviewed  COMPREHENSIVE METABOLIC PANEL - Abnormal; Notable for the following components:      Result Value   Glucose, Bld 108 (*)  Calcium 8.6 (*)    All other components within normal limits  LIPASE, BLOOD  CBC    EKG EKG Interpretation  Date/Time:  Thursday April 21 2018 09:27:41 EST Ventricular Rate:  74 PR Interval:    QRS Duration: 99 QT Interval:  407 QTC Calculation: 452 R Axis:   40 Text Interpretation:  Sinus rhythm Confirmed by Davonna Belling [phone]) on 04/21/2018 9:37:27 AM   Radiology No results found.  Procedures Procedures (including critical care time)  Medications Ordered in ED Medications  lisinopril (PRINIVIL,ZESTRIL) tablet 40 mg (40 mg Oral Given 04/21/18 1246)  carvedilol (COREG) tablet 25 mg (25 mg Oral Given 04/21/18 1246)  amLODipine (NORVASC) tablet 10 mg (10 mg Oral Given 04/21/18 1246)     Initial Impression / Assessment and Plan / ED Course  I have reviewed the triage vital signs and the nursing notes.  Pertinent labs & imaging results that were available during my care of the patient were reviewed by me and considered in my medical decision making (see chart for details).        Patient with a fluttery feeling in chest.  Reportedly comes from abdomen.  Has been having for 2 years.  States it is been worked up extensively.  Reviewing some records lipase was mildly elevated last time.  Rechecked and more reassuring.  Does have hypertension but reportedly has not taken his other medicines for a couple days.  Reportedly has medicines.  Requested dose of medicines here and was given to patient.  Fluttering is worse after she lays down.  Could be a component of GERD to this.  Will treat with a proton pump inhibitor.  Discharge home.  Final Clinical Impressions(s) / ED  Diagnoses   Final diagnoses:  Gastroesophageal reflux disease, esophagitis presence not specified    ED Discharge Orders         Ordered    omeprazole (PRILOSEC) 20 MG capsule  Daily     04/21/18 1224           Davonna Belling, MD 04/21/18 1247

## 2018-04-21 NOTE — ED Triage Notes (Signed)
Pt reports that she woke out of her sleep last night feeling "jittery" and short of breath. Pt reports that she went to work this morning and the shortness of breath and the feeling of being "jittery" worsened. Pt denies chest pain at this time.

## 2018-05-06 MED FILL — OMEPRAZOLE 20 MG CAPSULE DR: 20 | 15 days supply | Qty: 30 | Fill #0

## 2018-05-13 ENCOUNTER — Other Ambulatory Visit: Payer: Self-pay | Admitting: Family Medicine

## 2018-05-13 DIAGNOSIS — I10 Essential (primary) hypertension: Secondary | ICD-10-CM

## 2018-05-16 ENCOUNTER — Other Ambulatory Visit: Payer: Self-pay | Admitting: Family Medicine

## 2018-05-16 DIAGNOSIS — I10 Essential (primary) hypertension: Secondary | ICD-10-CM

## 2018-05-16 MED FILL — LISINOPRIL 40 MG TABLET: 40 | 30 days supply | Qty: 30 | Fill #5

## 2018-05-20 MED FILL — AMLODIPINE BESYLATE 10 MG T: 10 | 30 days supply | Qty: 30 | Fill #0

## 2018-05-20 MED FILL — CARVEDILOL 25 MG TABLET: 25 | 30 days supply | Qty: 60 | Fill #0

## 2018-06-22 ENCOUNTER — Other Ambulatory Visit: Payer: Self-pay | Admitting: Family Medicine

## 2018-06-22 DIAGNOSIS — I10 Essential (primary) hypertension: Secondary | ICD-10-CM

## 2018-06-22 MED FILL — AMLODIPINE BESYLATE 10 MG T: 10 | 30 days supply | Qty: 30 | Fill #1

## 2018-06-22 MED FILL — LISINOPRIL 40 MG TABLET: 40 | 90 days supply | Qty: 90 | Fill #0

## 2018-06-22 MED FILL — CARVEDILOL 25 MG TABLET: 25 | 30 days supply | Qty: 60 | Fill #1

## 2018-07-12 MED FILL — OMEPRAZOLE 20 MG CAPSULE DR: 20 | 30 days supply | Qty: 30 | Fill #0

## 2018-07-27 MED FILL — AMLODIPINE BESYLATE 10 MG T: 10 | 30 days supply | Qty: 30 | Fill #2

## 2018-08-09 ENCOUNTER — Encounter: Payer: Self-pay | Admitting: *Deleted

## 2018-08-18 ENCOUNTER — Other Ambulatory Visit: Payer: Self-pay | Admitting: Family Medicine

## 2018-08-18 DIAGNOSIS — I10 Essential (primary) hypertension: Secondary | ICD-10-CM

## 2018-08-29 MED FILL — CARVEDILOL 25 MG TABLET: 25 | 30 days supply | Qty: 60 | Fill #2

## 2018-08-30 MED FILL — AMLODIPINE BESYLATE 10 MG T: 10 | 30 days supply | Qty: 30 | Fill #3

## 2018-09-27 ENCOUNTER — Other Ambulatory Visit: Payer: Self-pay | Admitting: Family Medicine

## 2018-09-27 DIAGNOSIS — I10 Essential (primary) hypertension: Secondary | ICD-10-CM

## 2018-09-27 MED FILL — CARVEDILOL 25 MG TABLET: 25 | 30 days supply | Qty: 60 | Fill #3

## 2018-09-27 MED FILL — AMLODIPINE BESYLATE 10 MG T: 10 | 30 days supply | Qty: 30 | Fill #4

## 2018-11-01 ENCOUNTER — Other Ambulatory Visit: Payer: Self-pay | Admitting: Family Medicine

## 2018-11-01 DIAGNOSIS — I10 Essential (primary) hypertension: Secondary | ICD-10-CM

## 2018-11-01 MED FILL — CARVEDILOL 25 MG TABLET: 25 | 30 days supply | Qty: 60 | Fill #4

## 2018-11-01 MED FILL — AMLODIPINE BESYLATE 10 MG T: 10 | 30 days supply | Qty: 30 | Fill #5

## 2018-12-02 ENCOUNTER — Other Ambulatory Visit: Payer: Self-pay | Admitting: Family Medicine

## 2018-12-02 DIAGNOSIS — I10 Essential (primary) hypertension: Secondary | ICD-10-CM

## 2018-12-02 MED FILL — CARVEDILOL 25 MG TABLET: 25 | 30 days supply | Qty: 60 | Fill #5

## 2018-12-05 ENCOUNTER — Telehealth: Payer: Self-pay | Admitting: Family Medicine

## 2018-12-05 ENCOUNTER — Other Ambulatory Visit: Payer: Self-pay

## 2018-12-05 ENCOUNTER — Other Ambulatory Visit: Payer: Self-pay | Admitting: Family Medicine

## 2018-12-05 DIAGNOSIS — I10 Essential (primary) hypertension: Secondary | ICD-10-CM

## 2018-12-05 MED ORDER — AMLODIPINE BESYLATE 10 MG PO TABS: 10.0000 mg | ORAL_TABLET | Freq: Every day | ORAL | 0 refills | Status: DC

## 2018-12-05 MED ORDER — LISINOPRIL 40 MG PO TABS: 40.0000 mg | ORAL_TABLET | Freq: Every day | ORAL | 0 refills | Status: DC

## 2018-12-05 MED ORDER — CARVEDILOL 25 MG PO TABS: ORAL_TABLET | ORAL | 0 refills | Status: DC

## 2018-12-05 MED FILL — AMLODIPINE BESYLATE 10 MG T: 10 | 30 days supply | Qty: 30 | Fill #0

## 2018-12-05 MED FILL — LISINOPRIL 40 MG TABLET: 40 | 30 days supply | Qty: 30 | Fill #0

## 2018-12-05 NOTE — Telephone Encounter (Signed)
Medication has been refilled for 30 days and sent to pharmacy.

## 2018-12-05 NOTE — Telephone Encounter (Signed)
Ok to fill before appointment.

## 2018-12-05 NOTE — Telephone Encounter (Signed)
Patient has an appointment at 3:50 on the 26th and needs BP meds to cover until then please call patient at [phone]

## 2018-12-05 NOTE — Telephone Encounter (Signed)
Yes. Thanks 

## 2018-12-26 ENCOUNTER — Encounter: Payer: Self-pay | Admitting: Family Medicine

## 2018-12-26 ENCOUNTER — Ambulatory Visit: Payer: Self-pay | Attending: Family Medicine | Admitting: Family Medicine

## 2018-12-26 ENCOUNTER — Other Ambulatory Visit: Payer: Self-pay

## 2018-12-26 DIAGNOSIS — I10 Essential (primary) hypertension: Secondary | ICD-10-CM

## 2018-12-26 MED ORDER — LISINOPRIL 40 MG PO TABS: 40.0000 mg | ORAL_TABLET | Freq: Every day | ORAL | 1 refills | Status: DC

## 2018-12-26 MED ORDER — AMLODIPINE BESYLATE 10 MG PO TABS: 10.0000 mg | ORAL_TABLET | Freq: Every day | ORAL | 1 refills | Status: DC

## 2018-12-26 MED ORDER — CARVEDILOL 25 MG PO TABS: ORAL_TABLET | ORAL | 1 refills | Status: DC

## 2018-12-26 MED FILL — CARVEDILOL 25 MG TABLET: 25 | 90 days supply | Qty: 180 | Fill #0

## 2018-12-26 NOTE — Patient Instructions (Signed)

## 2018-12-26 NOTE — Progress Notes (Signed)
Subjective:  Patient ID: Mary Mathis, female    DOB: 11-09-1975  Age: 43 y.o. MRN: [id number]  CC: Hypertension   HPI Mary Mathis is a 43 year old female with a history of hypertension, morbid obesity, prediabetes (A1c 5.5 from care everywhere) who presents today for follow-up visit and was last seen 1 year ago. Since her last visit she has been to see GI at [facility] where she was diagnosed with small intestinal bacterial overgrowth and reports doing well with regards to her GI symptoms ever since she modified her diet. She is also followed by the weight loss clinic and is working towards bariatric surgery.  She is currently exercising and modifying her diet. Labs from [facility] reviewed from this month-normal. She has no additional concerns today.  Past Medical History:  Diagnosis Date  . Anxiety   . Cholecystitis   . Fibroids   . Hypertension     Past Surgical History:  Procedure Laterality Date  . CESAREAN SECTION     x2  . CHOLECYSTECTOMY  05/04/2011   Procedure: LAPAROSCOPIC CHOLECYSTECTOMY;  Surgeon: Harl Bowie, MD;  Location: [facility];  Service: General;  Laterality: N/A;  . fiboroids  08/2016   surgery for fibroids  . INTRAOPERATIVE CHOLANGIOGRAM  05/04/2011   Procedure: INTRAOPERATIVE CHOLANGIOGRAM;  Surgeon: Harl Bowie, MD;  Location: [facility];  Service: General;  Laterality: N/A;  . IR ANGIOGRAM PELVIS SELECTIVE OR SUPRASELECTIVE  09/29/2016  . IR ANGIOGRAM PELVIS SELECTIVE OR SUPRASELECTIVE  09/29/2016  . IR ANGIOGRAM SELECTIVE EACH ADDITIONAL VESSEL  09/29/2016  . IR ANGIOGRAM SELECTIVE EACH ADDITIONAL VESSEL  09/29/2016  . IR EMBO TUMOR ORGAN ISCHEMIA INFARCT INC GUIDE ROADMAPPING  09/29/2016  . IR RADIOLOGIST EVAL & MGMT  08/11/2016  . IR RADIOLOGIST EVAL & MGMT  10/21/2016  . IR RADIOLOGIST EVAL & MGMT  05/04/2017  . IR US GUIDE VASC ACCESS LEFT  09/29/2016  . TUBAL LIGATION     with last c-section     Family History  Problem Relation Age of Onset  . Hypertension Mother   . Diabetes Father   . Hypertension Father     Allergies  Allergen Reactions  . Amoxicillin Swelling and Rash    Swelling of the ankles and hands  Has patient had a PCN reaction causing immediate rash, facial/tongue/throat swelling, SOB or lightheadedness with hypotension: Yes Has patient had a PCN reaction causing severe rash involving mucus membranes or skin necrosis: No Has patient had a PCN reaction that required hospitalization Yes Has patient had a PCN reaction occurring within the last 10 years: Yes If all of the above answers are "NO", then may proceed with Cephalosporin use.     Outpatient Medications Prior to Visit  Medication Sig Dispense Refill  . ALPRAZolam (XANAX) 0.5 MG tablet Take 1 tablet (0.5 mg total) by mouth as needed for anxiety (for sedation before MRI scan; take 1 hour before scan; may repeat 15 min before scan). (Patient not taking: Reported on 05/13/2017) 3 tablet 0  . cloNIDine (CATAPRES) 0.1 MG tablet Take 1 tablet (0.1 mg total) by mouth at bedtime. For insomnia and hot flashes (Patient not taking: Reported on 10/07/2017) 30 tablet 1  . Multiple Vitamin (MULTIVITAMIN WITH MINERALS) TABS tablet Take 1 tablet by mouth daily.    Marland Kitchen omeprazole (PRILOSEC) 20 MG capsule Take 1 capsule (20 mg total) by mouth daily. 14 capsule 0  . ondansetron (ZOFRAN) 4 MG tablet Take 1 tablet (  4 mg total) by mouth every 8 (eight) hours as needed for nausea or vomiting. (Patient not taking: Reported on 04/21/2018) 10 tablet 0  . amLODipine (NORVASC) 10 MG tablet Take 1 tablet (10 mg total) by mouth daily. 30 tablet 0  . carvedilol (COREG) 25 MG tablet TAKE 1 TABLET BY MOUTH 2 TIMES DAILY WITH A MEAL. 60 tablet 0  . lisinopril (ZESTRIL) 40 MG tablet Take 1 tablet (40 mg total) by mouth daily. 30 tablet 0  . metFORMIN (GLUCOPHAGE) 500 MG tablet Take 1 tablet (500 mg total) by mouth 2 (two) times daily with a meal.  (Patient not taking: Reported on 04/21/2018) 60 tablet 3   Facility-Administered Medications Prior to Visit  Medication Dose Route Frequency Provider Last Rate Last Dose  . gadopentetate dimeglumine (MAGNEVIST) injection 20 mL  20 mL Intravenous Once PRN Penumalli, Vikram R, MD         ROS Review of Systems  Constitutional: Negative for activity change, appetite change and fatigue.  HENT: Negative for congestion, sinus pressure and sore throat.   Eyes: Negative for visual disturbance.  Respiratory: Negative for cough, chest tightness, shortness of breath and wheezing.   Cardiovascular: Negative for chest pain and palpitations.  Gastrointestinal: Negative for abdominal distention, abdominal pain and constipation.  Endocrine: Negative for polydipsia.  Genitourinary: Negative for dysuria and frequency.  Musculoskeletal: Negative for arthralgias and back pain.  Skin: Negative for rash.  Neurological: Negative for tremors, light-headedness and numbness.  Hematological: Does not bruise/bleed easily.  Psychiatric/Behavioral: Negative for agitation and behavioral problems.    Objective:  BP 116/82 (BP Location: Left Arm, Patient Position: Sitting, Cuff Size: Large)   Pulse 73   Temp 98.2 F (36.8 C) (Oral)   Resp 18   Ht 5\' 7"  (1.702 m)   Wt (!) 344 lb (156 kg)   LMP 12/24/2018   SpO2 99%   BMI 53.88 kg/m   BP/Weight 12/26/2018 [id number]  Systolic BP 99991111 [id number]  Diastolic BP 82 0000000 90  Wt. (Lbs) 344 326 -  BMI 53.88 51.06 52.56      Physical Exam Constitutional:      Appearance: She is well-developed. She is obese.  Neck:     Vascular: No JVD.  Cardiovascular:     Rate and Rhythm: Normal rate.     Heart sounds: Normal heart sounds. No murmur.  Pulmonary:     Effort: Pulmonary effort is normal.     Breath sounds: Normal breath sounds. No wheezing or rales.  Chest:     Chest wall: No tenderness.  Abdominal:     General: Bowel sounds are normal. There is no  distension.     Palpations: Abdomen is soft. There is no mass.     Tenderness: There is no abdominal tenderness.  Musculoskeletal: Normal range of motion.     Right lower leg: No edema.     Left lower leg: No edema.  Neurological:     Mental Status: She is alert and oriented to person, place, and time.  Psychiatric:        Mood and Affect: Mood normal.     CMP Latest Ref Rng & Units 04/21/2018 10/07/2017 08/13/2017  Glucose 70 - 99 mg/dL 108(H) 103(H) 82  BUN 6 - 20 mg/dL 8 12 11   Creatinine 0.44 - 1.00 mg/dL 0.88 0.98 0.87  Sodium 135 - 145 mmol/L 136 142 140  Potassium 3.5 - 5.1 mmol/L 4.0 3.7 4.3  Chloride 98 - 111 mmol/L  105 109 104  CO2 22 - 32 mmol/L 26 25 21   Calcium 8.9 - 10.3 mg/dL 8.6(L) 8.9 9.2  Total Protein 6.5 - 8.1 g/dL 7.5 7.4 7.1  Total Bilirubin 0.3 - 1.2 mg/dL 0.4 0.4 0.3  Alkaline Phos 38 - 126 U/L 81 70 84  AST 15 - 41 U/L 20 22 20   ALT 0 - 44 U/L 18 20 22     Lipid Panel     Component Value Date/Time   CHOL 187 08/13/2017 1046   TRIG 84 08/13/2017 1046   HDL 59 08/13/2017 1046   CHOLHDL 3.2 08/13/2017 1046   LDLCALC 111 (H) 08/13/2017 1046    CBC    Component Value Date/Time   WBC 8.2 04/21/2018 1009   RBC 4.22 04/21/2018 1009   HGB 12.9 04/21/2018 1009   HGB 11.7 06/05/2016 1223   HCT 40.1 04/21/2018 1009   HCT 36.4 06/05/2016 1223   PLT 255 04/21/2018 1009   PLT 241 06/05/2016 1223   MCV 95.0 04/21/2018 1009   MCV 86 06/05/2016 1223   MCH 30.6 04/21/2018 1009   MCHC 32.2 04/21/2018 1009   RDW 12.4 04/21/2018 1009   RDW 14.2 06/05/2016 1223   LYMPHSABS 2.3 12/24/2016 0040   LYMPHSABS 2.5 06/05/2016 1223   MONOABS 0.9 12/24/2016 0040   EOSABS 0.3 12/24/2016 0040   EOSABS 0.3 06/05/2016 1223   BASOSABS 0.0 12/24/2016 0040   BASOSABS 0.0 06/05/2016 1223    Lab Results  Component Value Date   HGBA1C 5.7 08/10/2017    Assessment & Plan:   1. Essential hypertension Controlled Counseled on blood pressure goal of less than 130/80,  low-sodium, DASH diet, medication compliance, 150 minutes of moderate intensity exercise per week. Discussed medication compliance, adverse effects. - amLODipine (NORVASC) 10 MG tablet; Take 1 tablet (10 mg total) by mouth daily.  Dispense: 90 tablet; Refill: 1 - carvedilol (COREG) 25 MG tablet; TAKE 1 TABLET BY MOUTH 2 TIMES DAILY WITH A MEAL.  Dispense: 180 tablet; Refill: 1 - lisinopril (ZESTRIL) 40 MG tablet; Take 1 tablet (40 mg total) by mouth daily.  Dispense: 90 tablet; Refill: 1  2. Morbid obesity ([place]) Counseled on weight loss modalities including exercise and caloric restriction which she is currently undergoing Followed by weight loss clinic at request [facility] and is working to his bariatric surgery   Health Care Maintenance: Due for Pap smear in 01/2019 Meds ordered this encounter  Medications  . amLODipine (NORVASC) 10 MG tablet    Sig: Take 1 tablet (10 mg total) by mouth daily.    Dispense:  90 tablet    Refill:  1  . carvedilol (COREG) 25 MG tablet    Sig: TAKE 1 TABLET BY MOUTH 2 TIMES DAILY WITH A MEAL.    Dispense:  180 tablet    Refill:  1  . lisinopril (ZESTRIL) 40 MG tablet    Sig: Take 1 tablet (40 mg total) by mouth daily.    Dispense:  90 tablet    Refill:  1    Follow-up: Return in about 6 months (around 06/26/2019) for Hypertension.       Charlott Rakes, MD, FAAFP. [facility] [place], [place]   12/26/2018, 4:38 PM

## 2019-01-03 MED FILL — LISINOPRIL 40 MG TABLET: 40 | 90 days supply | Qty: 90 | Fill #0

## 2019-01-03 MED FILL — AMLODIPINE BESYLATE 10 MG T: 10 | 90 days supply | Qty: 90 | Fill #0

## 2019-04-10 MED FILL — LISINOPRIL 40 MG TABLET: 40 | 90 days supply | Qty: 90 | Fill #1

## 2019-04-10 MED FILL — CARVEDILOL 25 MG TABLET: 25 | 90 days supply | Qty: 180 | Fill #1

## 2019-04-10 MED FILL — AMLODIPINE BESYLATE 10 MG T: 10 | 90 days supply | Qty: 90 | Fill #1

## 2019-06-12 ENCOUNTER — Ambulatory Visit
Admission: EM | Admit: 2019-06-12 | Discharge: 2019-06-12 | Disposition: A | Discharge status: Home / Self Care | Payer: BLUE CROSS/BLUE SHIELD | Attending: Physician Assistant | Admitting: Physician Assistant

## 2019-06-12 DIAGNOSIS — K5900 Constipation, unspecified: Secondary | ICD-10-CM | POA: Diagnosis not present

## 2019-06-12 MED ORDER — MAGNESIUM CITRATE PO SOLN: 0.5000 | Freq: Once | ORAL | 0 refills | Status: AC

## 2019-06-12 MED ORDER — MINERAL OIL RE ENEM: 1.0000 | ENEMA | Freq: Once | RECTAL | 2 refills | Status: AC

## 2019-06-12 NOTE — Discharge Instructions (Signed)
Mineral oil enema as directed. Keep hydrated, urine should be clear to pale yellow in color. Avoid drinking the water all at once. If still without bowel movement after 1-2 days, can try mag citrate as directed. Follow up with PCP if symptoms not improving.

## 2019-06-12 NOTE — ED Provider Notes (Signed)
[facility]    CSN: [id number] Arrival date & time: 06/12/19  1135      History   Chief Complaint Chief Complaint  Patient presents with  . Fecal Impaction    HPI Mary Mathis is a 44 y.o. female.   44 year old female comes in for 1 week history of constipation. She is preparing for gastric sleeve scheduled for 06/20/2019. 2 weeks ago, started adding protein shakes to her diet and still eats solid food. 1 week ago, started having hard stools, and started Senakot BID daily without relief. Since starting medication, she has had increase in gas, and now with abdominal cramping. Still passing flatus. She tried an otc suppository yesterday without relief. States tried miralax in the past prior to current symptom onset without good relief, so avoided using this time.       Past Medical History:  Diagnosis Date  . Anxiety   . Cholecystitis   . Fibroids   . Hypertension     Patient Active Problem List   Diagnosis Date Noted  . Uterine fibroid 06/29/2016  . Anxiety 03/10/2016  . ASCUS of cervix with negative high risk HPV 02/11/2016  . Dysmenorrhea 01/20/2016  . Menorrhagia 01/10/2016  . Benign paroxysmal positional vertigo 01/10/2016  . Essential hypertension 07/06/2014  . Obesity 07/06/2014  . Pedal edema 07/06/2014    Past Surgical History:  Procedure Laterality Date  . CESAREAN SECTION     x2  . CHOLECYSTECTOMY  05/04/2011   Procedure: LAPAROSCOPIC CHOLECYSTECTOMY;  Surgeon: Harl Bowie, MD;  Location: [facility];  Service: General;  Laterality: N/A;  . fiboroids  08/2016   surgery for fibroids  . INTRAOPERATIVE CHOLANGIOGRAM  05/04/2011   Procedure: INTRAOPERATIVE CHOLANGIOGRAM;  Surgeon: Harl Bowie, MD;  Location: [facility];  Service: General;  Laterality: N/A;  . IR ANGIOGRAM PELVIS SELECTIVE OR SUPRASELECTIVE  09/29/2016  . IR ANGIOGRAM PELVIS SELECTIVE OR SUPRASELECTIVE  09/29/2016  . IR ANGIOGRAM SELECTIVE EACH ADDITIONAL VESSEL  09/29/2016    . IR ANGIOGRAM SELECTIVE EACH ADDITIONAL VESSEL  09/29/2016  . IR EMBO TUMOR ORGAN ISCHEMIA INFARCT INC GUIDE ROADMAPPING  09/29/2016  . IR RADIOLOGIST EVAL & MGMT  08/11/2016  . IR RADIOLOGIST EVAL & MGMT  10/21/2016  . IR RADIOLOGIST EVAL & MGMT  05/04/2017  . IR US GUIDE VASC ACCESS LEFT  09/29/2016  . TUBAL LIGATION     with last c-section    OB History    Gravida  4   Para  2   Term  2   Preterm      AB  2   Living  2     SAB      TAB      Ectopic      Multiple      Live Births               Home Medications    Prior to Admission medications   Medication Sig Start Date End Date Taking? Authorizing Provider  amLODipine (NORVASC) 10 MG tablet Take 1 tablet (10 mg total) by mouth daily. 12/26/18   Charlott Rakes, MD  carvedilol (COREG) 25 MG tablet TAKE 1 TABLET BY MOUTH 2 TIMES DAILY WITH A MEAL. 12/26/18   Charlott Rakes, MD  lisinopril (ZESTRIL) 40 MG tablet Take 1 tablet (40 mg total) by mouth daily. 12/26/18   Charlott Rakes, MD  magnesium citrate SOLN Take 148 mLs (0.5 Bottles total) by mouth once for 1 dose. 06/12/19  06/12/19  Tasia Catchings, Timmi Devora V, PA-C  mineral oil enema Place 133 mLs (1 enema total) rectally once for 1 dose. 06/12/19 06/12/19  Ok Edwards, PA-C  Multiple Vitamin (MULTIVITAMIN WITH MINERALS) TABS tablet Take 1 tablet by mouth daily.    [provider]  omeprazole (PRILOSEC) 20 MG capsule Take 1 capsule (20 mg total) by mouth daily. 04/21/18   Davonna Belling, MD  cloNIDine (CATAPRES) 0.1 MG tablet Take 1 tablet (0.1 mg total) by mouth at bedtime. For insomnia and hot flashes Patient not taking: Reported on 10/07/2017 02/11/17 06/12/19  Charlott Rakes, MD    Family History Family History  Problem Relation Age of Onset  . Hypertension Mother   . Diabetes Father   . Hypertension Father     Social History Social History   Tobacco Use  . Smoking status: Never Smoker  . Smokeless tobacco: Never Used  Substance Use Topics  . Alcohol use:  No  . Drug use: No     Allergies   Amoxicillin   Review of Systems Review of Systems  Reason unable to perform ROS: See HPI as above.     Physical Exam Triage Vital Signs ED Triage Vitals [06/12/19 1248]  Enc Vitals Group     BP 127/83     Pulse Rate 83     Resp 18     Temp 98.8 F (37.1 C)     Temp Source Oral     SpO2 98 %     Weight      Height      Head Circumference      Peak Flow      Pain Score 8     Pain Loc      Pain Edu?      Excl. in [place]?    No data found.  Updated Vital Signs BP 127/83 (BP Location: Left Arm)   Pulse 83   Temp 98.8 F (37.1 C) (Oral)   Resp 18   LMP 05/14/2019   SpO2 98%   Physical Exam Constitutional:      General: She is not in acute distress.    Appearance: She is well-developed. She is not ill-appearing, toxic-appearing or diaphoretic.  HENT:     Head: Normocephalic and atraumatic.  Eyes:     Conjunctiva/sclera: Conjunctivae normal.     Pupils: Pupils are equal, round, and reactive to light.  Cardiovascular:     Rate and Rhythm: Normal rate and regular rhythm.  Pulmonary:     Effort: Pulmonary effort is normal. No respiratory distress.     Comments: LCTAB Abdominal:     General: Bowel sounds are normal.     Palpations: Abdomen is soft.     Tenderness: There is no abdominal tenderness. There is no guarding or rebound.  Musculoskeletal:     Cervical back: Normal range of motion and neck supple.  Skin:    General: Skin is warm and dry.  Neurological:     Mental Status: She is alert and oriented to person, place, and time.  Psychiatric:        Behavior: Behavior normal.        Judgment: Judgment normal.      UC Treatments / Results  Labs (all labs ordered are listed, but only abnormal results are displayed) Labs Reviewed - No data to display  EKG   Radiology No results found.  Procedures Procedures (including critical care time)  Medications Ordered in UC Medications - No data to display  Initial  Impression / Assessment and Plan / UC Course  I have reviewed the triage vital signs and the nursing notes.  Pertinent labs & imaging results that were available during my care of the patient were reviewed by me and considered in my medical decision making (see chart for details).    ? Hard stool to the rectum causing constipation. Will start with mineral oil enema for symptoms. If still not improving, 0.5 bottle of mag citrate. Return precautions given. Patient expresses understanding and agrees to plan.  Final Clinical Impressions(s) / UC Diagnoses   Final diagnoses:  Constipation, unspecified constipation type    ED Prescriptions    Medication Sig Dispense Auth. Provider   mineral oil enema Place 133 mLs (1 enema total) rectally once for 1 dose. 133 mL Hebert Dooling V, PA-C   magnesium citrate SOLN Take 148 mLs (0.5 Bottles total) by mouth once for 1 dose. 195 mL Ok Edwards, PA-C     PDMP not reviewed this encounter.   Ok Edwards, PA-C 06/12/19 1720

## 2019-06-12 NOTE — ED Triage Notes (Signed)
Pt states in the process of a gastric sleeve and is on protein shakes. Pt c/o constipation for over a week. States last Monday started miralax, colace and suppositories, last bm was last Tuesday. Pt c/o pressure to rectum and hard to sit.

## 2019-08-07 ENCOUNTER — Other Ambulatory Visit: Payer: Self-pay | Admitting: Family Medicine

## 2019-08-07 DIAGNOSIS — I10 Essential (primary) hypertension: Secondary | ICD-10-CM

## 2019-08-25 MED FILL — OMEPRAZOLE 20 MG CAP: 20 | 90 days supply | Qty: 90 | Fill #0

## 2020-06-06 ENCOUNTER — Ambulatory Visit: Payer: BLUE CROSS/BLUE SHIELD | Admitting: Physician Assistant

## 2020-06-06 ENCOUNTER — Other Ambulatory Visit: Payer: Self-pay

## 2020-06-06 VITALS — BP 155/106 | HR 78 | Temp 98.2°F | Resp 18 | Ht 67.0 in | Wt 268.0 lb

## 2020-06-06 DIAGNOSIS — Z13220 Encounter for screening for lipoid disorders: Secondary | ICD-10-CM

## 2020-06-06 DIAGNOSIS — I10 Essential (primary) hypertension: Secondary | ICD-10-CM | POA: Diagnosis not present

## 2020-06-06 DIAGNOSIS — F419 Anxiety disorder, unspecified: Secondary | ICD-10-CM

## 2020-06-06 DIAGNOSIS — G4700 Insomnia, unspecified: Secondary | ICD-10-CM | POA: Diagnosis not present

## 2020-06-06 DIAGNOSIS — F4321 Adjustment disorder with depressed mood: Secondary | ICD-10-CM

## 2020-06-06 DIAGNOSIS — Z114 Encounter for screening for human immunodeficiency virus [HIV]: Secondary | ICD-10-CM

## 2020-06-06 MED ORDER — HYDROXYZINE HCL 10 MG PO TABS
10.0000 mg | ORAL_TABLET | Freq: Three times a day (TID) | ORAL | 0 refills | Status: DC | PRN
  Filled 2020-06-06: qty 60, 20d supply, fill #0

## 2020-06-06 MED ORDER — LISINOPRIL 20 MG PO TABS
20.0000 mg | ORAL_TABLET | Freq: Every day | ORAL | 1 refills | Status: DC
  Filled 2020-06-06: qty 30, 30d supply, fill #0
  Filled 2020-07-05: qty 30, 30d supply, fill #1

## 2020-06-06 NOTE — Progress Notes (Signed)
New Patient Office Visit  Subjective:  Patient ID: Mary Mathis, female    DOB: [birth year]  Age: 45 y.o. MRN: [id number]  CC:  Chief Complaint  Patient presents with  . Hypertension    HPI Mary Mathis presents with several complaints.  Reports that she has been experiencing overwhelming grief, states that her mother passed away recently and her father passed away last week.  Reports that she had difficulty swallowing water yesterday, is unsure if this was some form of anxiety attack.  Reports that she is having difficulty sleeping, states that this has been present prior to this episode of grief.  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  Endorses good sleep hygiene.  Reports that she has previously been diagnosed with hypertension, states that she wants to become more compliant to her medications, states that her blood pressure was elevated yesterday when she checked it at work, states that today she did take a leftover lisinopril that she had which was 40 mg.  Reports that she had a gastric sleeve completed approximately 1 year ago.  Reports that she has lost 70 pounds in that timeframe.  Reports that she continues to follow-up with that provider.  Reports a significant history of bacterial overgrowth in her abdomen, states that she takes a daily medication that used to offer relief, but states that it is no longer offering relief.   Past Medical History:  Diagnosis Date  . Anxiety   . Cholecystitis   . Fibroids   . Hypertension     Past Surgical History:  Procedure Laterality Date  . CESAREAN SECTION     x2  . CHOLECYSTECTOMY  05/04/2011   Procedure: LAPAROSCOPIC CHOLECYSTECTOMY;  Surgeon: Harl Bowie, MD;  Location: [facility];  Service: General;  Laterality: N/A;  . fiboroids  08/2016   surgery for fibroids  . INTRAOPERATIVE CHOLANGIOGRAM  05/04/2011   Procedure: INTRAOPERATIVE CHOLANGIOGRAM;  Surgeon: Harl Bowie, MD;   Location: [facility];  Service: General;  Laterality: N/A;  . IR ANGIOGRAM PELVIS SELECTIVE OR SUPRASELECTIVE  09/29/2016  . IR ANGIOGRAM PELVIS SELECTIVE OR SUPRASELECTIVE  09/29/2016  . IR ANGIOGRAM SELECTIVE EACH ADDITIONAL VESSEL  09/29/2016  . IR ANGIOGRAM SELECTIVE EACH ADDITIONAL VESSEL  09/29/2016  . IR EMBO TUMOR ORGAN ISCHEMIA INFARCT INC GUIDE ROADMAPPING  09/29/2016  . IR RADIOLOGIST EVAL & MGMT  08/11/2016  . IR RADIOLOGIST EVAL & MGMT  10/21/2016  . IR RADIOLOGIST EVAL & MGMT  05/04/2017  . IR US GUIDE VASC ACCESS LEFT  09/29/2016  . TUBAL LIGATION     with last c-section    Family History  Problem Relation Age of Onset  . Hypertension Mother   . Diabetes Father   . Hypertension Father     Social History   Socioeconomic History  . Marital status: Single    Spouse name: Not on file  . Number of children: Not on file  . Years of education: Not on file  . Highest education level: Not on file  Occupational History  . Not on file  Tobacco Use  . Smoking status: Never Smoker  . Smokeless tobacco: Never Used  Vaping Use  . Vaping Use: Never used  Substance and Sexual Activity  . Alcohol use: No  . Drug use: No  . Sexual activity: Never    Birth control/protection: None, Surgical  Other Topics Concern  . Not on file  Social History Narrative  .  Not on file   Social Determinants of Health   Financial Resource Strain: Not on file  Food Insecurity: Not on file  Transportation Needs: Not on file  Physical Activity: Not on file  Stress: Not on file  Social Connections: Not on file  Intimate Partner Violence: Not on file    ROS Review of Systems  Constitutional: Negative.   HENT: Negative.   Eyes: Negative.   Respiratory: Negative for shortness of breath.   Cardiovascular: Negative for chest pain.  Gastrointestinal: Negative.   Endocrine: Negative.   Genitourinary: Negative.   Musculoskeletal: Negative.   Skin: Negative.   Allergic/Immunologic: Negative.    Neurological: Negative for dizziness and weakness.  Hematological: Negative.   Psychiatric/Behavioral: Positive for dysphoric mood and sleep disturbance. Negative for self-injury and suicidal ideas. The patient is nervous/anxious.     Objective:   Today's Vitals: BP (!) 155/106 (BP Location: Left Arm, Patient Position: Sitting, Cuff Size: Large)   Pulse 78   Temp 98.2 F (36.8 C) (Oral)   Resp 18   Ht 5\' 7"  (1.702 m)   Wt 268 lb (121.6 kg)   SpO2 98%   BMI 41.97 kg/m   Physical Exam Vitals and nursing note reviewed.  Constitutional:      Appearance: Normal appearance.  HENT:     Head: Normocephalic and atraumatic.     Right Ear: External ear normal.     Left Ear: External ear normal.     Nose: Nose normal.     Mouth/Throat:     Mouth: Mucous membranes are moist.     Pharynx: Oropharynx is clear.  Eyes:     Extraocular Movements: Extraocular movements intact.     Conjunctiva/sclera: Conjunctivae normal.     Pupils: Pupils are equal, round, and reactive to light.  Cardiovascular:     Rate and Rhythm: Normal rate and regular rhythm.     Pulses: Normal pulses.     Heart sounds: Normal heart sounds.  Pulmonary:     Effort: Pulmonary effort is normal.     Breath sounds: Normal breath sounds.  Musculoskeletal:        General: Normal range of motion.     Cervical back: Normal range of motion and neck supple.  Skin:    General: Skin is warm and dry.  Neurological:     General: No focal deficit present.     Mental Status: She is oriented to person, place, and time.  Psychiatric:        Mood and Affect: Mood normal.        Behavior: Behavior normal.        Thought Content: Thought content normal.        Judgment: Judgment normal.     Assessment & Plan:   Problem List Items Addressed This Visit      Cardiovascular and Mediastinum   Essential hypertension - Primary   Relevant Medications   lisinopril (ZESTRIL) 20 MG tablet   Other Relevant Orders   CBC with  Differential/Platelet   Comp. Metabolic Panel (12)   TSH     Other   Anxiety   Relevant Medications   hydrOXYzine (ATARAX/VISTARIL) 10 MG tablet   Other Relevant Orders   Vitamin D, 25-hydroxy    Other Visit Diagnoses    Grief       Insomnia, unspecified type       Relevant Medications   hydrOXYzine (ATARAX/VISTARIL) 10 MG tablet   Screening, lipid  Relevant Orders   Lipid panel   Screening for HIV without presence of risk factors       Relevant Orders   HIV antibody (with reflex)      Outpatient Encounter Medications as of 06/06/2020  Medication Sig  . hydrOXYzine (ATARAX/VISTARIL) 10 MG tablet Take 1 tablet (10 mg total) by mouth 3 (three) times daily as needed.  . Multiple Vitamin (MULTIVITAMIN WITH MINERALS) TABS tablet Take 1 tablet by mouth daily.  Marland Kitchen omeprazole (PRILOSEC) 20 MG capsule Take 1 capsule (20 mg total) by mouth daily.  Marland Kitchen amLODipine (NORVASC) 10 MG tablet Take 1 tablet (10 mg total) by mouth daily. Must have office visit for refills (Patient not taking: Reported on 06/06/2020)  . carvedilol (COREG) 25 MG tablet TAKE 1 TABLET BY MOUTH 2 TIMES DAILY WITH A MEAL. (Patient not taking: Reported on 06/06/2020)  . lisinopril (ZESTRIL) 20 MG tablet Take 1 tablet (20 mg total) by mouth daily.  . [DISCONTINUED] cloNIDine (CATAPRES) 0.1 MG tablet Take 1 tablet (0.1 mg total) by mouth at bedtime. For insomnia and hot flashes (Patient not taking: Reported on 10/07/2017)  . [DISCONTINUED] lisinopril (ZESTRIL) 40 MG tablet Take 1 tablet (40 mg total) by mouth daily. (Patient not taking: Reported on 06/06/2020)   Facility-Administered Encounter Medications as of 06/06/2020  Medication  . gadopentetate dimeglumine (MAGNEVIST) injection 20 mL  1. Essential hypertension Patient was previously on 3 different blood pressure medications, however she has lost 70 pounds.  Her blood pressure today is elevated however encouraged patient to try lisinopril 20 mg instead of 40 mg.  Encourage  patient to check blood pressure at home on a daily basis, keep a written log and have available for all office visits.  Fasting labs completed today, patient to return to mobile unit 3 weeks for blood pressure follow-up. - lisinopril (ZESTRIL) 20 MG tablet; Take 1 tablet (20 mg total) by mouth daily.  Dispense: 30 tablet; Refill: 1 - CBC with Differential/Platelet - Comp. Metabolic Panel (12) - TSH  2. Anxiety Trial hydroxyzine 10 mg every 8 hours as needed - hydrOXYzine (ATARAX/VISTARIL) 10 MG tablet; Take 1 tablet (10 mg total) by mouth 3 (three) times daily as needed.  Dispense: 60 tablet; Refill: 0 - Vitamin D, 25-hydroxy  3. Grief Patient education given on strategies to help manage loss, patient states good family support  4. Insomnia, unspecified type Trial hydroxyzine 20 to 40 mg as needed - hydrOXYzine (ATARAX/VISTARIL) 10 MG tablet; Take 1 tablet (10 mg total) by mouth 3 (three) times daily as needed.  Dispense: 60 tablet; Refill: 0  5. Screening, lipid  - Lipid panel  6. Screening for HIV without presence of risk factors  - HIV antibody (with reflex)   I have reviewed the patient's medical history (PMH, PSH, Social History, Family History, Medications, and allergies) , and have been updated if relevant. I spent 30 minutes reviewing chart and  face to face time with patient.     Follow-up: Return in about 3 weeks (around 06/27/2020).   Loraine Grip Mayers, PA-C

## 2020-06-06 NOTE — Progress Notes (Signed)
Patient has taken medication today. Patient has had breakfast. Patient denies pain at this time. Patient recently lost her mom and father and is going through grief.

## 2020-06-06 NOTE — Patient Instructions (Signed)
Please start taking lisinopril 20 mg a day instead of 40 mg a day.  Please check your blood pressure at home daily, keep a written log and have available for all office visits.  I encourage you to try the hydroxyzine 10 mg up to 3 times a day or every 8 hours to help with elevated stress and anxiety.  You can take a higher dose of 20 to 40 mg at bedtime to help you with insomnia.  We will call you with your lab results.  I encourage you to return to the mobile unit in 3 weeks for follow-up.  Kennieth Rad, PA-C Physician Assistant [place] [email or web address]    Managing Loss, Adult People experience loss in many different ways throughout their lives. Events such as moving, changing jobs, and losing friends can create a sense of loss. The loss may be as serious as a major health change, divorce, death of a pet, or death of a loved one. All of these types of loss are likely to create a physical and emotional reaction known as grief. Grief is the result of a major change or an absence of something or someone that you count on. Grief is a normal reaction to loss. A variety of factors can affect your grieving experience, including:  The nature of your loss.  Your relationship to what or whom you lost.  Your understanding of grief and how to manage it.  Your support system. How to manage lifestyle changes Keep to your normal routine as much as possible.  If you have trouble focusing or doing normal activities, it is acceptable to take some time away from your normal routine.  Spend time with friends and loved ones.  Eat a healthy diet, get plenty of sleep, and rest when you feel tired.   How to recognize changes  The way that you deal with your grief will affect your ability to function as you normally do. When grieving, you may experience these changes:  Numbness, shock, sadness, anxiety, anger, denial, and guilt.  Thoughts  about death.  Unexpected crying.  A physical sensation of emptiness in your stomach.  Problems sleeping and eating.  Tiredness (fatigue).  Loss of interest in normal activities.  Dreaming about or imagining seeing the person who died.  A need to remember what or whom you lost.  Difficulty thinking about anything other than your loss for a period of time.  Relief. If you have been expecting the loss for a while, you may feel a sense of relief when it happens. Follow these instructions at home: Activity Express your feelings in healthy ways, such as:  Talking with others about your loss. It may be helpful to find others who have had a similar loss, such as a support group.  Writing down your feelings in a journal.  Doing physical activities to release stress and emotional energy.  Doing creative activities like painting, sculpting, or playing or listening to music.  Practicing resilience. This is the ability to recover and adjust after facing challenges. Reading some resources that encourage resilience may help you to learn ways to practice those behaviors.   General instructions  Be patient with yourself and others. Allow the grieving process to happen, and remember that grieving takes time. ? It is likely that you may never feel completely done with some grief. You may find a way to move on while still cherishing memories and feelings about your loss. ? Accepting your loss is a  process. It can take months or longer to adjust.  Keep all follow-up visits as told by your health care provider. This is important. Where to find support To get support for managing loss:  Ask your health care provider for help and recommendations, such as grief counseling or therapy.  Think about joining a support group for people who are managing a loss. Where to find more information You can find more information about managing loss from:  American Society of Clinical Oncology:  www.cancer.net  American Psychological Association: TVStereos.ch Contact a health care provider if:  Your grief is extreme and keeps getting worse.  You have ongoing grief that does not improve.  Your body shows symptoms of grief, such as illness.  You feel depressed, anxious, or lonely. Get help right away if:  You have thoughts about hurting yourself or others. If you ever feel like you may hurt yourself or others, or have thoughts about taking your own life, get help right away. You can go to your nearest emergency department or call:  Your local emergency services (911 in the [place]  A suicide crisis helpline, such as the [facility] at 854-771-7391. This is open 24 hours a day. Summary  Grief is the result of a major change or an absence of someone or something that you count on. Grief is a normal reaction to loss.  The depth of grief and the period of recovery depend on the type of loss and your ability to adjust to the change and process your feelings.  Processing grief requires patience and a willingness to accept your feelings and talk about your loss with people who are supportive.  It is important to find resources that work for you and to realize that people experience grief differently. There is not one grieving process that works for everyone in the same way.  Be aware that when grief becomes extreme, it can lead to more severe issues like isolation, depression, anxiety, or suicidal thoughts. Talk with your health care provider if you have any of these issues. This information is not intended to replace advice given to you by your health care provider. Make sure you discuss any questions you have with your health care provider. Document Revised: 08/10/2019 Document Reviewed: 08/10/2019 Elsevier Patient Education  [place].

## 2020-06-07 ENCOUNTER — Other Ambulatory Visit: Payer: Self-pay

## 2020-06-09 DIAGNOSIS — F4321 Adjustment disorder with depressed mood: Secondary | ICD-10-CM | POA: Insufficient documentation

## 2020-06-09 DIAGNOSIS — G4700 Insomnia, unspecified: Secondary | ICD-10-CM | POA: Insufficient documentation

## 2020-06-11 LAB — CBC WITH DIFFERENTIAL/PLATELET
Basophils Absolute: 0 10*3/uL (ref 0.0–0.2)
Basos: 1 %
EOS (ABSOLUTE): 0.2 10*3/uL (ref 0.0–0.4)
Eos: 3 %
Hematocrit: 43.6 % (ref 34.0–46.6)
Hemoglobin: 14.8 g/dL (ref 11.1–15.9)
Immature Grans (Abs): 0 10*3/uL (ref 0.0–0.1)
Immature Granulocytes: 0 %
Lymphocytes Absolute: 2.2 10*3/uL (ref 0.7–3.1)
Lymphs: 29 %
MCH: 31.2 pg (ref 26.6–33.0)
MCHC: 33.9 g/dL (ref 31.5–35.7)
MCV: 92 fL (ref 79–97)
Monocytes Absolute: 0.6 10*3/uL (ref 0.1–0.9)
Monocytes: 8 %
Neutrophils Absolute: 4.4 10*3/uL (ref 1.4–7.0)
Neutrophils: 59 %
Platelets: 262 10*3/uL (ref 150–450)
RBC: 4.75 x10E6/uL (ref 3.77–5.28)
RDW: 12.1 % (ref 11.7–15.4)
WBC: 7.5 10*3/uL (ref 3.4–10.8)

## 2020-06-11 LAB — COMP. METABOLIC PANEL (12)
AST: 15 IU/L (ref 0–40)
Albumin/Globulin Ratio: 1.4 (ref 1.2–2.2)
Albumin: 4.1 g/dL (ref 3.8–4.8)
Alkaline Phosphatase: 98 IU/L (ref 44–121)
BUN/Creatinine Ratio: 14 (ref 9–23)
BUN: 13 mg/dL (ref 6–24)
Bilirubin Total: <0.2 mg/dL (ref 0.0–1.2)
Calcium: 9.4 mg/dL (ref 8.7–10.2)
Chloride: 104 mmol/L (ref 96–106)
Creatinine, Ser: 0.96 mg/dL (ref 0.57–1.00)
Globulin, Total: 2.9 g/dL (ref 1.5–4.5)
Glucose: 95 mg/dL (ref 65–99)
Potassium: 3.9 mmol/L (ref 3.5–5.2)
Sodium: 140 mmol/L (ref 134–144)
Total Protein: 7 g/dL (ref 6.0–8.5)
eGFR: 75 mL/min/1.73

## 2020-06-11 LAB — LIPID PANEL
Chol/HDL Ratio: 2.7 ratio (ref 0.0–4.4)
Cholesterol, Total: 163 mg/dL (ref 100–199)
HDL: 61 mg/dL
LDL Chol Calc (NIH): 86 mg/dL (ref 0–99)
Triglycerides: 83 mg/dL (ref 0–149)
VLDL Cholesterol Cal: 16 mg/dL (ref 5–40)

## 2020-06-11 LAB — HIV ANTIBODY (ROUTINE TESTING W REFLEX): HIV Screen 4th Generation wRfx: NONREACTIVE

## 2020-06-11 LAB — VITAMIN D 25 HYDROXY (VIT D DEFICIENCY, FRACTURES): Vit D, 25-Hydroxy: 54.9 ng/mL (ref 30.0–100.0)

## 2020-06-11 LAB — TSH: TSH: 1.79 u[IU]/mL (ref 0.450–4.500)

## 2020-06-18 ENCOUNTER — Telehealth: Payer: Self-pay | Admitting: *Deleted

## 2020-06-18 NOTE — Telephone Encounter (Signed)
Patient verified DOB Patient is aware of all results being normal and screenings being negative with no medication changes at this time.

## 2020-06-18 NOTE — Telephone Encounter (Signed)
-----   Message from Kennieth Rad, [place] sent at 06/12/2020 10:13 AM EDT ----- Please call patient and let her know that her kidney function, liver function and thyroid function are within normal limits.  She does not show signs of anemia.  Her screening for HIV was negative.  Her cholesterol is well controlled.  No medication changes needed at this time.  The 10-year ASCVD risk score Mikey Bussing DC Brooke Bonito., et al., 2013) is: 3.4%   Values used to calculate the score:     Age: 45 years     Sex: Female     Is Non-Hispanic African American: Yes     Diabetic: No     Tobacco smoker: No     Systolic Blood Pressure: 388 mmHg     Is BP treated: Yes     HDL Cholesterol: 61 mg/dL     Total Cholesterol: 163 mg/dL

## 2020-07-05 ENCOUNTER — Other Ambulatory Visit: Payer: Self-pay

## 2023-01-07 ENCOUNTER — Ambulatory Visit: Payer: BLUE CROSS/BLUE SHIELD | Attending: Physician Assistant | Admitting: Physician Assistant

## 2023-01-07 ENCOUNTER — Encounter: Payer: Self-pay | Admitting: Physician Assistant

## 2023-01-07 VITALS — BP 162/115 | HR 81 | Wt 314.2 lb

## 2023-01-07 DIAGNOSIS — I10 Essential (primary) hypertension: Secondary | ICD-10-CM

## 2023-01-07 DIAGNOSIS — I160 Hypertensive urgency: Secondary | ICD-10-CM

## 2023-01-07 DIAGNOSIS — Z131 Encounter for screening for diabetes mellitus: Secondary | ICD-10-CM

## 2023-01-07 DIAGNOSIS — Z91199 Patient's noncompliance with other medical treatment and regimen due to unspecified reason: Secondary | ICD-10-CM | POA: Diagnosis not present

## 2023-01-07 MED ORDER — AMLODIPINE BESYLATE 10 MG PO TABS: 10.0000 mg | ORAL_TABLET | Freq: Every day | ORAL | 1 refills | Status: DC

## 2023-01-07 MED ORDER — LISINOPRIL 20 MG PO TABS: 20.0000 mg | ORAL_TABLET | Freq: Every day | ORAL | 1 refills | Status: DC

## 2023-01-07 MED ORDER — CLONIDINE HCL 0.1 MG PO TABS
0.1000 mg | ORAL_TABLET | Freq: Once | ORAL | Status: AC
  Administered 2023-01-07: 0.1 mg via ORAL

## 2023-01-07 NOTE — Patient Instructions (Signed)
Take 1/2 tab daily of the amlodipine and lisinopril for the first week.  Make sure you are drinking about 80 ounces water daily.  After 1 week you can take a whole tablet of each daily.    Sit still and quiet for 5 mins with deep breathing and then check BP daily and record.  Bring these readings to your next visit

## 2023-01-07 NOTE — Progress Notes (Signed)
Patient ID: Mary Mathis, female   DOB: 07-11-1975, 47 y.o.   MRN: [id number]   [facility] [place], is a 47 y.o. female  MVH:[id number]  [id number]  DOB - [birth year]  Chief Complaint  Patient presents with   Medical Management of Chronic Issues       Subjective:   Mary Mathis is a 47 y.o. female here today for resuming/reestablishing care with our office.  She was seen at [facility] some in the interim.  She has not been taking BP meds consistently for at least the last several month.  She denies HA/Dizziness/CP.  She has had 2 deaths in the family this past year-her mom at 39 with heart issues and her uncle.  This led her not to take as good of care as herself.  H/o gastric sleeve in 2021.  Pap in July.    No problems updated.  ALLERGIES: Allergies  Allergen Reactions   Amoxicillin Swelling and Rash    Swelling of the ankles and hands  Has patient had a PCN reaction causing immediate rash, facial/tongue/throat swelling, SOB or lightheadedness with hypotension: Yes Has patient had a PCN reaction causing severe rash involving mucus membranes or skin necrosis: No Has patient had a PCN reaction that required hospitalization Yes Has patient had a PCN reaction occurring within the last 10 years: Yes If all of the above answers are "NO", then may proceed with Cephalosporin use.     PAST MEDICAL HISTORY: Past Medical History:  Diagnosis Date   Anxiety    Cholecystitis    Fibroids    Hypertension     MEDICATIONS AT HOME: Prior to Admission medications   Medication Sig Start Date End Date Taking? Authorizing Provider  amLODipine (NORVASC) 10 MG tablet Take 1 tablet (10 mg total) by mouth daily. 01/07/23   Anders Simmonds, PA-C  carvedilol (COREG) 25 MG tablet TAKE 1 TABLET BY MOUTH 2 TIMES DAILY WITH A MEAL. Patient not taking: Reported on 06/06/2020 12/26/18   Hoy Register, MD  hydrOXYzine (ATARAX/VISTARIL) 10 MG tablet Take 1 tablet (10 mg total) by mouth 3 (three)  times daily as needed. Patient not taking: Reported on 01/07/2023 06/06/20   Mayers, Cari S, PA-C  lisinopril (ZESTRIL) 20 MG tablet Take 1 tablet (20 mg total) by mouth daily. 01/07/23   Anders Simmonds, PA-C  Multiple Vitamin (MULTIVITAMIN WITH MINERALS) TABS tablet Take 1 tablet by mouth daily. Patient not taking: Reported on 01/07/2023    [provider]  omeprazole (PRILOSEC) 20 MG capsule Take 1 capsule (20 mg total) by mouth daily. Patient not taking: Reported on 01/07/2023 04/21/18   Benjiman Core, MD  cloNIDine (CATAPRES) 0.1 MG tablet Take 1 tablet (0.1 mg total) by mouth at bedtime. For insomnia and hot flashes Patient not taking: Reported on 10/07/2017 02/11/17 06/12/19  Hoy Register, MD    ROS: Neg HEENT Neg resp Neg cardiac Neg GI Neg GU Neg MS Neg psych Neg neuro  Objective:   Vitals:   01/07/23 1535 01/07/23 1552 01/07/23 1601  BP: (!) 170/123 (!) 160/110 (!) 162/115  Pulse: 81    SpO2: 95%    Weight: (!) 314 lb 3.2 oz (142.5 kg)     Exam General appearance : Awake, alert, not in any distress. Speech Clear. Not toxic looking HEENT: Atraumatic and Normocephalic Neck: Supple, no JVD. No cervical lymphadenopathy.  Chest: Good air entry bilaterally, CTAB.  No rales/rhonchi/wheezing CVS: S1 S2 regular, no murmurs.  Extremities: B/L Lower Ext shows no edema,  both legs are warm to touch Neurology: Awake alert, and oriented X 3, CN II-XII intact, Non focal Skin: No Rash  Data Review Lab Results  Component Value Date   HGBA1C 5.7 08/10/2017   HGBA1C 5.7 02/11/2017   HGBA1C 5.8 12/05/2015    Assessment & Plan   1. Hypertensive urgency P started coming down - cloNIDine (CATAPRES) tablet 0.1 mg - Comprehensive metabolic panel  2. Essential hypertension Uncontrolled-resume meds - Comprehensive metabolic panel - Hemoglobin A1c - amLODipine (NORVASC) 10 MG tablet; Take 1 tablet (10 mg total) by mouth daily.  Dispense: 90 tablet; Refill: 1 -  lisinopril (ZESTRIL) 20 MG tablet; Take 1 tablet (20 mg total) by mouth daily.  Dispense: 90 tablet; Refill: 1 Take 1/2 tab daily of the amlodipine and lisinopril for the first week.  Make sure you are drinking about 80 ounces water daily.  After 1 week you can take a whole tablet of each daily.   Sit still and quiet for 5 mins with deep breathing and then check BP daily and record.  Bring these readings to your next visit  3. Screening for diabetes mellitus - Hemoglobin A1c  4. Noncompliance Compliance imperative!!!    Return in about 6 weeks (around 02/18/2023) for [facility] for BP the 3 months with PCp ([place] in chart).  The patient was given clear instructions to go to ER or return to medical center if symptoms don't improve, worsen or new problems develop. The patient verbalized understanding. The patient was told to call to get lab results if they haven't heard anything in the next week.      Georgian Co, PA-C [facility] [place], [place] [phone]   01/07/2023, 4:12 PM

## 2023-01-08 ENCOUNTER — Telehealth: Payer: Self-pay

## 2023-01-08 LAB — COMPREHENSIVE METABOLIC PANEL
ALT: 15 [IU]/L (ref 0–32)
AST: 17 [IU]/L (ref 0–40)
Albumin: 4.1 g/dL (ref 3.9–4.9)
Alkaline Phosphatase: 97 [IU]/L (ref 44–121)
BUN/Creatinine Ratio: 12 (ref 9–23)
BUN: 11 mg/dL (ref 6–24)
Bilirubin Total: 0.2 mg/dL (ref 0.0–1.2)
CO2: 24 mmol/L (ref 20–29)
Calcium: 9.4 mg/dL (ref 8.7–10.2)
Chloride: 104 mmol/L (ref 96–106)
Creatinine, Ser: 0.93 mg/dL (ref 0.57–1.00)
Globulin, Total: 3 g/dL (ref 1.5–4.5)
Glucose: 89 mg/dL (ref 70–99)
Potassium: 4.4 mmol/L (ref 3.5–5.2)
Sodium: 141 mmol/L (ref 134–144)
Total Protein: 7.1 g/dL (ref 6.0–8.5)
eGFR: 77 mL/min/{1.73_m2} (ref >59)

## 2023-01-08 LAB — HEMOGLOBIN A1C
Est. average glucose Bld gHb Est-mCnc: 114 mg/dL
Hgb A1c MFr Bld: 5.6 % (ref 4.8–5.6)

## 2023-01-08 NOTE — Telephone Encounter (Signed)
-----   Message from Georgian Co sent at 01/08/2023  7:20 AM EST ----- Please call patient. Labs normal including kidney, liver and electrolytes. Thanks, Georgian Co, PA-C

## 2023-01-08 NOTE — Telephone Encounter (Signed)
Pt was called and vm was left, Information has been sent to nurse pool.

## 2023-01-08 NOTE — Telephone Encounter (Unsigned)
Copied from CRM [id number]. Topic: General - Other >> Jan 08, 2023  4:56 PM Macon Large wrote: Reason for CRM: Pt stated that she was returning call for lab results. Cb# [phone]

## 2023-01-11 NOTE — Telephone Encounter (Signed)
Pt has been informed of lab results. 

## 2023-02-18 ENCOUNTER — Ambulatory Visit: Payer: BLUE CROSS/BLUE SHIELD | Attending: Family Medicine | Admitting: Pharmacist

## 2023-02-18 ENCOUNTER — Encounter: Payer: Self-pay | Admitting: Pharmacist

## 2023-02-18 ENCOUNTER — Other Ambulatory Visit: Payer: Self-pay

## 2023-02-18 VITALS — BP 144/92 | HR 87

## 2023-02-18 DIAGNOSIS — I10 Essential (primary) hypertension: Secondary | ICD-10-CM

## 2023-02-18 MED ORDER — VALSARTAN 160 MG PO TABS
160.0000 mg | ORAL_TABLET | Freq: Every day | ORAL | 3 refills | Status: DC
  Filled 2023-02-18: qty 90, 90d supply, fill #0

## 2023-02-18 MED ORDER — VALSARTAN 160 MG PO TABS: 160.0000 mg | ORAL_TABLET | Freq: Every day | ORAL | 1 refills | Status: DC

## 2023-02-18 NOTE — Progress Notes (Signed)
S:     No chief complaint on file.  47 y.o. female who presents for hypertension evaluation, education, and management.  PMH is significant for HTN, vertigo, obesity, anxiety.  Patient was referred and last seen by Georgian Co, on 01/07/2023. BP was 162/115 mmHg at that visit, however, pt admitted to not taking medication consistently.   Today, patient arrives in good spirits and presents without assistance. Denies dizziness, headache, blurred vision, swelling. Patient reports hypertension was diagnosed sometime around 2020. Of note, she is wanting to do a better job at controlling BP given her family history and is amenable to changing therapy today if needed.   Family/Social history:  Fhx: HTN, CHF, renal failure Tobacco: never smoker Alcohol: none   Medication adherence reported. Patient has taken BP medications today.   Current antihypertensives include: amlodipine 10 mg daily, lisinopril 10 mg daily  Antihypertensives tried in the past include: carvedilol  Reported home BP readings:  -None recently   Patient reported dietary habits:  -Sodium: compliant with restriction -Caffeine: none reported   Patient-reported exercise habits:  -Walks 10-15 minutes during work; uses stationary bike after work   O:  Vitals:   02/18/23 1539  BP: (!) 144/92  Pulse: 87    Last 3 Office BP readings: BP Readings from Last 3 Encounters:  02/18/23 (!) 144/92  01/07/23 (!) 162/115  06/06/20 (!) 155/106    BMET    Component Value Date/Time   NA 141 01/07/2023 1622   K 4.4 01/07/2023 1622   CL 104 01/07/2023 1622   CO2 24 01/07/2023 1622   GLUCOSE 89 01/07/2023 1622   GLUCOSE 108 (H) 04/21/2018 1009   BUN 11 01/07/2023 1622   CREATININE 0.93 01/07/2023 1622   CREATININE 0.91 04/10/2016 1110   CALCIUM 9.4 01/07/2023 1622   GFRNONAA >60 04/21/2018 1009   GFRAA >60 04/21/2018 1009    Renal function: CrCl cannot be calculated (Patient's most recent lab result is older than  the maximum 21 days allowed.).  Clinical ASCVD: No  The 10-year ASCVD risk score (Arnett DK, et al., 2019) is: 3%   Values used to calculate the score:     Age: 67 years     Sex: Female     Is Non-Hispanic African American: Yes     Diabetic: No     Tobacco smoker: No     Systolic Blood Pressure: 144 mmHg     Is BP treated: Yes     HDL Cholesterol: 61 mg/dL     Total Cholesterol: 163 mg/dL  Patient is participating in a Managed Medicaid Plan: No    A/P: Hypertension diagnosed currently above goal on current medications. BP goal < 130/80 mmHg. Medication adherence appears appropriate. Given her inherent risk of angioedema with ACEi and the need for improved BP control, we will change her lisinopril to valsartan.  -Discontinued lisinopril.  -Started valsartan 160 mg daily instead. -Continue amlodipine 10 mg daily.  -Patient educated on purpose, proper use, and potential adverse effects of valsartan.  -F/u labs ordered - none -Counseled on lifestyle modifications for blood pressure control including reduced dietary sodium, increased exercise, adequate sleep. -Encouraged patient to check BP at home and bring log of readings to next visit. Counseled on proper use of home BP cuff.   Results reviewed and written information provided.    Written patient instructions provided. Patient verbalized understanding of treatment plan.  Total time in face to face counseling 20 minutes.    Follow-up:  Pharmacist  in 1 month.  Butch Penny, PharmD, Patsy Baltimore, CPP Clinical Pharmacist [facility] [phone]

## 2023-04-12 ENCOUNTER — Ambulatory Visit: Payer: Self-pay | Admitting: Family Medicine

## 2023-05-12 ENCOUNTER — Ambulatory Visit: Payer: BLUE CROSS/BLUE SHIELD | Admitting: Family Medicine

## 2023-07-06 ENCOUNTER — Ambulatory Visit: Payer: Self-pay | Attending: Internal Medicine | Admitting: Internal Medicine

## 2023-07-06 ENCOUNTER — Other Ambulatory Visit: Payer: Self-pay | Admitting: Physician Assistant

## 2023-07-06 ENCOUNTER — Encounter: Payer: Self-pay | Admitting: Internal Medicine

## 2023-07-06 VITALS — BP 130/83 | HR 83 | Temp 98.2°F | Ht 67.0 in | Wt 310.0 lb

## 2023-07-06 DIAGNOSIS — I10 Essential (primary) hypertension: Secondary | ICD-10-CM

## 2023-07-06 MED ORDER — HYDROCHLOROTHIAZIDE 12.5 MG PO TABS: 12.5000 mg | ORAL_TABLET | Freq: Every day | ORAL | 0 refills | Status: DC

## 2023-07-06 NOTE — Progress Notes (Signed)
 Patient ID: Mary Mathis, female    DOB: [birth year]  MRN: [id number]  CC: Pre-op Exam (Pre-op exam/Dental clearance form/No to Tdap vax.)   Subjective: Mary Mathis is a 48 y.o. female who presents for pre-procedure eval. PCP is Dr. Adan Holms Her concerns today include:  history of hypertension, morbid obesity, prediabetes   Discussed the use of AI scribe software for clinical note transcription with the patient, who gave verbal consent to proceed.  History of Present Illness Mary Mathis is a 48 year old female with hypertension who presents for blood pressure evaluation prior to a dental procedure.  She went to [facility] today to their dental assistant program to have deep cleaning done. Her blood pressure was elevated at 163/105 mmHg. Pt was advised to see PCP first to get BP better.  She is on valsartan  160 mg daily and amlodipine  10 mg daily, taken at 7:30 AM today. She does not regularly monitor her blood pressure at home since December, relying on her ability to sense elevated levels by how she feels. Her diet includes limited salt and daily fresh fruits and vegetables. She experiences no chest pain, shortness of breath, or leg swelling. She has a home blood pressure device but has not been using it regularly.    Patient Active Problem List   Diagnosis Date Noted   Grief 06/09/2020   Insomnia 06/09/2020   Uterine fibroid 06/29/2016   Anxiety 03/10/2016   ASCUS of cervix with negative high risk HPV 02/11/2016   Dysmenorrhea 01/20/2016   Menorrhagia 01/10/2016   Benign paroxysmal positional vertigo 01/10/2016   Essential hypertension 07/06/2014   Obesity 07/06/2014   Pedal edema 07/06/2014     Current Outpatient Medications on File Prior to Visit  Medication Sig Dispense Refill   valsartan  (DIOVAN ) 160 MG tablet Take 1 tablet (160 mg total) by mouth daily. 90 tablet 1   amLODipine  (NORVASC ) 10 MG tablet TAKE 1 TABLET BY MOUTH EVERY DAY 90 tablet 1   carvedilol   (COREG ) 25 MG tablet TAKE 1 TABLET BY MOUTH 2 TIMES DAILY WITH A MEAL. (Patient not taking: Reported on 07/06/2023) 180 tablet 1   hydrOXYzine  (ATARAX /VISTARIL ) 10 MG tablet Take 1 tablet (10 mg total) by mouth 3 (three) times daily as needed. (Patient not taking: Reported on 07/06/2023) 60 tablet 0   Multiple Vitamin (MULTIVITAMIN WITH MINERALS) TABS tablet Take 1 tablet by mouth daily. (Patient not taking: Reported on 01/07/2023)     omeprazole  (PRILOSEC) 20 MG capsule Take 1 capsule (20 mg total) by mouth daily. (Patient not taking: Reported on 07/06/2023) 14 capsule 0   [DISCONTINUED] cloNIDine  (CATAPRES ) 0.1 MG tablet Take 1 tablet (0.1 mg total) by mouth at bedtime. For insomnia and hot flashes (Patient not taking: Reported on 10/07/2017) 30 tablet 1   Current Facility-Administered Medications on File Prior to Visit  Medication Dose Route Frequency Provider Last Rate Last Admin   gadopentetate dimeglumine  (MAGNEVIST ) injection 20 mL  20 mL Intravenous Once PRN Penumalli, Vikram R, MD        Allergies  Allergen Reactions   Amoxicillin Swelling and Rash    Swelling of the ankles and hands  Has patient had a PCN reaction causing immediate rash, facial/tongue/throat swelling, SOB or lightheadedness with hypotension: Yes Has patient had a PCN reaction causing severe rash involving mucus membranes or skin necrosis: No Has patient had a PCN reaction that required hospitalization Yes Has patient had a PCN reaction occurring within the last 10 years: Yes  If all of the above answers are "NO", then may proceed with Cephalosporin use.     Social History   Socioeconomic History   Marital status: Single    Spouse name: Not on file   Number of children: Not on file   Years of education: Not on file   Highest education level: Not on file  Occupational History   Not on file  Tobacco Use   Smoking status: Never   Smokeless tobacco: Never  Vaping Use   Vaping status: Never Used  Substance and Sexual  Activity   Alcohol use: No   Drug use: No   Sexual activity: Never    Birth control/protection: None, Surgical  Other Topics Concern   Not on file  Social History Narrative   Not on file   Social Drivers of Health   Financial Resource Strain: Not on file  Food Insecurity: Not on file  Transportation Needs: Not on file  Physical Activity: Not on file  Stress: Not on file  Social Connections: Not on file  Intimate Partner Violence: Not on file    Family History  Problem Relation Age of Onset   Hypertension Mother    Diabetes Father    Hypertension Father     Past Surgical History:  Procedure Laterality Date   CESAREAN SECTION     x2   CHOLECYSTECTOMY  05/04/2011   Procedure: LAPAROSCOPIC CHOLECYSTECTOMY;  Surgeon: Rogena Class, MD;  Location: [facility];  Service: General;  Laterality: N/A;   fiboroids  08/2016   surgery for fibroids   INTRAOPERATIVE CHOLANGIOGRAM  05/04/2011   Procedure: INTRAOPERATIVE CHOLANGIOGRAM;  Surgeon: Rogena Class, MD;  Location: [facility];  Service: General;  Laterality: N/A;   IR ANGIOGRAM PELVIS SELECTIVE OR SUPRASELECTIVE  09/29/2016   IR ANGIOGRAM PELVIS SELECTIVE OR SUPRASELECTIVE  09/29/2016   IR ANGIOGRAM SELECTIVE EACH ADDITIONAL VESSEL  09/29/2016   IR ANGIOGRAM SELECTIVE EACH ADDITIONAL VESSEL  09/29/2016   IR EMBO TUMOR ORGAN ISCHEMIA INFARCT INC GUIDE ROADMAPPING  09/29/2016   IR RADIOLOGIST EVAL & MGMT  08/11/2016   IR RADIOLOGIST EVAL & MGMT  10/21/2016   IR RADIOLOGIST EVAL & MGMT  05/04/2017   IR US  GUIDE VASC ACCESS LEFT  09/29/2016   TUBAL LIGATION     with last c-section    ROS: Review of Systems Negative except as stated above  PHYSICAL EXAM: BP 130/83 (BP Location: Left Arm, Patient Position: Sitting, Cuff Size: Large)   Pulse 83   Temp 98.2 F (36.8 C) (Oral)   Ht 5\' 7"  (1.702 m)   Wt (!) 310 lb (140.6 kg)   SpO2 100%   BMI 48.55 kg/m   Physical Exam Repeat BP 133/88 General appearance - alert, well appearing,  morbidly obese middle age AAF and in no distress Mental status - normal mood, behavior, speech, dress, motor activity, and thought processes Chest - clear to auscultation, no wheezes, rales or rhonchi, symmetric air entry Heart - normal rate, regular rhythm, normal S1, S2, no murmurs, rubs, clicks or gallops Extremities - peripheral pulses normal, no pedal edema, no clubbing or cyanosis      Latest Ref Rng & Units 01/07/2023    4:22 PM 06/06/2020    3:56 PM 04/21/2018   10:09 AM  CMP  Glucose 70 - 99 mg/dL 89  95  102   BUN 6 - 24 mg/dL 11  13  8    Creatinine 0.57 - 1.00 mg/dL 7.25  3.66  4.40  Sodium 134 - 144 mmol/L 141  140  136   Potassium 3.5 - 5.2 mmol/L 4.4  3.9  4.0   Chloride 96 - 106 mmol/L 104  104  105   CO2 20 - 29 mmol/L 24   26   Calcium 8.7 - 10.2 mg/dL 9.4  9.4  8.6   Total Protein 6.0 - 8.5 g/dL 7.1  7.0  7.5   Total Bilirubin 0.0 - 1.2 mg/dL 0.2  <7.2  0.4   Alkaline Phos 44 - 121 IU/L 97  98  81   AST 0 - 40 IU/L 17  15  20    ALT 0 - 32 IU/L 15   18    Lipid Panel     Component Value Date/Time   CHOL 163 06/06/2020 1556   TRIG 83 06/06/2020 1556   HDL 61 06/06/2020 1556   CHOLHDL 2.7 06/06/2020 1556   LDLCALC 86 06/06/2020 1556    CBC    Component Value Date/Time   WBC 7.5 06/06/2020 1556   WBC 8.2 04/21/2018 1009   RBC 4.75 06/06/2020 1556   RBC 4.22 04/21/2018 1009   HGB 14.8 06/06/2020 1556   HCT 43.6 06/06/2020 1556   PLT 262 06/06/2020 1556   MCV 92 06/06/2020 1556   MCH 31.2 06/06/2020 1556   MCH 30.6 04/21/2018 1009   MCHC 33.9 06/06/2020 1556   MCHC 32.2 04/21/2018 1009   RDW 12.1 06/06/2020 1556   LYMPHSABS 2.2 06/06/2020 1556   MONOABS 0.9 12/24/2016 0040   EOSABS 0.2 06/06/2020 1556   BASOSABS 0.0 06/06/2020 1556    ASSESSMENT AND PLAN:  Assessment and Plan Assessment & Plan Hypertension Hypertension with recent elevated readings despite valsartan  and amlodipine . Blood pressure goal is 130/80 or lower.  - Add  hydrochlorothiazide  12.5 mg once daily. - Instruct to check blood pressure at home three times a week and record readings. - Schedule follow-up in one week to recheck blood pressure and review home readings.   Patient was given the opportunity to ask questions.  Patient verbalized understanding of the plan and was able to repeat key elements of the plan.   This documentation was completed using Paediatric nurse.  Any transcriptional errors are unintentional.  No orders of the defined types were placed in this encounter.    Requested Prescriptions   Signed Prescriptions Disp Refills   hydrochlorothiazide  (HYDRODIURIL ) 12.5 MG tablet 30 tablet 0    Sig: Take 1 tablet (12.5 mg total) by mouth daily.    Return in about 1 week (around 07/13/2023) for with me Dr. Lincoln Renshaw.  Concetta Dee, MD, FACP

## 2023-07-06 NOTE — Patient Instructions (Signed)
 VISIT SUMMARY:  Today, you came in for a blood pressure evaluation before your dental procedure. Your blood pressure was elevated at 163/105 mmHg, which prevented the dental procedure from taking place. You are currently taking valsartan  and amlodipine  for your hypertension, but you have not been regularly monitoring your blood pressure at home.  YOUR PLAN:  -HYPERTENSION: Hypertension means high blood pressure. Your blood pressure was high today despite your current medications. To help control your blood pressure, we are adding hydrochlorothiazide  12.5 mg once daily to your treatment plan. Please start checking your blood pressure at home three times a week and record the readings. We will follow up in one week to recheck your blood pressure and review your home readings.  INSTRUCTIONS:  Please start taking hydrochlorothiazide  12.5 mg once daily. Check your blood pressure at home three times a week and record the readings. Schedule a follow-up appointment in one week to recheck your blood pressure and review your home readings.

## 2023-07-15 ENCOUNTER — Ambulatory Visit: Payer: Self-pay | Admitting: Internal Medicine

## 2023-07-15 VITALS — BP 103/69 | HR 89 | Temp 98.1°F | Ht 67.0 in | Wt 300.0 lb

## 2023-07-19 ENCOUNTER — Other Ambulatory Visit: Payer: Self-pay | Admitting: Pharmacist

## 2023-07-19 ENCOUNTER — Telehealth: Payer: Self-pay

## 2023-07-19 DIAGNOSIS — G4700 Insomnia, unspecified: Secondary | ICD-10-CM

## 2023-07-19 DIAGNOSIS — F419 Anxiety disorder, unspecified: Secondary | ICD-10-CM

## 2023-07-19 DIAGNOSIS — I10 Essential (primary) hypertension: Secondary | ICD-10-CM

## 2023-07-19 MED ORDER — AMLODIPINE BESYLATE 10 MG PO TABS: 10.0000 mg | ORAL_TABLET | Freq: Every day | ORAL | 1 refills | Status: DC

## 2023-07-19 NOTE — Telephone Encounter (Signed)
 Copied from CRM [id number]. Topic: Clinical - Prescription Issue >> Jul 19, 2023 10:05 AM Baldemar Lev wrote:  Reason for CRM: Pt called to report that her amlodipine  is too expensive at [facility] and that she needs it sent to [facility] on [address] instead.

## 2023-07-19 NOTE — Telephone Encounter (Signed)
 Rxn sent

## 2023-07-20 ENCOUNTER — Telehealth: Payer: Self-pay

## 2023-07-20 NOTE — Telephone Encounter (Addendum)
 Called & spoke to the patient. Verified name & DOB. Patient appointment scheduled for July 9,2025 at 3:30 pm, patient acknowledged and had no further questions.  Copied from CRM [id number]. Topic: Appointments - Appointment Info/Confirmation >> Jul 15, 2023  8:33 AM Lotus Round B wrote: Patient/patient representative is calling for information regarding an appointment. >> Jul 19, 2023 10:10 AM Bearl Botts E wrote: Pt needs to be worked in for a BP check and also paperwork for a dental procedure.

## 2023-07-21 NOTE — Progress Notes (Signed)
 Left before being seen. She has been rescheduled with her PCP for July.

## 2023-07-28 ENCOUNTER — Other Ambulatory Visit: Payer: Self-pay | Admitting: Internal Medicine

## 2023-07-28 DIAGNOSIS — I10 Essential (primary) hypertension: Secondary | ICD-10-CM

## 2023-08-09 NOTE — Telephone Encounter (Unsigned)
 Copied from CRM [id number]. Topic: Clinical - Medication Refill >> Aug 09, 2023  1:55 PM Oddis Bench wrote: Medication: hydrochlorothiazide  (HYDRODIURIL ) 12.5 MG tablet  Has the patient contacted their pharmacy? No Was suppose to have it sent to a new address  This is the patient's preferred pharmacy:  [id number] - [place] ([place]), [place] - [address] [place] ([place]) [place] [address] Phone: [phone] Fax: [phone]   Is this the correct pharmacy for this prescription? Yes If no, delete pharmacy and type the correct one.   Has the prescription been filled recently? Yes  Is the patient out of the medication? Yes  Has the patient been seen for an appointment in the last year OR does the patient have an upcoming appointment? Yes  Can we respond through MyChart? No  Agent: Please be advised that Rx refills may take up to 3 business days. We ask that you follow-up with your pharmacy.

## 2023-08-09 NOTE — Telephone Encounter (Signed)
 Call to [id number] - [place] ([place]), [place] - [address] [place] ([place]) [place] [address]  Spoke with Janiqua. [place] voiced she will [id number] Jonette Nestle, [place] - [address]  [address]  [place] [address]    To have transferred to [facility]

## 2023-08-25 ENCOUNTER — Other Ambulatory Visit: Payer: Self-pay | Admitting: Family Medicine

## 2023-08-25 DIAGNOSIS — I10 Essential (primary) hypertension: Secondary | ICD-10-CM

## 2023-09-08 ENCOUNTER — Encounter: Payer: Self-pay | Admitting: Family Medicine

## 2023-09-08 ENCOUNTER — Ambulatory Visit: Payer: Self-pay | Attending: Family Medicine | Admitting: Family Medicine

## 2023-09-08 VITALS — BP 119/83 | HR 86 | Wt 300.0 lb

## 2023-09-08 DIAGNOSIS — Z6841 Body Mass Index (BMI) 40.0 and over, adult: Secondary | ICD-10-CM

## 2023-09-08 DIAGNOSIS — Z9884 Bariatric surgery status: Secondary | ICD-10-CM

## 2023-09-08 DIAGNOSIS — K911 Postgastric surgery syndromes: Secondary | ICD-10-CM

## 2023-09-08 DIAGNOSIS — Z131 Encounter for screening for diabetes mellitus: Secondary | ICD-10-CM

## 2023-09-08 DIAGNOSIS — Z1159 Encounter for screening for other viral diseases: Secondary | ICD-10-CM

## 2023-09-08 DIAGNOSIS — Z1211 Encounter for screening for malignant neoplasm of colon: Secondary | ICD-10-CM

## 2023-09-08 DIAGNOSIS — I10 Essential (primary) hypertension: Secondary | ICD-10-CM

## 2023-09-08 MED ORDER — VALSARTAN 160 MG PO TABS: 160.0000 mg | ORAL_TABLET | Freq: Every day | ORAL | 1 refills | Status: AC

## 2023-09-08 MED ORDER — HYDROCHLOROTHIAZIDE 12.5 MG PO TABS: 12.5000 mg | ORAL_TABLET | Freq: Every day | ORAL | 1 refills | Status: AC

## 2023-09-08 MED ORDER — AMLODIPINE BESYLATE 10 MG PO TABS
10.0000 mg | ORAL_TABLET | Freq: Every day | ORAL | 1 refills | Status: AC
Start: 2023-09-08 — End: ?

## 2023-09-08 NOTE — Patient Instructions (Signed)
 VISIT SUMMARY:  Today, you came in for a follow-up on your blood pressure management. We discussed your current medications, recent blood pressure readings, and the impact of stress and grief on your health. We also reviewed your weight management post-gastric sleeve surgery and discussed general health maintenance, including routine screenings.  YOUR PLAN:  -HYPERTENSION: Hypertension, or high blood pressure, is when the force of your blood against your artery walls is too high. You should continue taking amlodipine , hydrochlorothiazide , and valsartan  as prescribed. We have discontinued carvedilol . We will also check your potassium and electrolytes to ensure they are at healthy levels.  -OBESITY STATUS POST GASTRIC SLEEVE SURGERY: Obesity is a condition where you have an excessive amount of body fat. After your gastric sleeve surgery, you have experienced weight fluctuations due to emotional eating. We encourage you to adopt healthy eating habits and weight management strategies to help stabilize your weight.  -GENERAL HEALTH MAINTENANCE: Routine health maintenance is important for overall well-being. You are due for routine screenings, including a cholesterol check and a colonoscopy. We will order fasting labs to check your kidney function, liver function, and electrolytes. Please schedule a day off for your cholesterol check and follow up on the referral for a colonoscopy screening.  INSTRUCTIONS:  Please follow up for lab results and any health concerns that arise. You have been provided with a six-month supply of your medications. If any issues come up before your next scheduled visit, make an appointment to see us .

## 2023-09-08 NOTE — Progress Notes (Signed)
 Subjective:  Patient ID: Mary Mathis, female    DOB: 1975/04/13  Age: 48 y.o. MRN: [id number]  CC: Hypertension     Discussed the use of AI scribe software for clinical note transcription with the patient, who gave verbal consent to proceed.  History of Present Illness Mary Mathis is a 48 year old female with hypertension, morbid obesity status post gastric sleeve in 2021 who presents for blood pressure management.  She experiences elevated blood pressure, which she associates with stress and grief from the loss of her parents and uncle and is surprised that her blood pressure today is controlled at 119/83. Her current antihypertensive medications include amlodipine  10 mg, hydrochlorothiazide  12.5 mg, and valsartan , all taken once daily.   She underwent gastric sleeve surgery in 2021 for weight loss. Initially, she lost weight but has experienced fluctuations due to emotional eating related to grief. She is working on improving her weight management.  Her family history includes health issues similar to her parents and uncle, and her brother has had multiple strokes, which adds to her stress. She works two jobs, making it challenging to schedule medical appointments. No pain or swelling is reported.    Past Medical History:  Diagnosis Date   Anxiety    Cholecystitis    Fibroids    Hypertension     Past Surgical History:  Procedure Laterality Date   CESAREAN SECTION     x2   CHOLECYSTECTOMY  05/04/2011   Procedure: LAPAROSCOPIC CHOLECYSTECTOMY;  Surgeon: Vicenta DELENA Poli, MD;  Location: [facility];  Service: General;  Laterality: N/A;   fiboroids  08/2016   surgery for fibroids   INTRAOPERATIVE CHOLANGIOGRAM  05/04/2011   Procedure: INTRAOPERATIVE CHOLANGIOGRAM;  Surgeon: Vicenta DELENA Poli, MD;  Location: [facility];  Service: General;  Laterality: N/A;   IR ANGIOGRAM PELVIS SELECTIVE OR SUPRASELECTIVE  09/29/2016   IR ANGIOGRAM PELVIS SELECTIVE OR SUPRASELECTIVE  09/29/2016    IR ANGIOGRAM SELECTIVE EACH ADDITIONAL VESSEL  09/29/2016   IR ANGIOGRAM SELECTIVE EACH ADDITIONAL VESSEL  09/29/2016   IR EMBO TUMOR ORGAN ISCHEMIA INFARCT INC GUIDE ROADMAPPING  09/29/2016   IR RADIOLOGIST EVAL & MGMT  08/11/2016   IR RADIOLOGIST EVAL & MGMT  10/21/2016   IR RADIOLOGIST EVAL & MGMT  05/04/2017   IR US  GUIDE VASC ACCESS LEFT  09/29/2016   TUBAL LIGATION     with last c-section    Family History  Problem Relation Age of Onset   Hypertension Mother    Diabetes Father    Hypertension Father     Social History   Socioeconomic History   Marital status: Single    Spouse name: Not on file   Number of children: Not on file   Years of education: Not on file   Highest education level: Not on file  Occupational History   Not on file  Tobacco Use   Smoking status: Never   Smokeless tobacco: Never  Vaping Use   Vaping status: Never Used  Substance and Sexual Activity   Alcohol use: No   Drug use: No   Sexual activity: Never    Birth control/protection: None, Surgical  Other Topics Concern   Not on file  Social History Narrative   Not on file   Social Drivers of Health   Financial Resource Strain: Patient Declined (09/08/2023)   Overall Financial Resource Strain (CARDIA)    Difficulty of Paying Living Expenses: Patient declined  Food Insecurity: Patient Declined (09/08/2023)   Hunger  Vital Sign    Worried About Programme researcher, broadcasting/film/video in the Last Year: Patient declined    Ran Out of Food in the Last Year: Patient declined  Transportation Needs: Patient Declined (09/08/2023)   PRAPARE - Administrator, Civil Service (Medical): Patient declined    Lack of Transportation (Non-Medical): Patient declined  Physical Activity: Patient Declined (09/08/2023)   Exercise Vital Sign    Days of Exercise per Week: Patient declined    Minutes of Exercise per Session: Patient declined  Stress: Patient Declined (09/08/2023)   [employer] of Occupational Health -  Occupational Stress Questionnaire    Feeling of Stress: Patient declined  Social Connections: Patient Declined (09/08/2023)   Social Connection and Isolation Panel    Frequency of Communication with Friends and Family: Patient declined    Frequency of Social Gatherings with Friends and Family: Patient declined    Attends Religious Services: Patient declined    Database administrator or Organizations: Patient declined    Attends Engineer, structural: Patient declined    Marital Status: Patient declined    Allergies  Allergen Reactions   Amoxicillin Swelling and Rash    Swelling of the ankles and hands  Has patient had a PCN reaction causing immediate rash, facial/tongue/throat swelling, SOB or lightheadedness with hypotension: Yes Has patient had a PCN reaction causing severe rash involving mucus membranes or skin necrosis: No Has patient had a PCN reaction that required hospitalization Yes Has patient had a PCN reaction occurring within the last 10 years: Yes If all of the above answers are NO, then may proceed with Cephalosporin use.     Outpatient Medications Prior to Visit  Medication Sig Dispense Refill   amLODipine  (NORVASC ) 10 MG tablet Take 1 tablet (10 mg total) by mouth daily. 90 tablet 1   carvedilol  (COREG ) 25 MG tablet TAKE 1 TABLET BY MOUTH 2 TIMES DAILY WITH A MEAL. 180 tablet 1   hydrochlorothiazide  (HYDRODIURIL ) 12.5 MG tablet TAKE 1 TABLET BY MOUTH EVERY DAY 90 tablet 0   hydrOXYzine  (ATARAX /VISTARIL ) 10 MG tablet Take 1 tablet (10 mg total) by mouth 3 (three) times daily as needed. 60 tablet 0   Multiple Vitamin (MULTIVITAMIN WITH MINERALS) TABS tablet Take 1 tablet by mouth daily. (Patient not taking: Reported on 01/07/2023)     omeprazole  (PRILOSEC) 20 MG capsule Take 1 capsule (20 mg total) by mouth daily. (Patient not taking: Reported on 01/07/2023) 14 capsule 0   valsartan  (DIOVAN ) 160 MG tablet Take 1 tablet by mouth once daily 90 tablet 0    Facility-Administered Medications Prior to Visit  Medication Dose Route Frequency Provider Last Rate Last Admin   gadopentetate dimeglumine  (MAGNEVIST ) injection 20 mL  20 mL Intravenous Once PRN Penumalli, Vikram R, MD         ROS Review of Systems  Constitutional:  Negative for activity change and appetite change.  HENT:  Negative for sinus pressure and sore throat.   Respiratory:  Negative for chest tightness, shortness of breath and wheezing.   Cardiovascular:  Negative for chest pain and palpitations.  Gastrointestinal:  Negative for abdominal distention, abdominal pain and constipation.  Genitourinary: Negative.   Musculoskeletal: Negative.   Psychiatric/Behavioral:  Negative for behavioral problems and dysphoric mood.     Objective:  BP 119/83 (BP Location: Right Arm, Patient Position: Sitting, Cuff Size: Large)   Pulse 86   Wt 300 lb (136.1 kg)   LMP 09/05/2023   SpO2 96%  BMI 46.99 kg/m      09/08/2023    3:51 PM 07/15/2023   10:02 AM 07/06/2023    2:33 PM  BP/Weight  Systolic BP 119 103 130  Diastolic BP 83 69 83  Wt. (Lbs) 300 300 310  BMI 46.99 kg/m2 46.99 kg/m2 48.55 kg/m2      Physical Exam Constitutional:      Appearance: She is well-developed.  Cardiovascular:     Rate and Rhythm: Normal rate.     Heart sounds: Normal heart sounds. No murmur heard. Pulmonary:     Effort: Pulmonary effort is normal.     Breath sounds: Normal breath sounds. No wheezing or rales.  Chest:     Chest wall: No tenderness.  Abdominal:     General: Bowel sounds are normal. There is no distension.     Palpations: Abdomen is soft. There is no mass.     Tenderness: There is no abdominal tenderness.  Musculoskeletal:        General: Normal range of motion.     Right lower leg: No edema.     Left lower leg: No edema.  Neurological:     Mental Status: She is alert and oriented to person, place, and time.  Psychiatric:        Mood and Affect: Mood normal.         Latest Ref Rng & Units 01/07/2023    4:22 PM 06/06/2020    3:56 PM 04/21/2018   10:09 AM  CMP  Glucose 70 - 99 mg/dL 89  95  891   BUN 6 - 24 mg/dL 11  13  8    Creatinine 0.57 - 1.00 mg/dL 9.06  9.03  9.11   Sodium 134 - 144 mmol/L 141  140  136   Potassium 3.5 - 5.2 mmol/L 4.4  3.9  4.0   Chloride 96 - 106 mmol/L 104  104  105   CO2 20 - 29 mmol/L 24   26   Calcium 8.7 - 10.2 mg/dL 9.4  9.4  8.6   Total Protein 6.0 - 8.5 g/dL 7.1  7.0  7.5   Total Bilirubin 0.0 - 1.2 mg/dL 0.2  <9.7  0.4   Alkaline Phos 44 - 121 IU/L 97  98  81   AST 0 - 40 IU/L 17  15  20    ALT 0 - 32 IU/L 15   18     Lipid Panel     Component Value Date/Time   CHOL 163 06/06/2020 1556   TRIG 83 06/06/2020 1556   HDL 61 06/06/2020 1556   CHOLHDL 2.7 06/06/2020 1556   LDLCALC 86 06/06/2020 1556    CBC    Component Value Date/Time   WBC 7.5 06/06/2020 1556   WBC 8.2 04/21/2018 1009   RBC 4.75 06/06/2020 1556   RBC 4.22 04/21/2018 1009   HGB 14.8 06/06/2020 1556   HCT 43.6 06/06/2020 1556   PLT 262 06/06/2020 1556   MCV 92 06/06/2020 1556   MCH 31.2 06/06/2020 1556   MCH 30.6 04/21/2018 1009   MCHC 33.9 06/06/2020 1556   MCHC 32.2 04/21/2018 1009   RDW 12.1 06/06/2020 1556   LYMPHSABS 2.2 06/06/2020 1556   MONOABS 0.9 12/24/2016 0040   EOSABS 0.2 06/06/2020 1556   BASOSABS 0.0 06/06/2020 1556    Lab Results  Component Value Date   HGBA1C 5.6 01/07/2023       Assessment & Plan Hypertension Hypertension managed with amlodipine , hydrochlorothiazide , and  valsartan . Blood pressure improved to 119/83 mmHg. Stress and grief contribute to elevated levels. - Continue amlodipine , hydrochlorothiazide , and valsartan . - Discontinue carvedilol . - Check potassium and electrolytes.  Obesity status post gastric sleeve surgery Post-surgery weight fluctuation due to emotional eating related to grief. Working on [employer]. - Encourage healthy eating habits and weight management  strategies.  General Health Maintenance Due for routine screenings including cholesterol and colonoscopy. Last cholesterol check in 2022. Prefers colonoscopy for its ten-year validity. - Order labs for kidney function, liver function, and electrolytes. - Fasting labs for lipid panel when she is able to get some time off work - Refer for colonoscopy screening.  Follow-up Follow-up advised for lab results and any arising health concerns. - Provide six-month supply of medications. - Advise making an appointment if issues arise before next scheduled visit.    Meds ordered this encounter  Medications   amLODipine  (NORVASC ) 10 MG tablet    Sig: Take 1 tablet (10 mg total) by mouth daily.    Dispense:  90 tablet    Refill:  1   hydrochlorothiazide  (HYDRODIURIL ) 12.5 MG tablet    Sig: Take 1 tablet (12.5 mg total) by mouth daily.    Dispense:  90 tablet    Refill:  1   valsartan  (DIOVAN ) 160 MG tablet    Sig: Take 1 tablet (160 mg total) by mouth daily.    Dispense:  90 tablet    Refill:  1    Follow-up: No follow-ups on file.       Corrina Sabin, MD, FAAFP. [facility] [place], [place] [phone]   09/08/2023, 4:15 PM

## 2023-09-09 ENCOUNTER — Ambulatory Visit: Payer: Self-pay | Admitting: Family Medicine

## 2023-09-09 LAB — HEMOGLOBIN A1C
Est. average glucose Bld gHb Est-mCnc: 105 mg/dL
Hgb A1c MFr Bld: 5.3 % (ref 4.8–5.6)

## 2023-09-09 LAB — CMP14+EGFR
ALT: 15 IU/L (ref 0–32)
AST: 17 IU/L (ref 0–40)
Albumin: 4.4 g/dL (ref 3.9–4.9)
Alkaline Phosphatase: 97 IU/L (ref 44–121)
BUN/Creatinine Ratio: 11 (ref 9–23)
BUN: 11 mg/dL (ref 6–24)
Bilirubin Total: 0.2 mg/dL (ref 0.0–1.2)
CO2: 25 mmol/L (ref 20–29)
Calcium: 9.5 mg/dL (ref 8.7–10.2)
Chloride: 100 mmol/L (ref 96–106)
Creatinine, Ser: 1 mg/dL (ref 0.57–1.00)
Globulin, Total: 3.4 g/dL (ref 1.5–4.5)
Glucose: 86 mg/dL (ref 70–99)
Potassium: 3.8 mmol/L (ref 3.5–5.2)
Sodium: 140 mmol/L (ref 134–144)
Total Protein: 7.8 g/dL (ref 6.0–8.5)
eGFR: 70 mL/min/1.73 (ref >59)

## 2023-09-09 LAB — HCV AB W REFLEX TO QUANT PCR: HCV Ab: NONREACTIVE

## 2023-09-09 LAB — HCV INTERPRETATION

## 2023-11-17 ENCOUNTER — Other Ambulatory Visit: Payer: Self-pay | Admitting: Family Medicine

## 2023-11-17 DIAGNOSIS — I10 Essential (primary) hypertension: Secondary | ICD-10-CM

## 2023-11-17 NOTE — Telephone Encounter (Unsigned)
 Copied from CRM [id number]. Topic: Clinical - Medication Refill >> Nov 17, 2023  9:41 AM Precious C wrote: Medication: hydrochlorothiazide  (HYDRODIURIL ) 12.5 MG tablet  Has the patient contacted their pharmacy? Yes, advised to call provider (Agent: If no, request that the patient contact the pharmacy for the refill. If patient does not wish to contact the pharmacy document the reason why and proceed with request.) (Agent: If yes, when and what did the pharmacy advise?)  This is the patient's preferred pharmacy:  [facility] [address] ([address]), [place] - [address] [place] ([place]) [place] [address] Phone: [phone] Fax: [phone]   Is this the correct pharmacy for this prescription? Yes If no, delete pharmacy and type the correct one.   Has the prescription been filled recently? Yes  Is the patient out of the medication?  No  Has the patient been seen for an appointment in the last year OR does the patient have an upcoming appointment? Yes  Can we respond through MyChart? Yes  Agent: Please be advised that Rx refills may take up to 3 business days. We ask that you follow-up with your pharmacy.

## 2023-11-18 NOTE — Telephone Encounter (Signed)
 Too soon for refill, L[facility] 09/08/23  Requested Prescriptions  Pending Prescriptions Disp Refills   hydrochlorothiazide  (HYDRODIURIL ) 12.5 MG tablet 90 tablet 1    Sig: Take 1 tablet (12.5 mg total) by mouth daily.     Cardiovascular: Diuretics - Thiazide Passed - 11/18/2023  1:39 PM      Passed - Cr in normal range and within 180 days    Creat  Date Value Ref Range Status  04/10/2016 0.91 0.50 - 1.10 mg/dL Final   Creatinine, Ser  Date Value Ref Range Status  09/08/2023 1.00 0.57 - 1.00 mg/dL Final         Passed - K in normal range and within 180 days    Potassium  Date Value Ref Range Status  09/08/2023 3.8 3.5 - 5.2 mmol/L Final         Passed - Na in normal range and within 180 days    Sodium  Date Value Ref Range Status  09/08/2023 140 134 - 144 mmol/L Final         Passed - Last BP in normal range    BP Readings from Last 1 Encounters:  09/08/23 119/83         Passed - Valid encounter within last 6 months    Recent Outpatient Visits           2 months ago Screening for viral disease   [facility] [place] Of [place]. [facility] Delbert Clam, MD   4 months ago Patient left before evaluation by physician   [facility] Of [place]. [facility] Vicci Barnie NOVAK, MD   4 months ago Essential hypertension   [facility] [place] [facility] Dept Of [place]. [facility] Vicci Barnie NOVAK, MD   9 months ago Essential hypertension   [facility] [place] - A Dept Of [place]. [facility] Fleeta Morris, Garnette CROME, RPH-CPP   10 months ago Hypertensive urgency   [facility] [place] - A Dept Of . [facility] Foster, [place], PA-C

## 2023-12-22 ENCOUNTER — Telehealth: Payer: Self-pay

## 2023-12-22 ENCOUNTER — Ambulatory Visit: Payer: Self-pay

## 2023-12-22 VITALS — Ht 67.0 in | Wt 300.0 lb

## 2023-12-22 DIAGNOSIS — Z1211 Encounter for screening for malignant neoplasm of colon: Secondary | ICD-10-CM

## 2023-12-22 HISTORY — DX: Morbid (severe) obesity due to excess calories: E66.01

## 2023-12-22 MED ORDER — NA SULFATE-K SULFATE-MG SULF 17.5-3.13-1.6 GM/177ML PO SOLN: 1.0000 | Freq: Once | ORAL | 0 refills | Status: AC

## 2023-12-22 NOTE — Telephone Encounter (Signed)
 Call answered and completed Pre Visit

## 2023-12-22 NOTE — Telephone Encounter (Signed)
 No answer, no voicemail.  Called twice and call hangs ups an disconnects

## 2023-12-22 NOTE — Progress Notes (Signed)

## 2024-01-02 ENCOUNTER — Encounter: Payer: Self-pay | Admitting: Pediatrics

## 2024-01-02 NOTE — Progress Notes (Deleted)
 [facility] History and Physical   Primary Care Physician:  Delbert Clam, MD   Reason for Procedure:  Colorectal cancer screening  Plan:    Screening colonoscopy     HPI: Mary Mathis is a 48 y.o. female undergoing colonoscopy for colorectal cancer screening.  This is the patient's first colonoscopy.  No family history of colorectal cancer or polyps.  Patient denies current change in bowel habits or rectal bleeding at the time of this exam.   Past Medical History:  Diagnosis Date   Anxiety    Cholecystitis    Fibroids    Hypertension     Past Surgical History:  Procedure Laterality Date   CESAREAN SECTION     x2   CHOLECYSTECTOMY  05/04/2011   Procedure: LAPAROSCOPIC CHOLECYSTECTOMY;  Surgeon: Vicenta DELENA Poli, MD;  Location: [facility];  Service: General;  Laterality: N/A;   fiboroids  08/2016   surgery for fibroids   INTRAOPERATIVE CHOLANGIOGRAM  05/04/2011   Procedure: INTRAOPERATIVE CHOLANGIOGRAM;  Surgeon: Vicenta DELENA Poli, MD;  Location: [facility];  Service: General;  Laterality: N/A;   IR ANGIOGRAM PELVIS SELECTIVE OR SUPRASELECTIVE  09/29/2016   IR ANGIOGRAM PELVIS SELECTIVE OR SUPRASELECTIVE  09/29/2016   IR ANGIOGRAM SELECTIVE EACH ADDITIONAL VESSEL  09/29/2016   IR ANGIOGRAM SELECTIVE EACH ADDITIONAL VESSEL  09/29/2016   IR EMBO TUMOR ORGAN ISCHEMIA INFARCT INC GUIDE ROADMAPPING  09/29/2016   IR RADIOLOGIST EVAL & MGMT  08/11/2016   IR RADIOLOGIST EVAL & MGMT  10/21/2016   IR RADIOLOGIST EVAL & MGMT  05/04/2017   IR US  GUIDE VASC ACCESS LEFT  09/29/2016   TUBAL LIGATION     with last c-section    Prior to Admission medications   Medication Sig Start Date End Date Taking? Authorizing Provider  amLODipine  (NORVASC ) 10 MG tablet Take 1 tablet (10 mg total) by mouth daily. 09/08/23   Newlin, Enobong, MD  hydrochlorothiazide  (HYDRODIURIL ) 12.5 MG tablet Take 1 tablet (12.5 mg total) by mouth daily. 09/08/23   Newlin, Enobong, MD  valsartan  (DIOVAN ) 160 MG  tablet Take 1 tablet (160 mg total) by mouth daily. 09/08/23   Newlin, Enobong, MD  cloNIDine  (CATAPRES ) 0.1 MG tablet Take 1 tablet (0.1 mg total) by mouth at bedtime. For insomnia and hot flashes Patient not taking: Reported on 10/07/2017 02/11/17 06/12/19  Newlin, Enobong, MD    Current Outpatient Medications  Medication Sig Dispense Refill   amLODipine  (NORVASC ) 10 MG tablet Take 1 tablet (10 mg total) by mouth daily. 90 tablet 1   hydrochlorothiazide  (HYDRODIURIL ) 12.5 MG tablet Take 1 tablet (12.5 mg total) by mouth daily. 90 tablet 1   valsartan  (DIOVAN ) 160 MG tablet Take 1 tablet (160 mg total) by mouth daily. 90 tablet 1   No current facility-administered medications for this visit.   Facility-Administered Medications Ordered in Other Visits  Medication Dose Route Frequency Provider Last Rate Last Admin   gadopentetate dimeglumine  (MAGNEVIST ) injection 20 mL  20 mL Intravenous Once PRN Penumalli, Vikram R, MD        Allergies as of 01/05/2024 - Review Complete 12/22/2023  Allergen Reaction Noted   Amoxicillin Swelling and Rash 05/27/2015    Family History  Problem Relation Age of Onset   Hypertension Mother    Diabetes Father    Hypertension Father    Colon cancer Neg Hx    Colon polyps Neg Hx    Esophageal cancer Neg Hx    Stomach cancer Neg Hx    Rectal  cancer Neg Hx     Social History   Socioeconomic History   Marital status: Single    Spouse name: Not on file   Number of children: Not on file   Years of education: Not on file   Highest education level: Not on file  Occupational History   Not on file  Tobacco Use   Smoking status: Never   Smokeless tobacco: Never  Vaping Use   Vaping status: Never Used  Substance and Sexual Activity   Alcohol use: No   Drug use: No   Sexual activity: Never    Birth control/protection: None, Surgical  Other Topics Concern   Not on file  Social History Narrative   Not on file   Social Drivers of Health   Financial  Resource Strain: Patient Declined (09/08/2023)   Overall Financial Resource Strain (CARDIA)    Difficulty of Paying Living Expenses: Patient declined  Food Insecurity: Patient Declined (09/08/2023)   Hunger Vital Sign    Worried About Running Out of Food in the Last Year: Patient declined    Ran Out of Food in the Last Year: Patient declined  Transportation Needs: Patient Declined (09/08/2023)   PRAPARE - Administrator, Civil Service (Medical): Patient declined    Lack of Transportation (Non-Medical): Patient declined  Physical Activity: Patient Declined (09/08/2023)   Exercise Vital Sign    Days of Exercise per Week: Patient declined    Minutes of Exercise per Session: Patient declined  Stress: Patient Declined (09/08/2023)   [employer] of Occupational Health - Occupational Stress Questionnaire    Feeling of Stress: Patient declined  Social Connections: Patient Declined (09/08/2023)   Social Connection and Isolation Panel    Frequency of Communication with Friends and Family: Patient declined    Frequency of Social Gatherings with Friends and Family: Patient declined    Attends Religious Services: Patient declined    Database Administrator or Organizations: Patient declined    Attends Banker Meetings: Patient declined    Marital Status: Patient declined  Intimate Partner Violence: Patient Declined (09/08/2023)   Humiliation, Afraid, Rape, and Kick questionnaire    Fear of Current or Ex-Partner: Patient declined    Emotionally Abused: Patient declined    Physically Abused: Patient declined    Sexually Abused: Patient declined    Review of Systems:  All other review of systems negative except as mentioned in the HPI.  Physical Exam: Vital signs There were no vitals taken for this visit.  General:   Alert,  Well-developed, well-nourished, pleasant and cooperative in NAD Airway:  Mallampati  Lungs:  Clear throughout to auscultation.   Heart:  Regular rate  and rhythm; no murmurs, clicks, rubs,  or gallops. Abdomen:  Soft, nontender and nondistended. Normal bowel sounds.   Neuro/Psych:  Normal mood and affect. A and O x 3  Inocente Hausen, MD [facility]

## 2024-01-05 ENCOUNTER — Encounter: Payer: Self-pay | Admitting: Pediatrics

## 2024-03-15 ENCOUNTER — Ambulatory Visit: Payer: Self-pay | Admitting: Family Medicine

## 2024-05-03 ENCOUNTER — Ambulatory Visit: Payer: Self-pay | Admitting: Family Medicine
